# Patient Record
Sex: Female | Born: 1975
Health system: Southern US, Community
[De-identification: ages and names within clinical notes are randomized; demographics above are authoritative.]

## PROBLEM LIST (undated history)

## (undated) DIAGNOSIS — Z87898 Personal history of other specified conditions: Secondary | ICD-10-CM

## (undated) DIAGNOSIS — I10 Essential (primary) hypertension: Secondary | ICD-10-CM

## (undated) DIAGNOSIS — F411 Generalized anxiety disorder: Secondary | ICD-10-CM

## (undated) HISTORY — DX: Generalized anxiety disorder: F41.1

## (undated) HISTORY — DX: Personal history of other specified conditions: Z87.898

## (undated) HISTORY — PX: TUBAL LIGATION: SHX77

## (undated) HISTORY — PX: COLPOSCOPY: SHX161

## (undated) HISTORY — DX: Essential (primary) hypertension: I10

---

## 1997-03-02 ENCOUNTER — Observation Stay (HOSPITAL_COMMUNITY): Admission: AD | Admit: 1997-03-02 | Discharge: 1997-03-03 | Payer: Self-pay | Admitting: Obstetrics & Gynecology

## 1997-03-21 ENCOUNTER — Inpatient Hospital Stay (HOSPITAL_COMMUNITY): Admission: AD | Admit: 1997-03-21 | Discharge: 1997-03-21 | Payer: Self-pay | Admitting: Obstetrics

## 1997-04-09 ENCOUNTER — Encounter: Admission: RE | Admit: 1997-04-09 | Discharge: 1997-04-09 | Payer: Self-pay | Admitting: Family Medicine

## 1997-04-16 ENCOUNTER — Encounter: Admission: RE | Admit: 1997-04-16 | Discharge: 1997-04-16 | Payer: Self-pay | Admitting: Family Medicine

## 1997-04-17 ENCOUNTER — Observation Stay (HOSPITAL_COMMUNITY): Admission: AD | Admit: 1997-04-17 | Discharge: 1997-04-18 | Payer: Self-pay | Admitting: *Deleted

## 1997-04-17 ENCOUNTER — Encounter: Admission: RE | Admit: 1997-04-17 | Discharge: 1997-04-17 | Payer: Self-pay | Admitting: Family Medicine

## 1997-04-20 ENCOUNTER — Encounter: Admission: RE | Admit: 1997-04-20 | Discharge: 1997-04-20 | Payer: Self-pay | Admitting: Family Medicine

## 1997-04-21 ENCOUNTER — Inpatient Hospital Stay (HOSPITAL_COMMUNITY): Admission: AD | Admit: 1997-04-21 | Discharge: 1997-04-23 | Payer: Self-pay

## 1997-05-02 ENCOUNTER — Encounter: Admission: RE | Admit: 1997-05-02 | Discharge: 1997-05-02 | Payer: Self-pay | Admitting: Family Medicine

## 1997-05-30 ENCOUNTER — Encounter: Admission: RE | Admit: 1997-05-30 | Discharge: 1997-05-30 | Payer: Self-pay | Admitting: Family Medicine

## 1997-06-08 ENCOUNTER — Encounter: Admission: RE | Admit: 1997-06-08 | Discharge: 1997-06-08 | Payer: Self-pay | Admitting: Family Medicine

## 1997-06-18 ENCOUNTER — Encounter: Admission: RE | Admit: 1997-06-18 | Discharge: 1997-06-18 | Payer: Self-pay | Admitting: Family Medicine

## 1997-07-25 ENCOUNTER — Encounter: Admission: RE | Admit: 1997-07-25 | Discharge: 1997-07-25 | Payer: Self-pay | Admitting: Family Medicine

## 1997-08-01 ENCOUNTER — Encounter: Admission: RE | Admit: 1997-08-01 | Discharge: 1997-08-01 | Payer: Self-pay | Admitting: Family Medicine

## 1997-08-15 ENCOUNTER — Encounter: Admission: RE | Admit: 1997-08-15 | Discharge: 1997-08-15 | Payer: Self-pay | Admitting: Family Medicine

## 1997-09-18 ENCOUNTER — Encounter: Admission: RE | Admit: 1997-09-18 | Discharge: 1997-09-18 | Payer: Self-pay | Admitting: Family Medicine

## 1997-10-17 ENCOUNTER — Encounter: Admission: RE | Admit: 1997-10-17 | Discharge: 1997-10-17 | Payer: Self-pay | Admitting: Family Medicine

## 1997-10-19 ENCOUNTER — Encounter: Admission: RE | Admit: 1997-10-19 | Discharge: 1997-10-19 | Payer: Self-pay | Admitting: Family Medicine

## 1997-10-23 ENCOUNTER — Encounter: Admission: RE | Admit: 1997-10-23 | Discharge: 1997-10-23 | Payer: Self-pay | Admitting: Sports Medicine

## 1997-10-26 ENCOUNTER — Encounter: Admission: RE | Admit: 1997-10-26 | Discharge: 1997-10-26 | Payer: Self-pay | Admitting: Family Medicine

## 1997-10-31 ENCOUNTER — Encounter: Admission: RE | Admit: 1997-10-31 | Discharge: 1997-10-31 | Payer: Self-pay | Admitting: Family Medicine

## 1997-11-16 ENCOUNTER — Encounter: Admission: RE | Admit: 1997-11-16 | Discharge: 1997-11-16 | Payer: Self-pay | Admitting: Family Medicine

## 1997-12-24 ENCOUNTER — Encounter: Admission: RE | Admit: 1997-12-24 | Discharge: 1997-12-24 | Payer: Self-pay | Admitting: Sports Medicine

## 1998-01-21 ENCOUNTER — Encounter: Admission: RE | Admit: 1998-01-21 | Discharge: 1998-01-21 | Payer: Self-pay | Admitting: Family Medicine

## 1998-01-23 ENCOUNTER — Encounter: Admission: RE | Admit: 1998-01-23 | Discharge: 1998-01-23 | Payer: Self-pay | Admitting: Family Medicine

## 1998-03-04 ENCOUNTER — Encounter: Admission: RE | Admit: 1998-03-04 | Discharge: 1998-03-04 | Payer: Self-pay | Admitting: Family Medicine

## 1998-04-10 ENCOUNTER — Encounter: Admission: RE | Admit: 1998-04-10 | Discharge: 1998-04-10 | Payer: Self-pay | Admitting: Family Medicine

## 1998-04-18 ENCOUNTER — Encounter: Admission: RE | Admit: 1998-04-18 | Discharge: 1998-04-18 | Payer: Self-pay | Admitting: Family Medicine

## 1998-05-06 ENCOUNTER — Encounter: Admission: RE | Admit: 1998-05-06 | Discharge: 1998-05-06 | Payer: Self-pay | Admitting: Family Medicine

## 1998-05-07 ENCOUNTER — Encounter: Admission: RE | Admit: 1998-05-07 | Discharge: 1998-05-07 | Payer: Self-pay | Admitting: Sports Medicine

## 1998-05-14 ENCOUNTER — Encounter: Admission: RE | Admit: 1998-05-14 | Discharge: 1998-05-14 | Payer: Self-pay | Admitting: Family Medicine

## 1998-05-28 ENCOUNTER — Encounter: Admission: RE | Admit: 1998-05-28 | Discharge: 1998-05-28 | Payer: Self-pay | Admitting: Sports Medicine

## 1998-06-04 ENCOUNTER — Encounter: Admission: RE | Admit: 1998-06-04 | Discharge: 1998-06-04 | Payer: Self-pay | Admitting: Family Medicine

## 1998-07-03 ENCOUNTER — Encounter: Admission: RE | Admit: 1998-07-03 | Discharge: 1998-07-03 | Payer: Self-pay | Admitting: Sports Medicine

## 1998-07-29 ENCOUNTER — Encounter: Admission: RE | Admit: 1998-07-29 | Discharge: 1998-07-29 | Payer: Self-pay | Admitting: Family Medicine

## 1998-07-30 ENCOUNTER — Encounter: Admission: RE | Admit: 1998-07-30 | Discharge: 1998-07-30 | Payer: Self-pay | Admitting: Family Medicine

## 1998-08-12 ENCOUNTER — Encounter: Admission: RE | Admit: 1998-08-12 | Discharge: 1998-08-12 | Payer: Self-pay | Admitting: Family Medicine

## 1998-08-14 ENCOUNTER — Encounter: Admission: RE | Admit: 1998-08-14 | Discharge: 1998-08-14 | Payer: Self-pay | Admitting: Family Medicine

## 1998-08-22 ENCOUNTER — Encounter: Admission: RE | Admit: 1998-08-22 | Discharge: 1998-08-22 | Payer: Self-pay | Admitting: Family Medicine

## 1998-09-01 ENCOUNTER — Encounter: Payer: Self-pay | Admitting: Emergency Medicine

## 1998-09-01 ENCOUNTER — Emergency Department (HOSPITAL_COMMUNITY): Admission: EM | Admit: 1998-09-01 | Discharge: 1998-09-01 | Payer: Self-pay | Admitting: Emergency Medicine

## 1998-09-11 ENCOUNTER — Encounter: Admission: RE | Admit: 1998-09-11 | Discharge: 1998-09-11 | Payer: Self-pay | Admitting: Family Medicine

## 1998-09-24 ENCOUNTER — Encounter: Admission: RE | Admit: 1998-09-24 | Discharge: 1998-09-24 | Payer: Self-pay | Admitting: Sports Medicine

## 1998-10-15 ENCOUNTER — Encounter: Admission: RE | Admit: 1998-10-15 | Discharge: 1998-10-15 | Payer: Self-pay | Admitting: Family Medicine

## 1998-10-18 ENCOUNTER — Encounter: Admission: RE | Admit: 1998-10-18 | Discharge: 1998-10-18 | Payer: Self-pay | Admitting: Family Medicine

## 1998-11-15 ENCOUNTER — Encounter: Admission: RE | Admit: 1998-11-15 | Discharge: 1998-11-15 | Payer: Self-pay | Admitting: Family Medicine

## 1998-12-05 ENCOUNTER — Encounter: Admission: RE | Admit: 1998-12-05 | Discharge: 1998-12-05 | Payer: Self-pay | Admitting: Family Medicine

## 1999-01-02 ENCOUNTER — Encounter: Admission: RE | Admit: 1999-01-02 | Discharge: 1999-01-02 | Payer: Self-pay | Admitting: Family Medicine

## 1999-01-23 ENCOUNTER — Encounter: Admission: RE | Admit: 1999-01-23 | Discharge: 1999-01-23 | Payer: Self-pay | Admitting: Family Medicine

## 1999-02-05 ENCOUNTER — Encounter: Admission: RE | Admit: 1999-02-05 | Discharge: 1999-02-05 | Payer: Self-pay | Admitting: Family Medicine

## 1999-03-09 ENCOUNTER — Inpatient Hospital Stay (HOSPITAL_COMMUNITY): Admission: AD | Admit: 1999-03-09 | Discharge: 1999-03-09 | Payer: Self-pay | Admitting: Obstetrics

## 1999-04-04 ENCOUNTER — Inpatient Hospital Stay (HOSPITAL_COMMUNITY): Admission: EM | Admit: 1999-04-04 | Discharge: 1999-04-04 | Payer: Self-pay | Admitting: Obstetrics

## 1999-07-03 ENCOUNTER — Inpatient Hospital Stay (HOSPITAL_COMMUNITY): Admission: EM | Admit: 1999-07-03 | Discharge: 1999-07-03 | Payer: Self-pay | Admitting: Family Medicine

## 2000-03-31 ENCOUNTER — Other Ambulatory Visit: Admission: RE | Admit: 2000-03-31 | Discharge: 2000-03-31 | Payer: Self-pay | Admitting: Family Medicine

## 2001-04-16 ENCOUNTER — Inpatient Hospital Stay (HOSPITAL_COMMUNITY): Admission: AD | Admit: 2001-04-16 | Discharge: 2001-04-16 | Payer: Self-pay | Admitting: Obstetrics and Gynecology

## 2002-05-20 ENCOUNTER — Inpatient Hospital Stay (HOSPITAL_COMMUNITY): Admission: AD | Admit: 2002-05-20 | Discharge: 2002-05-20 | Payer: Self-pay | Admitting: Obstetrics and Gynecology

## 2002-07-14 ENCOUNTER — Inpatient Hospital Stay (HOSPITAL_COMMUNITY): Admission: AD | Admit: 2002-07-14 | Discharge: 2002-07-14 | Payer: Self-pay | Admitting: Family Medicine

## 2002-07-14 ENCOUNTER — Encounter: Payer: Self-pay | Admitting: Family Medicine

## 2002-09-20 ENCOUNTER — Encounter: Payer: Self-pay | Admitting: Emergency Medicine

## 2002-09-20 ENCOUNTER — Emergency Department (HOSPITAL_COMMUNITY): Admission: EM | Admit: 2002-09-20 | Discharge: 2002-09-20 | Payer: Self-pay | Admitting: Emergency Medicine

## 2002-10-09 ENCOUNTER — Ambulatory Visit (HOSPITAL_COMMUNITY): Admission: RE | Admit: 2002-10-09 | Discharge: 2002-10-09 | Payer: Self-pay | Admitting: Obstetrics and Gynecology

## 2002-11-05 ENCOUNTER — Emergency Department (HOSPITAL_COMMUNITY): Admission: AD | Admit: 2002-11-05 | Discharge: 2002-11-05 | Payer: Self-pay | Admitting: Emergency Medicine

## 2002-12-11 ENCOUNTER — Ambulatory Visit (HOSPITAL_COMMUNITY): Admission: RE | Admit: 2002-12-11 | Discharge: 2002-12-11 | Payer: Self-pay | Admitting: *Deleted

## 2002-12-16 ENCOUNTER — Inpatient Hospital Stay (HOSPITAL_COMMUNITY): Admission: AD | Admit: 2002-12-16 | Discharge: 2002-12-16 | Payer: Self-pay | Admitting: *Deleted

## 2002-12-30 ENCOUNTER — Inpatient Hospital Stay (HOSPITAL_COMMUNITY): Admission: AD | Admit: 2002-12-30 | Discharge: 2003-01-03 | Payer: Self-pay | Admitting: Obstetrics and Gynecology

## 2003-01-17 ENCOUNTER — Encounter: Admission: RE | Admit: 2003-01-17 | Discharge: 2003-01-17 | Payer: Self-pay | Admitting: *Deleted

## 2003-02-01 ENCOUNTER — Encounter: Admission: RE | Admit: 2003-02-01 | Discharge: 2003-02-01 | Payer: Self-pay | Admitting: *Deleted

## 2003-02-02 ENCOUNTER — Ambulatory Visit (HOSPITAL_COMMUNITY): Admission: RE | Admit: 2003-02-02 | Discharge: 2003-02-02 | Payer: Self-pay | Admitting: *Deleted

## 2003-02-15 ENCOUNTER — Encounter: Admission: RE | Admit: 2003-02-15 | Discharge: 2003-02-15 | Payer: Self-pay | Admitting: *Deleted

## 2003-02-15 ENCOUNTER — Inpatient Hospital Stay (HOSPITAL_COMMUNITY): Admission: AD | Admit: 2003-02-15 | Discharge: 2003-02-15 | Payer: Self-pay | Admitting: Obstetrics & Gynecology

## 2003-02-17 ENCOUNTER — Observation Stay (HOSPITAL_COMMUNITY): Admission: AD | Admit: 2003-02-17 | Discharge: 2003-02-18 | Payer: Self-pay | Admitting: *Deleted

## 2003-02-22 ENCOUNTER — Encounter: Admission: RE | Admit: 2003-02-22 | Discharge: 2003-02-22 | Payer: Self-pay | Admitting: Family Medicine

## 2003-02-24 ENCOUNTER — Observation Stay (HOSPITAL_COMMUNITY): Admission: AD | Admit: 2003-02-24 | Discharge: 2003-02-25 | Payer: Self-pay | Admitting: Obstetrics & Gynecology

## 2003-02-26 ENCOUNTER — Inpatient Hospital Stay (HOSPITAL_COMMUNITY): Admission: AD | Admit: 2003-02-26 | Discharge: 2003-02-26 | Payer: Self-pay | Admitting: Obstetrics & Gynecology

## 2003-02-26 ENCOUNTER — Encounter: Admission: RE | Admit: 2003-02-26 | Discharge: 2003-02-26 | Payer: Self-pay | Admitting: *Deleted

## 2003-03-01 ENCOUNTER — Encounter: Admission: RE | Admit: 2003-03-01 | Discharge: 2003-03-01 | Payer: Self-pay | Admitting: *Deleted

## 2003-03-02 ENCOUNTER — Inpatient Hospital Stay (HOSPITAL_COMMUNITY): Admission: AD | Admit: 2003-03-02 | Discharge: 2003-03-05 | Payer: Self-pay | Admitting: *Deleted

## 2003-03-04 ENCOUNTER — Encounter (INDEPENDENT_AMBULATORY_CARE_PROVIDER_SITE_OTHER): Payer: Self-pay | Admitting: Specialist

## 2003-03-10 ENCOUNTER — Emergency Department (HOSPITAL_COMMUNITY): Admission: EM | Admit: 2003-03-10 | Discharge: 2003-03-11 | Payer: Self-pay | Admitting: Emergency Medicine

## 2003-04-06 ENCOUNTER — Encounter (INDEPENDENT_AMBULATORY_CARE_PROVIDER_SITE_OTHER): Payer: Self-pay | Admitting: *Deleted

## 2003-04-06 LAB — CONVERTED CEMR LAB

## 2003-06-11 ENCOUNTER — Encounter: Admission: RE | Admit: 2003-06-11 | Discharge: 2003-06-11 | Payer: Self-pay | Admitting: Family Medicine

## 2003-06-23 ENCOUNTER — Emergency Department (HOSPITAL_COMMUNITY): Admission: EM | Admit: 2003-06-23 | Discharge: 2003-06-23 | Payer: Self-pay | Admitting: Family Medicine

## 2003-09-20 ENCOUNTER — Ambulatory Visit: Payer: Self-pay | Admitting: Family Medicine

## 2003-10-11 ENCOUNTER — Ambulatory Visit: Payer: Self-pay | Admitting: Family Medicine

## 2003-10-17 ENCOUNTER — Ambulatory Visit: Payer: Self-pay | Admitting: Family Medicine

## 2003-12-23 ENCOUNTER — Emergency Department (HOSPITAL_COMMUNITY): Admission: EM | Admit: 2003-12-23 | Discharge: 2003-12-23 | Payer: Self-pay | Admitting: Family Medicine

## 2004-04-10 ENCOUNTER — Ambulatory Visit: Payer: Self-pay | Admitting: Sports Medicine

## 2004-05-16 ENCOUNTER — Ambulatory Visit: Payer: Self-pay | Admitting: Family Medicine

## 2004-05-21 ENCOUNTER — Ambulatory Visit: Payer: Self-pay | Admitting: Family Medicine

## 2004-05-21 ENCOUNTER — Encounter: Admission: RE | Admit: 2004-05-21 | Discharge: 2004-05-21 | Payer: Self-pay | Admitting: Sports Medicine

## 2004-08-07 ENCOUNTER — Ambulatory Visit: Payer: Self-pay | Admitting: Family Medicine

## 2004-09-16 ENCOUNTER — Encounter: Admission: RE | Admit: 2004-09-16 | Discharge: 2004-09-16 | Payer: Self-pay | Admitting: Occupational Medicine

## 2004-10-15 ENCOUNTER — Ambulatory Visit: Payer: Self-pay | Admitting: Family Medicine

## 2005-01-12 ENCOUNTER — Ambulatory Visit: Payer: Self-pay | Admitting: Sports Medicine

## 2005-01-16 ENCOUNTER — Encounter: Admission: RE | Admit: 2005-01-16 | Discharge: 2005-01-16 | Payer: Self-pay | Admitting: Sports Medicine

## 2005-04-08 ENCOUNTER — Ambulatory Visit: Payer: Self-pay | Admitting: Family Medicine

## 2005-04-15 ENCOUNTER — Ambulatory Visit: Payer: Self-pay | Admitting: Family Medicine

## 2005-05-18 ENCOUNTER — Ambulatory Visit: Payer: Self-pay | Admitting: Sports Medicine

## 2005-06-23 ENCOUNTER — Ambulatory Visit: Payer: Self-pay | Admitting: Sports Medicine

## 2005-09-22 ENCOUNTER — Ambulatory Visit: Payer: Self-pay | Admitting: Sports Medicine

## 2005-10-05 ENCOUNTER — Ambulatory Visit: Payer: Self-pay | Admitting: Family Medicine

## 2006-01-08 ENCOUNTER — Ambulatory Visit: Payer: Self-pay | Admitting: Family Medicine

## 2006-01-20 ENCOUNTER — Ambulatory Visit: Payer: Self-pay | Admitting: Sports Medicine

## 2006-03-04 DIAGNOSIS — Z87891 Personal history of nicotine dependence: Secondary | ICD-10-CM | POA: Insufficient documentation

## 2006-03-04 DIAGNOSIS — D649 Anemia, unspecified: Secondary | ICD-10-CM | POA: Insufficient documentation

## 2006-03-04 DIAGNOSIS — J309 Allergic rhinitis, unspecified: Secondary | ICD-10-CM | POA: Insufficient documentation

## 2006-03-04 DIAGNOSIS — E669 Obesity, unspecified: Secondary | ICD-10-CM | POA: Insufficient documentation

## 2006-03-04 DIAGNOSIS — I10 Essential (primary) hypertension: Secondary | ICD-10-CM | POA: Insufficient documentation

## 2006-03-05 ENCOUNTER — Encounter (INDEPENDENT_AMBULATORY_CARE_PROVIDER_SITE_OTHER): Payer: Self-pay | Admitting: *Deleted

## 2006-04-19 ENCOUNTER — Telehealth: Payer: Self-pay | Admitting: *Deleted

## 2006-04-30 ENCOUNTER — Ambulatory Visit: Payer: Self-pay | Admitting: Family Medicine

## 2006-04-30 ENCOUNTER — Telehealth (INDEPENDENT_AMBULATORY_CARE_PROVIDER_SITE_OTHER): Payer: Self-pay | Admitting: *Deleted

## 2006-04-30 DIAGNOSIS — L732 Hidradenitis suppurativa: Secondary | ICD-10-CM | POA: Insufficient documentation

## 2006-04-30 LAB — CONVERTED CEMR LAB

## 2006-08-16 ENCOUNTER — Encounter: Payer: Self-pay | Admitting: Family Medicine

## 2006-08-16 ENCOUNTER — Ambulatory Visit: Payer: Self-pay | Admitting: Family Medicine

## 2006-08-16 ENCOUNTER — Telehealth: Payer: Self-pay | Admitting: *Deleted

## 2006-08-16 DIAGNOSIS — A6000 Herpesviral infection of urogenital system, unspecified: Secondary | ICD-10-CM | POA: Insufficient documentation

## 2006-08-16 LAB — CONVERTED CEMR LAB
KOH Prep: NEGATIVE
Whiff Test: NEGATIVE

## 2006-08-19 LAB — CONVERTED CEMR LAB: GC Probe Amp, Genital: NEGATIVE

## 2006-08-22 ENCOUNTER — Telehealth: Payer: Self-pay | Admitting: Family Medicine

## 2006-08-22 ENCOUNTER — Emergency Department (HOSPITAL_COMMUNITY): Admission: EM | Admit: 2006-08-22 | Discharge: 2006-08-22 | Payer: Self-pay | Admitting: Emergency Medicine

## 2006-08-24 ENCOUNTER — Ambulatory Visit: Payer: Self-pay | Admitting: Family Medicine

## 2006-08-24 LAB — CONVERTED CEMR LAB: Whiff Test: NEGATIVE

## 2006-09-28 ENCOUNTER — Telehealth: Payer: Self-pay | Admitting: Family Medicine

## 2006-10-14 ENCOUNTER — Ambulatory Visit: Payer: Self-pay | Admitting: Family Medicine

## 2006-11-22 ENCOUNTER — Ambulatory Visit: Payer: Self-pay | Admitting: Family Medicine

## 2007-02-16 ENCOUNTER — Ambulatory Visit: Payer: Self-pay | Admitting: Family Medicine

## 2007-02-16 ENCOUNTER — Encounter (INDEPENDENT_AMBULATORY_CARE_PROVIDER_SITE_OTHER): Payer: Self-pay | Admitting: Family Medicine

## 2007-02-16 ENCOUNTER — Telehealth: Payer: Self-pay | Admitting: *Deleted

## 2007-02-16 LAB — CONVERTED CEMR LAB: Heterophile Ab Screen: NEGATIVE

## 2007-02-17 LAB — CONVERTED CEMR LAB
Basophils Absolute: 0 10*3/uL (ref 0.0–0.1)
Calcium: 9.5 mg/dL (ref 8.4–10.5)
Eosinophils Absolute: 0.2 10*3/uL (ref 0.0–0.7)
Eosinophils Relative: 5 % (ref 0–5)
HCT: 37.6 % (ref 36.0–46.0)
MCV: 94 fL (ref 78.0–100.0)
Neutrophils Relative %: 53 % (ref 43–77)
Platelets: 261 10*3/uL (ref 150–400)
Potassium: 3.6 meq/L (ref 3.5–5.3)
RDW: 13.3 % (ref 11.5–15.5)
Sodium: 140 meq/L (ref 135–145)
TSH: 0.514 microintl units/mL (ref 0.350–5.50)

## 2007-03-23 ENCOUNTER — Telehealth: Payer: Self-pay | Admitting: Family Medicine

## 2007-05-02 ENCOUNTER — Ambulatory Visit: Payer: Self-pay | Admitting: Family Medicine

## 2007-05-02 DIAGNOSIS — F411 Generalized anxiety disorder: Secondary | ICD-10-CM | POA: Insufficient documentation

## 2007-05-23 ENCOUNTER — Encounter: Payer: Self-pay | Admitting: Family Medicine

## 2007-05-23 ENCOUNTER — Telehealth: Payer: Self-pay | Admitting: *Deleted

## 2007-05-23 ENCOUNTER — Ambulatory Visit: Payer: Self-pay | Admitting: Family Medicine

## 2007-05-23 LAB — CONVERTED CEMR LAB
Bilirubin Urine: NEGATIVE
Glucose, Urine, Semiquant: NEGATIVE
Ketones, urine, test strip: NEGATIVE
pH: 8

## 2007-05-24 LAB — CONVERTED CEMR LAB
Chlamydia, DNA Probe: NEGATIVE
GC Probe Amp, Genital: NEGATIVE

## 2007-05-27 ENCOUNTER — Telehealth: Payer: Self-pay | Admitting: Family Medicine

## 2007-05-27 ENCOUNTER — Ambulatory Visit (HOSPITAL_COMMUNITY): Admission: RE | Admit: 2007-05-27 | Discharge: 2007-05-27 | Payer: Self-pay | Admitting: Family Medicine

## 2007-06-06 ENCOUNTER — Telehealth: Payer: Self-pay | Admitting: Family Medicine

## 2007-06-21 ENCOUNTER — Ambulatory Visit: Payer: Self-pay | Admitting: Family Medicine

## 2007-07-11 ENCOUNTER — Ambulatory Visit: Payer: Self-pay | Admitting: Family Medicine

## 2007-07-11 LAB — CONVERTED CEMR LAB
Blood in Urine, dipstick: NEGATIVE
Nitrite: NEGATIVE
Specific Gravity, Urine: 1.02
WBC Urine, dipstick: NEGATIVE

## 2007-09-28 ENCOUNTER — Ambulatory Visit: Payer: Self-pay | Admitting: Family Medicine

## 2007-12-08 ENCOUNTER — Telehealth: Payer: Self-pay | Admitting: *Deleted

## 2007-12-09 ENCOUNTER — Encounter (INDEPENDENT_AMBULATORY_CARE_PROVIDER_SITE_OTHER): Payer: Self-pay | Admitting: Family Medicine

## 2007-12-09 ENCOUNTER — Ambulatory Visit: Payer: Self-pay | Admitting: Family Medicine

## 2007-12-09 LAB — CONVERTED CEMR LAB: Chlamydia, DNA Probe: NEGATIVE

## 2007-12-13 ENCOUNTER — Encounter (INDEPENDENT_AMBULATORY_CARE_PROVIDER_SITE_OTHER): Payer: Self-pay | Admitting: Family Medicine

## 2007-12-20 ENCOUNTER — Ambulatory Visit: Payer: Self-pay | Admitting: Family Medicine

## 2007-12-20 ENCOUNTER — Encounter (INDEPENDENT_AMBULATORY_CARE_PROVIDER_SITE_OTHER): Payer: Self-pay | Admitting: Family Medicine

## 2007-12-20 LAB — CONVERTED CEMR LAB
AST: 16 units/L (ref 0–37)
Albumin: 4.2 g/dL (ref 3.5–5.2)
BUN: 11 mg/dL (ref 6–23)
CO2: 20 meq/L (ref 19–32)
Calcium: 8.8 mg/dL (ref 8.4–10.5)
Chloride: 105 meq/L (ref 96–112)
HDL: 49 mg/dL (ref >39)
Potassium: 4.2 meq/L (ref 3.5–5.3)

## 2007-12-21 ENCOUNTER — Encounter (INDEPENDENT_AMBULATORY_CARE_PROVIDER_SITE_OTHER): Payer: Self-pay | Admitting: Family Medicine

## 2008-01-16 ENCOUNTER — Ambulatory Visit: Payer: Self-pay | Admitting: Family Medicine

## 2008-01-19 ENCOUNTER — Telehealth: Payer: Self-pay | Admitting: *Deleted

## 2008-01-19 ENCOUNTER — Encounter (INDEPENDENT_AMBULATORY_CARE_PROVIDER_SITE_OTHER): Payer: Self-pay | Admitting: Family Medicine

## 2008-01-19 ENCOUNTER — Ambulatory Visit: Payer: Self-pay | Admitting: Family Medicine

## 2008-01-31 ENCOUNTER — Encounter (INDEPENDENT_AMBULATORY_CARE_PROVIDER_SITE_OTHER): Payer: Self-pay | Admitting: Family Medicine

## 2008-02-21 ENCOUNTER — Ambulatory Visit: Payer: Self-pay | Admitting: Family Medicine

## 2008-02-21 DIAGNOSIS — B351 Tinea unguium: Secondary | ICD-10-CM | POA: Insufficient documentation

## 2008-02-29 ENCOUNTER — Telehealth (INDEPENDENT_AMBULATORY_CARE_PROVIDER_SITE_OTHER): Payer: Self-pay | Admitting: Family Medicine

## 2008-03-06 ENCOUNTER — Telehealth: Payer: Self-pay | Admitting: *Deleted

## 2008-03-26 ENCOUNTER — Ambulatory Visit: Payer: Self-pay | Admitting: Family Medicine

## 2008-03-26 LAB — CONVERTED CEMR LAB: Rapid Strep: NEGATIVE

## 2008-05-15 ENCOUNTER — Telehealth (INDEPENDENT_AMBULATORY_CARE_PROVIDER_SITE_OTHER): Payer: Self-pay | Admitting: Family Medicine

## 2008-05-15 ENCOUNTER — Ambulatory Visit: Payer: Self-pay | Admitting: Family Medicine

## 2008-05-15 ENCOUNTER — Encounter (INDEPENDENT_AMBULATORY_CARE_PROVIDER_SITE_OTHER): Payer: Self-pay | Admitting: Family Medicine

## 2008-05-15 LAB — CONVERTED CEMR LAB
Glucose, Urine, Semiquant: NEGATIVE
Ketones, urine, test strip: NEGATIVE
Specific Gravity, Urine: 1.02
pH: 8

## 2008-06-14 ENCOUNTER — Encounter (INDEPENDENT_AMBULATORY_CARE_PROVIDER_SITE_OTHER): Payer: Self-pay | Admitting: Family Medicine

## 2008-09-25 ENCOUNTER — Encounter: Payer: Self-pay | Admitting: Family Medicine

## 2008-09-25 ENCOUNTER — Telehealth (INDEPENDENT_AMBULATORY_CARE_PROVIDER_SITE_OTHER): Payer: Self-pay | Admitting: *Deleted

## 2008-09-25 ENCOUNTER — Ambulatory Visit: Payer: Self-pay | Admitting: Family Medicine

## 2008-09-25 DIAGNOSIS — N83209 Unspecified ovarian cyst, unspecified side: Secondary | ICD-10-CM | POA: Insufficient documentation

## 2008-09-25 DIAGNOSIS — R809 Proteinuria, unspecified: Secondary | ICD-10-CM | POA: Insufficient documentation

## 2008-09-25 DIAGNOSIS — R1031 Right lower quadrant pain: Secondary | ICD-10-CM | POA: Insufficient documentation

## 2008-09-25 LAB — CONVERTED CEMR LAB
Beta hcg, urine, semiquantitative: NEGATIVE
Bilirubin Urine: NEGATIVE
Blood in Urine, dipstick: NEGATIVE
Chlamydia, DNA Probe: NEGATIVE
GC Probe Amp, Genital: NEGATIVE
Ketones, urine, test strip: NEGATIVE
Urobilinogen, UA: 0.2
Whiff Test: NEGATIVE

## 2008-10-01 ENCOUNTER — Ambulatory Visit (HOSPITAL_COMMUNITY): Admission: RE | Admit: 2008-10-01 | Discharge: 2008-10-01 | Payer: Self-pay | Admitting: Family Medicine

## 2008-10-09 ENCOUNTER — Telehealth: Payer: Self-pay | Admitting: Family Medicine

## 2008-10-11 ENCOUNTER — Ambulatory Visit: Payer: Self-pay | Admitting: Family Medicine

## 2008-10-15 ENCOUNTER — Telehealth: Payer: Self-pay | Admitting: *Deleted

## 2008-10-16 ENCOUNTER — Ambulatory Visit: Payer: Self-pay | Admitting: Family Medicine

## 2008-10-18 ENCOUNTER — Ambulatory Visit: Payer: Self-pay | Admitting: Family Medicine

## 2009-01-25 ENCOUNTER — Ambulatory Visit: Payer: Self-pay | Admitting: Family Medicine

## 2009-01-25 ENCOUNTER — Encounter: Payer: Self-pay | Admitting: Family Medicine

## 2009-01-25 LAB — CONVERTED CEMR LAB: GC Probe Amp, Genital: NEGATIVE

## 2009-01-28 ENCOUNTER — Encounter: Payer: Self-pay | Admitting: Family Medicine

## 2009-02-07 ENCOUNTER — Telehealth: Payer: Self-pay | Admitting: Family Medicine

## 2009-03-27 ENCOUNTER — Encounter: Payer: Self-pay | Admitting: Family Medicine

## 2009-03-27 ENCOUNTER — Ambulatory Visit: Payer: Self-pay | Admitting: Family Medicine

## 2009-03-27 LAB — CONVERTED CEMR LAB
GC Probe Amp, Genital: NEGATIVE
Rapid Strep: NEGATIVE

## 2009-04-15 ENCOUNTER — Emergency Department (HOSPITAL_COMMUNITY): Admission: EM | Admit: 2009-04-15 | Discharge: 2009-04-15 | Payer: Self-pay | Admitting: Family Medicine

## 2009-05-05 ENCOUNTER — Emergency Department (HOSPITAL_COMMUNITY): Admission: EM | Admit: 2009-05-05 | Discharge: 2009-05-05 | Payer: Self-pay | Admitting: Emergency Medicine

## 2009-07-02 ENCOUNTER — Ambulatory Visit: Payer: Self-pay | Admitting: Family Medicine

## 2009-07-02 ENCOUNTER — Encounter (INDEPENDENT_AMBULATORY_CARE_PROVIDER_SITE_OTHER): Payer: Self-pay | Admitting: Family Medicine

## 2009-07-02 LAB — CONVERTED CEMR LAB
ALT: 18 units/L (ref 0–35)
AST: 19 units/L (ref 0–37)
Chloride: 104 meq/L (ref 96–112)
Creatinine, Ser: 0.74 mg/dL (ref 0.40–1.20)
HCT: 35.4 % — ABNORMAL LOW (ref 36.0–46.0)
Platelets: 264 10*3/uL (ref 150–400)
RDW: 13.6 % (ref 11.5–15.5)
Total Bilirubin: 0.3 mg/dL (ref 0.3–1.2)

## 2009-07-16 ENCOUNTER — Encounter: Payer: Self-pay | Admitting: Sports Medicine

## 2009-07-16 ENCOUNTER — Ambulatory Visit: Payer: Self-pay | Admitting: Family Medicine

## 2009-07-16 LAB — CONVERTED CEMR LAB
Bilirubin Urine: NEGATIVE
Glucose, Urine, Semiquant: NEGATIVE
Ketones, urine, test strip: NEGATIVE
Nitrite: NEGATIVE
Protein, U semiquant: 30
Specific Gravity, Urine: >=1.03
Urobilinogen, UA: 0.2
pH: 6

## 2009-07-18 ENCOUNTER — Telehealth: Payer: Self-pay | Admitting: *Deleted

## 2009-07-25 ENCOUNTER — Ambulatory Visit: Payer: Self-pay | Admitting: Family Medicine

## 2009-07-25 LAB — CONVERTED CEMR LAB
Bilirubin Urine: NEGATIVE
Glucose, Urine, Semiquant: NEGATIVE
Ketones, urine, test strip: NEGATIVE
Nitrite: NEGATIVE
Protein, U semiquant: 30
Specific Gravity, Urine: 1.025
Urobilinogen, UA: 1
WBC Urine, dipstick: NEGATIVE
Whiff Test: NEGATIVE
pH: 6

## 2009-07-29 ENCOUNTER — Ambulatory Visit (HOSPITAL_COMMUNITY): Admission: RE | Admit: 2009-07-29 | Discharge: 2009-07-29 | Payer: Self-pay | Admitting: Sports Medicine

## 2009-08-05 ENCOUNTER — Telehealth: Payer: Self-pay | Admitting: Family Medicine

## 2009-08-08 ENCOUNTER — Ambulatory Visit: Payer: Self-pay | Admitting: Family Medicine

## 2009-08-08 DIAGNOSIS — R42 Dizziness and giddiness: Secondary | ICD-10-CM | POA: Insufficient documentation

## 2009-08-08 LAB — CONVERTED CEMR LAB
BUN: 11 mg/dL (ref 6–23)
Bilirubin Urine: NEGATIVE
Calcium: 9.5 mg/dL (ref 8.4–10.5)
Creatinine, Ser: 0.87 mg/dL (ref 0.40–1.20)
Hemoglobin: 11.6 g/dL — ABNORMAL LOW (ref 12.0–15.0)
MCHC: 34 g/dL (ref 30.0–36.0)
MCV: 93.9 fL (ref 78.0–100.0)
Potassium: 3.5 meq/L (ref 3.5–5.3)
Protein, U semiquant: 30
RDW: 13.6 % (ref 11.5–15.5)
TSH: 0.607 microintl units/mL (ref 0.350–4.500)
Urobilinogen, UA: 1
pH: 5.5

## 2009-10-31 ENCOUNTER — Emergency Department (HOSPITAL_COMMUNITY): Admission: EM | Admit: 2009-10-31 | Discharge: 2009-10-31 | Payer: Self-pay | Admitting: Family Medicine

## 2009-10-31 ENCOUNTER — Ambulatory Visit: Payer: Self-pay | Admitting: Family Medicine

## 2009-11-20 ENCOUNTER — Emergency Department (HOSPITAL_COMMUNITY)
Admission: EM | Admit: 2009-11-20 | Discharge: 2009-11-21 | Payer: Self-pay | Source: Home / Self Care | Admitting: Emergency Medicine

## 2009-11-20 ENCOUNTER — Emergency Department (HOSPITAL_COMMUNITY)
Admission: EM | Admit: 2009-11-20 | Discharge: 2009-11-20 | Disposition: A | Discharge status: Other Acute Inpatient Hospital | Payer: Self-pay | Source: Home / Self Care | Admitting: Family Medicine

## 2009-11-22 ENCOUNTER — Ambulatory Visit: Payer: Self-pay | Admitting: Family Medicine

## 2009-11-22 DIAGNOSIS — F3289 Other specified depressive episodes: Secondary | ICD-10-CM | POA: Insufficient documentation

## 2009-11-22 DIAGNOSIS — F329 Major depressive disorder, single episode, unspecified: Secondary | ICD-10-CM | POA: Insufficient documentation

## 2010-02-04 NOTE — Assessment & Plan Note (Signed)
Summary: recheck urine for blood/walden,tcb   Vital Signs:  Patient profile:   35 year old female Height:      68.5 inches Weight:      260 pounds BMI:     39.10 Temp:     99.0 degrees F oral Pulse rate:   94 / minute BP sitting:   131 / 83  (left arm) Cuff size:   large  Vitals Entered By: Tessie Fass CMA (August 08, 2009 8:37 AM) CC: recheck for hematuria Is Patient Diabetic? No Pain Assessment Patient in pain? no        Primary Care Provider:  Renold Don MD  CC:  recheck for hematuria.  History of Present Illness: Pt reports she is walking 30 minutes a day and has lost 6 lbs.  Was walking yesterday and felt faint.  Also if she stands quickly after bending, then she feels dizzy.  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   She is trying to lose weight.  Drinking more water.  Less soda and sugary foods.  Less sodium. Walking. Has regular menses.  No heavy bleeding.  s/p BTL. Pt reports she never finished the Keflex, but she is doing fine without urinary complaints.  No dysruia, no gross hematuria.    Habits & Providers  Alcohol-Tobacco-Diet     Tobacco Status: quit > 6 months     Tobacco Counseling: to remain off tobacco products     Year Quit: 2010  Current Medications (verified): 1)  Valtrex 500 Mg Tabs (Valacyclovir Hcl) .Marland Kitchen.. 1 Tablet By Mouth Daily For Suppression and 1 Tablet By Mouth Two Times A Day X 3 Days For Outbreaks 2)  Lisinopril-Hydrochlorothiazide 20-25 Mg Tabs (Lisinopril-Hydrochlorothiazide) .... One Tab By Mouth Daily 3)  Fluconazole 150 Mg Tabs (Fluconazole) .... One Tab By Mouth Q3d X 2, Then By Mouth Q Weekly X 6 Months.  Allergies (verified): No Known Drug Allergies  Social History: Smoking Status:  quit > 6 months  Review of Systems       see HPI  Physical Exam  General:  Obese,in no acute distress; alert,appropriate and cooperative throughout examination Head:  Normocephalic and atraumatic without  obvious abnormalities. No apparent alopecia or balding. Eyes:  Pale conjunctiva Lungs:  Normal respiratory effort, chest expands symmetrically. Lungs are clear to auscultation, no crackles or wheezes. Heart:  Normal rate and regular rhythm. S1 and S2 normal without gallop, murmur, click, rub or other extra sounds. Neurologic:  CN II-XII intact except VF not assessed.  Strength 5/5, DTRs 2+ throughout without clonus.  Nl finger-nose, RAM, nl heel/toe/tandem gait.  Neg Romberg, no pronator drift.     Impression & Recommendations:  Problem # 1:  ORTHOSTATIC DIZZINESS (ICD-780.4) Check labs today. May be simply due to dietary changes and the heat.  If labs normal, then will reassure. The following medications were removed from the medication list:    Claritin 10 Mg Tabs (Loratadine) ..... Once daily as needed allergy  Orders: Basic Met-[facility] [phone]) CBC-[facility] (46962) TSH-[facility] [phone]) [facility]- Est Level  3 (01027)  Problem # 2:  MICROSCOPIC HEMATURIA (ICD-599.72)  Resolved.   The following medications were removed from the medication list:    Keflex 500 Mg Caps (Cephalexin) ..... One tab po two times a day x 7d    Metronidazole 500 Mg Tabs (Metronidazole) ..... One tab by mouth two times a day x 7d  Orders: Urinalysis-[facility] (00000) [facility]- Est Level  3 (25366)  Complete Medication List: 1)  Valtrex 500 Mg Tabs (Valacyclovir hcl) .Marland Kitchen.. 1 tablet by mouth daily for suppression and 1 tablet by mouth two times a day x 3 days for outbreaks 2)  Lisinopril-hydrochlorothiazide 20-25 Mg Tabs (Lisinopril-hydrochlorothiazide) .... One tab by mouth daily 3)  Fluconazole 150 Mg Tabs (Fluconazole) .... One tab by mouth q3d x 2, then by mouth q weekly x 6 months.  Other Orders: U Preg-[facility] ([address])  Patient Instructions: 1)  Congratulations on the weight loss! 2)  Your urine looks fine.  I think the blood was probably just from the urine infection, and it is gone now. 3)  We will check some blood  work for the dizziness.  Let us know if it gets worse.  In the meantime, be careful when you stand up and be sure there is something you can hold on to.  Laboratory Results   Urine Tests  Date/Time Received: August 08, 2009 8:42 AM  Date/Time Reported: August 08, 2009 9:12 AM   Routine Urinalysis   Color: dark yellow Appearance: Clear Glucose: negative   (Normal Range: Negative) Bilirubin: small;  reflex ictotest = negative   (Normal Range: Negative) Ketone: trace (5)   (Normal Range: Negative) Spec. Gravity: >=1.030   (Normal Range: 1.003-1.035) Blood: negative   (Normal Range: Negative) pH: 5.5   (Normal Range: 5.0-8.0) Protein: 30   (Normal Range: Negative) Urobilinogen: 1.0   (Normal Range: 0-1) Nitrite: negative   (Normal Range: Negative) Leukocyte Esterace: negative   (Normal Range: Negative)  Urine Microscopic WBC/HPF: 0-3 RBC/HPF: rare Bacteria/HPF: 2+ Mucous/HPF: 2+ Epithelial/HPF: 5-10 Other: 2+ amorphous    Urine HCG: negative Comments: ...............test performed by......Marland KitchenBonnie A. Swazil[place], MLS (ASCP)cm

## 2010-02-04 NOTE — Progress Notes (Signed)
Summary: meds  Phone Note Call from Patient Call back at Home Phone [phone]   Caller: Patient Summary of Call: has a yeast inf from antibiotic and needs some diflucan called in to [facility] [address] has tried OTC and that makes it worse Initial call taken by: De Nurse,  July 18, 2009 4:15 PM  Follow-up for Phone Call        to md who rx the med Follow-up by: Golden Circle RN,  July 18, 2009 4:25 PM  Additional Follow-up for Phone Call Additional follow up Details #1::        Will call in diflucan 150 by mouth x1, if no better she needs to be seen to confirm dx. Additional Follow-up by: Rodney Langton MD,  July 18, 2009 5:38 PM    Additional Follow-up for Phone Call Additional follow up Details #2::    pt informed Follow-up by: Golden Circle RN,  July 19, 2009 8:38 AM  New/Updated Medications: FLUCONAZOLE 150 MG TABS (FLUCONAZOLE) One tab by mouth x1 Prescriptions: FLUCONAZOLE 150 MG TABS (FLUCONAZOLE) One tab by mouth x1  #1 x 0   Entered and Authorized by:   Rodney Langton MD   Signed by:   Rodney Langton MD on 07/18/2009   Method used:   Electronically to        [facility] [phone]* (retail)       [address]       [place], [place]  [address]       Ph: [phone]       Fax: [phone]   RxID:   [id number]

## 2010-02-04 NOTE — Assessment & Plan Note (Signed)
Summary: ingrown toe & UTI?,df   Vital Signs:  Patient profile:   35 year old female Height:      68.5 inches Weight:      248 pounds BMI:     37.29 Temp:     98.2 degrees F oral Pulse rate:   86 / minute BP sitting:   124 / 79  (left arm) Cuff size:   large  Vitals Entered By: Tessie Fass, CMA CC: ingrown toenail, std check Is Patient Diabetic? No Pain Assessment Patient in pain? yes     Location: toe Intensity: 3   Primary Care Provider:  Renold Don MD  CC:  ingrown toenail and std check.  History of Present Illness: Kelsey Zavala comes in today for ingrown toenail and STD check. 1) Toenail - Had ingrown nail on left foot once and was taken off at [facility].  Lateral edge of great toenail on right foot bothering her.  No redness or discharge but nail difficult to trim and very tender.  2) STD check - white, malodorous vaginal discharge for 3 days.  Same partner for 10 years, has unprotected sex. NO abdominal pain.  No fevers.   Habits & Providers  Alcohol-Tobacco-Diet     Tobacco Status: never  Allergies: No Known Drug Allergies  Social History: Smoking Status:  never  Physical Exam  General:  overweight female, NAD. vitals reviewed.  Abdomen:  soft, nontender Genitalia:  normal introitus, no external lesions, mucosa pink and moist, no vaginal or cervical lesions, no vaginal atrophy, and no friaility or hemorrhage.  Moderate amount of white discharge in vaginal vault.  Extremities:  Rigth great toe nail with lateral edge ingrown.  No erythema, swelling, fluctuance, or discharge but is tender to palpation.     Impression & Recommendations:  Problem # 1:  INGROWN TOENAIL (ICD-703.0) Assessment New  Not infected.  Unable to take off today.  Precepted with Dr. Jennette Zavala.  Patient can only come on Fridays.  Schedule checked and Dr. Leveda Zavala recommended.  Patient will schedule appt with Dr. Leveda Zavala for next Friday.  IN meantime, will apply antibiotic ointment and  wrap with gauze while wearing shoes and socks to prevent infection, keep nail soft.  Leave open to air when at home in evening.    Orders: [facility]- Est  Level 4 ([address]696)  Problem # 2:  BACTERIAL VAGINITIS (ICD-616.10) Assessment: New  Wet prep shows BV.  Flagyl for 7 days.  GC/Chlamydia pending.  Her updated medication list for this problem includes:    Metronidazole 500 Mg Tabs (Metronidazole) .Marland Kitchen... 1 tab by mouth two times a day for 7 days  Orders: [facility]- Est  Level 4 (29528)  Complete Medication List: 1)  Hydrochlorothiazide 25 Mg Tabs (Hydrochlorothiazide) .... Take one tablet daily for blood pressure 2)  Claritin 10 Mg Tabs (Loratadine) .... Once daily as needed allergy 3)  Norvasc 5 Mg Tabs (Amlodipine besylate) .Marland Kitchen.. 1 tablet by mouth daily 4)  Valtrex 500 Mg Tabs (Valacyclovir hcl) .Marland Kitchen.. 1 tablet by mouth daily for suppression and 1 tablet by mouth two times a day x 3 days for outbreaks 5)  Fluticasone Propionate 50 Mcg/act Susp (Fluticasone propionate) .... 2 sprays each nostril daily - disp 1 device 6)  Metronidazole 500 Mg Tabs (Metronidazole) .Marland Kitchen.. 1 tab by mouth two times a day for 7 days  Other Orders: GC/Chlamydia-[facility] (87591/87491) HIV-[facility] ([id number]) [facility]PR[facility] [phone]) [facility]  Patient Instructions: 1)  To have your toenail removed, make an  appointment with Dr. Leveda Zavala next Friday afternoon. 2)  Keep the ointment on your foot when wearing shoes and keep it wrapped.  WHen at home, leave exposed to the air.  3)  We will notify you of your STD test results when we get them next Monday or Tuesday.  Prescriptions: METRONIDAZOLE 500 MG TABS (METRONIDAZOLE) 1 tab by mouth two times a day for 7 days  #14 x 0   Entered and Authorized by:   Kelsey Garland  MD   Signed by:   Kelsey Garland  MD on 01/25/2009   Method used:   Print then Give to Patient   RxID:   [id number]   Laboratory Results  Date/Time Received: January 25, 2009 4:31 PM  Date/Time Reported:  January 25, 2009 4:35 PM   [employer] Source: VAGINAL WBC/hpf: 1-5 Bacteria/hpf: 3+  Cocci Clue cells/hpf: many  Positive whiff Yeast/hpf: none Trichomonas/hpf: none Comments: ...........test performed by...........Marland KitchenTerese Door, CMA

## 2010-02-04 NOTE — Letter (Signed)
Summary: Handout Printed  Printed Handout:  - Hematuria (Blood in the Urine), Adult 

## 2010-02-04 NOTE — Letter (Signed)
Summary: Generic Letter  [facility]  [address]   [place], [place] [address]   Phone: [phone]  Fax: [phone]    01/28/2009  Kelsey Zavala [address] [place], [place]  [address]  Dear Ms. Hagy,  I am happy to inform you that your STD tests were all negative.  You do not have gonorrhea, chlamydia, HIV, or syphillis.  If you have any questions, please call our office.          Sincerely,   Ardeen Garland  MD  Appended Document: Generic Letter mailed.

## 2010-02-04 NOTE — Progress Notes (Signed)
Summary: Rx Req  Phone Note Refill Request Call back at Home Phone [phone] Message from:  Patient  Refills Requested: Medication #1:  HYDROCHLOROTHIAZIDE 25 MG  TABS Take one tablet daily for blood pressure  Medication #2:  NORVASC 5 MG TABS 1 tablet by mouth daily  Medication #3:  VALTREX 500 MG TABS 1 tablet by mouth daily for suppression and 1 tablet by mouth two times a day x 3 days for outbreaks PT USES [facility].  Initial call taken by: Clydell Hakim,  February 07, 2009 3:05 PM  Follow-up for Phone Call        will forward to MD. Follow-up by: Theresia Lo RN,  February 07, 2009 4:42 PM  Additional Follow-up for Phone Call Additional follow up Details #1::        Will refill 1 month's worth, but need to see her before next prescription given Additional Follow-up by: Renold Don MD,  February 08, 2009 1:41 PM    Prescriptions: VALTREX 500 MG TABS (VALACYCLOVIR HCL) 1 tablet by mouth daily for suppression and 1 tablet by mouth two times a day x 3 days for outbreaks  #60 x 0   Entered and Authorized by:   Renold Don MD   Signed by:   Renold Don MD on 02/08/2009   Method used:   Print then Give to Patient   RxID:   [id number] HYDROCHLOROTHIAZIDE 25 MG  TABS (HYDROCHLOROTHIAZIDE) Take one tablet daily for blood pressure  #90 x 0   Entered and Authorized by:   Renold Don MD   Signed by:   Renold Don MD on 02/08/2009   Method used:   Print then Give to Patient   RxID:   [id number] NORVASC 5 MG TABS (AMLODIPINE BESYLATE) 1 tablet by mouth daily  #90 x 0   Entered and Authorized by:   Renold Don MD   Signed by:   Renold Don MD on 02/08/2009   Method used:   Print then Give to Patient   RxID:   [id number]   Appended Document: Rx Req HCTZ and Norvasc called to [facility]. they do not have valtrex. patient notified. she does not want it called anywhere else now. she will come by to pick up RX printed and signed by Dr. Gwendolyn Grant.

## 2010-02-04 NOTE — Assessment & Plan Note (Signed)
Summary: depression/walden/bmc   Vital Signs:  Patient profile:   35 year old female Weight:      269.1 pounds Temp:     98.7 degrees F oral Pulse rate:   98 / minute Pulse rhythm:   regular BP sitting:   155 / 78  (left arm) Cuff size:   large  Vitals Entered By: Loralee Pacas CMA (November 22, 2009 9:52 AM)  Serial Vital Signs/Assessments:  Time      Position  BP       Pulse  Resp  Temp     By                     135/78                         Doralee Albino MD  CC: depression   Primary Care Provider:  Renold Don MD  CC:  depression.  History of Present Illness: Very difficult life.  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 No time or money for counciling. Has taken welbutrin in the past with good response and no side effects.  Current Medications (verified): 1)  Valtrex 500 Mg Tabs (Valacyclovir Hcl) .Marland Kitchen.. 1 Tablet By Mouth Daily For Suppression and 1 Tablet By Mouth Two Times A Day X 3 Days For Outbreaks 2)  Lisinopril-Hydrochlorothiazide 20-25 Mg Tabs (Lisinopril-Hydrochlorothiazide) .... One Tab By Mouth Daily 3)  Fluconazole 150 Mg Tabs (Fluconazole) .... One Tab By Mouth Q3d X 2, Then By Mouth Q Weekly X 6 Months. 4)  Bupropion Hcl 75 Mg Tabs (Bupropion Hcl) .... One By Mouth Daily X 1 Week Then One By Mouth Two Times A Day X 1 Week Then One By Mouth Three Times A Day Thereafter  Allergies (verified): No Known Drug Allergies  Physical Exam  General:  Well-developed,well-nourished,in no acute distress; alert,appropriate and cooperative throughout examination Psych:  Flat affect, tearful.  No delusions or hallucinations.  Wants help   Impression & Recommendations:  Problem # 1:  DEPRESSIVE DISORDER NOT ELSEWHERE CLASSIFIED (ICD-311)  Start bupropion.  FU two weeks with primary MD. Her updated medication list for this problem includes:    Bupropion Hcl 75 Mg Tabs (Bupropion hcl) ..... One by mouth  daily x 1 week then one by mouth two times a day x 1 week then one by mouth three times a day thereafter  Orders: [facility]- Est Level  3 ([address])  Complete Medication List: 1)  Valtrex 500 Mg Tabs (Valacyclovir hcl) .Marland Kitchen.. 1 tablet by mouth daily for suppression and 1 tablet by mouth two times a day x 3 days for outbreaks 2)  Lisinopril-hydrochlorothiazide 20-25 Mg Tabs (Lisinopril-hydrochlorothiazide) .... One tab by mouth daily 3)  Fluconazole 150 Mg Tabs (Fluconazole) .... One tab by mouth q3d x 2, then by mouth q weekly x 6 months. 4)  Bupropion Hcl 75 Mg Tabs (Bupropion hcl) .... One by mouth daily x 1 week then one by mouth two times a day x 1 week then one by mouth three times a day thereafter Prescriptions: BUPROPION HCL 75 MG TABS (BUPROPION HCL) one by mouth daily x 1 week then one by mouth two times a day x 1 week then one by mouth three times a day thereafter  #90 x 3   Entered and Authorized by:   Doralee Albino MD   Signed by:   Doralee Albino  MD on 11/22/2009   Method used:   Electronically to        [facility] [address] (retail)       [address]       [place], [place]  [address]       Ph: [phone]       Fax: [phone]   RxID:   [id number] LISINOPRIL-HYDROCHLOROTHIAZIDE 20-25 MG TABS (LISINOPRIL-HYDROCHLOROTHIAZIDE) one tab by mouth daily  #90 x 3   Entered and Authorized by:   Doralee Albino MD   Signed by:   Doralee Albino MD on 11/22/2009   Method used:   Electronically to        [facility] (retail)       [address]       [place], [place]  [address]       Ph: [phone]       Fax: [phone]   RxID:   [id number] VALTREX 500 MG TABS (VALACYCLOVIR HCL) 1 tablet by mouth daily for suppression and 1 tablet by mouth two times a day x 3 days for outbreaks  #90 x 3   Entered and Authorized by:   Doralee Albino MD   Signed by:   Doralee Albino MD on 11/22/2009   Method used:   Print then  Give to Patient   RxID:   [id number]    Orders Added: 1)  [facility]- Est Level  3 [[address]]

## 2010-02-04 NOTE — Assessment & Plan Note (Signed)
Summary: F/U UTI/[facility]   Vital Signs:  Patient profile:   35 year old female Weight:      263.4 pounds Temp:     98.4 degrees F oral Pulse rate:   73 / minute Pulse rhythm:   regular BP sitting:   127 / 82  (left arm) Cuff size:   large  Vitals Entered By: Loralee Pacas CMA (July 25, 2009 9:03 AM) CC: uti, yeast infection Comments pt stated that whenever she takes abx that she gets a severe yeast infection. when she saw Dr. Deirdre Peer before she would give her a longer course of diflucan so that she would not have to continually come here just to be checked for yeast infection esp when she has recurrent UTI's. She is very dissatified with the way that she is being treated by the physcians and staff here.   Primary Care Provider:  Renold Don MD  CC:  uti and yeast infection.  History of Present Illness: VAGINAL DISCHARGE Onset:  Description: whitish, thick  Symptoms Odor: no Itching: yes Vaginal burning: yes Dysuria: no Bleeding: no Pelvic pain: no Back pain: no Fever: no Genital sores:no  Rash: no Dyspareunia:no  GI Symptoms: no  Red Flags: no Missed period: no Recent antibiotics:yes  Possible STD exposure: no IUD: no Diabetes:no   Pt here with recent UTI with some blood on UA, tx w/ keflex but got vag discharge and stopped abx.  here for fu of microscopic hematuria and assessment of vag DC.  Got diflucan x1 but didn't help.  Gets lots of yeast infections.   Current Medications (verified): 1)  Hydrochlorothiazide 25 Mg  Tabs (Hydrochlorothiazide) .... Take One Tablet Daily For Blood Pressure 2)  Claritin 10 Mg Tabs (Loratadine) .... Once Daily As Needed Allergy 3)  Norvasc 5 Mg Tabs (Amlodipine Besylate) .Marland Kitchen.. 1 Tablet By Mouth Daily 4)  Valtrex 500 Mg Tabs (Valacyclovir Hcl) .Marland Kitchen.. 1 Tablet By Mouth Daily For Suppression and 1 Tablet By Mouth Two Times A Day X 3 Days For Outbreaks 5)  Fluticasone Propionate 50 Mcg/act Susp (Fluticasone Propionate) .... 2 Sprays Each  Nostril Daily - Disp 1 Device 6)  Lisinopril-Hydrochlorothiazide 20-25 Mg Tabs (Lisinopril-Hydrochlorothiazide) .... One Tab By Mouth Daily 7)  Keflex 500 Mg Caps (Cephalexin) .... One Tab Po Two Times A Day X 7d 8)  Phenazopyridine Hcl 200 Mg Tabs (Phenazopyridine Hcl) .... One Tab By Mouth Three Times A Day X 2 Days 9)  Fluconazole 150 Mg Tabs (Fluconazole) .... One Tab By Mouth X1  Allergies (verified): No Known Drug Allergies  Review of Systems       See HPI  Physical Exam  General:  Well-developed,well-nourished,in no acute distress; alert,appropriate and cooperative throughout examination Abdomen:  Bowel sounds positive,abdomen soft and non-tender without masses, organomegaly or hernias noted. Genitalia:  Normal introitus for age, no external lesions, minimal whitish vaginal discharge, mucosa pink and moist, no vaginal or cervical lesions, no vaginal atrophy, no friaility or hemorrhage, normal uterus size and position, no adnexal masses or tenderness.   Impression & Recommendations:  Problem # 1:  DYSURIA (ICD-788.1) Assessment Improved Didn't finish course abx, will have her finish.  Sx improved.  Her updated medication list for this problem includes:    Keflex 500 Mg Caps (Cephalexin) ..... One tab po two times a day x 7d    Phenazopyridine Hcl 200 Mg Tabs (Phenazopyridine hcl) ..... One tab by mouth three times a day x 2 days    Metronidazole 500 Mg Tabs (  Metronidazole) ..... One tab by mouth two times a day x 7d  Orders: Urinalysis-[facility] (00000) [facility]- Est  Level 4 (37902)  Problem # 2:  VAGINAL DISCHARGE (ICD-623.5) Assessment: New BV on WP, yeast clinically.  Recent abx and hx recurrent yeast infections, will go ahead and tx w/ chronic suppressive diflucan and flagyl for BV.  Handouts given, pt educated.  Orders: Wet Prep- [facility] (87210) [facility]- Est  Level 4 (40973)  Problem # 3:  MICROSCOPIC HEMATURIA (ICD-599.72) Assessment: New Persistent microscop hematuria.  Will  go ahead and US renal to assess for structural/neoplastic process.  Her updated medication list for this problem includes:    Keflex 500 Mg Caps (Cephalexin) ..... One tab po two times a day x 7d    Metronidazole 500 Mg Tabs (Metronidazole) ..... One tab by mouth two times a day x 7d  Orders: [facility]- Est  Level 4 (53299) Ultrasound (Ultrasound)  Complete Medication List: 1)  Hydrochlorothiazide 25 Mg Tabs (Hydrochlorothiazide) .... Take one tablet daily for blood pressure 2)  Claritin 10 Mg Tabs (Loratadine) .... Once daily as needed allergy 3)  Norvasc 5 Mg Tabs (Amlodipine besylate) .Marland Kitchen.. 1 tablet by mouth daily 4)  Valtrex 500 Mg Tabs (Valacyclovir hcl) .Marland Kitchen.. 1 tablet by mouth daily for suppression and 1 tablet by mouth two times a day x 3 days for outbreaks 5)  Fluticasone Propionate 50 Mcg/act Susp (Fluticasone propionate) .... 2 sprays each nostril daily - disp 1 device 6)  Lisinopril-hydrochlorothiazide 20-25 Mg Tabs (Lisinopril-hydrochlorothiazide) .... One tab by mouth daily 7)  Keflex 500 Mg Caps (Cephalexin) .... One tab po two times a day x 7d 8)  Phenazopyridine Hcl 200 Mg Tabs (Phenazopyridine hcl) .... One tab by mouth three times a day x 2 days 9)  Fluconazole 150 Mg Tabs (Fluconazole) .... One tab by mouth q3d x 2, then by mouth q weekly x 6 months. 10)  Metronidazole 500 Mg Tabs (Metronidazole) .... One tab by mouth two times a day x 7d  Patient Instructions: 1)  Treatment for recurrent yeast. 2)  Treatment for BV. 3)  Come back to see me if not better after your treatments.  4)  -Dr. Karie Schwalbe. Prescriptions: METRONIDAZOLE 500 MG TABS (METRONIDAZOLE) One tab by mouth two times a day x 7d  #14 x 0   Entered and Authorized by:   Rodney Langton MD   Signed by:   Rodney Langton MD on 07/25/2009   Method used:   Electronically to        [facility] [phone]* (retail)       [address]       [place], [place]  [address]       Ph: [phone]       Fax:  [phone]   RxID:   [id number] FLUCONAZOLE 150 MG TABS (FLUCONAZOLE) One tab by mouth q3d x 2, then by mouth q weekly x 6 months.  #26 x 0   Entered and Authorized by:   Rodney Langton MD   Signed by:   Rodney Langton MD on 07/25/2009   Method used:   Electronically to        [facility] : [facility] [phone]* (retail)       [address]       [place], [place]  [address]       Ph: [phone]       Fax: [phone]   RxID:   [id number]   Laboratory Results   Urine Tests  Date/Time Received:  July 25, 2009 9:02 AM  Date/Time Reported: July 25, 2009 10:11 AM   Routine Urinalysis   Color: yellow Appearance: Clear Glucose: negative   (Normal Range: Negative) Bilirubin: small;  reflex ictotest = negative   (Normal Range: Negative) Ketone: negative   (Normal Range: Negative) Spec. Gravity: 1.025   (Normal Range: 1.003-1.035) Blood: moderate   (Normal Range: Negative) pH: 6.0   (Normal Range: 5.0-8.0) Protein: 30   (Normal Range: Negative) Urobilinogen: 1.0   (Normal Range: 0-1) Nitrite: negative   (Normal Range: Negative) Leukocyte Esterace: negative   (Normal Range: Negative)  Urine Microscopic WBC/HPF: 0-3 RBC/HPF: 5-10 Bacteria/HPF: 1+ Mucous/HPF: 2+ Epithelial/HPF: 5-10 Other: few sperm    Comments: ...............test performed by......Marland KitchenBonnie A. [place], MLS (ASCP)cm  Date/Time Received: July 25, 2009 9:34 AM  Date/Time Reported: July 25, 2009 10:13 AM   Wet [place] Source: vag WBC/hpf: >20 Bacteria/hpf: 2+ rods and cocci Clue cells/hpf: few  Negative whiff Yeast/hpf: none Trichomonas/hpf: none Comments: several sperm ...............test performed by......Marland KitchenBonnie A. [place], MLS (ASCP)cm

## 2010-02-04 NOTE — Progress Notes (Signed)
Summary: Test Res  Phone Note Call from Patient Call back at Home Phone [phone]   Caller: Patient Summary of Call: Checking on results of ultrasound she had about a week ago. Initial call taken by: Clydell Hakim,  August 05, 2009 3:40 PM  Follow-up for Phone Call        told her the radiology report showed no problems. she wants to know what next. sh is concerned & wants to know why she has blood in her urine. told her I will send to Dr. Karie Schwalbe & will call her with repsonse Follow-up by: Golden Circle RN,  August 05, 2009 4:03 PM  Additional Follow-up for Phone Call Additional follow up Details #1::        Was negative, she can come back for a repeat UA to ensure hematuria cleared.  Should be an office visit with me so that we can do urology referral if needed (if UA still with blood) Additional Follow-up by: Rodney Langton MD,  August 05, 2009 4:15 PM    Additional Follow-up for Phone Call Additional follow up Details #2::    LM for her to call me back Follow-up by: Golden Circle RN,  August 05, 2009 4:46 PM  Additional Follow-up for Phone Call Additional follow up Details #3:: Details for Additional Follow-up Action Taken: she will see Dr. Claudius Sis tomorrow at 3. encouraged her to drink plenty of water during the hot weather Additional Follow-up by: Golden Circle RN,  August 06, 2009 8:54 AM

## 2010-02-04 NOTE — Letter (Signed)
Summary: NECK PAIN/KH  Patient left before being seen. She stated that no one told her that she would have to wait to be seen. I explained to her that we would be happy to see her if she is willing to wait longer, but that we have to see our scheduled patients first. We would try to work her in if there was a no show, but i couldn't guarantee what time it would be. She said she would just go to [facility] instead of having to wait. She said they would be able to see her right away.Jimmy Footman, CMA  October 31, 2009 3:58 PM     Vital Signs:  Patient profile:   35 year old female Height:      68.5 inches Weight:      270 pounds BMI:     40.60 Temp:     98.9 degrees F Pulse rate:   82 / minute BP sitting:   131 / 73  (left arm) Cuff size:   large  Vitals Entered By: Jimmy Footman, CMA (October 31, 2009 3:16 PM) CC: neck pain started this afternoon Is Patient Diabetic? No Pain Assessment Patient in pain? yes     Location: neck Intensity: 10 Type: sharp   CC:  neck pain started this afternoon.  Habits & Providers  Alcohol-Tobacco-Diet     Tobacco Status: quit > 6 months     Tobacco Counseling: to remain off tobacco products     Cigarette Packs/Day: social smoker     Year Started: PK/WK     Year Quit: 2010     Passive Smoke Exposure: no  Exercise-Depression-Behavior     Have you felt down or hopeless? no     Have you felt little pleasure in things? no     Depression Counseling: not indicated; screening negative for depression  Allergies: No Known Drug Allergies   Complete Medication List: 1)  Valtrex 500 Mg Tabs (Valacyclovir hcl) .Marland Kitchen.. 1 tablet by mouth daily for suppression and 1 tablet by mouth two times a day x 3 days for outbreaks 2)  Lisinopril-hydrochlorothiazide 20-25 Mg Tabs (Lisinopril-hydrochlorothiazide) .... One tab by mouth daily 3)  Fluconazole 150 Mg Tabs (Fluconazole) .... One tab by mouth q3d x 2, then by mouth q weekly x 6 months.  Other Orders: No Charge  Patient Arrived (NCPA0) (NCPA0)   Orders Added: 1)  No Charge Patient Arrived (NCPA0) [NCPA0]

## 2010-02-04 NOTE — Assessment & Plan Note (Signed)
Summary: yeast inf?,df   Vital Signs:  Patient profile:   35 year old female Height:      68.5 inches Weight:      259 pounds BMI:     38.95 BSA:     2.30 Temp:     98.7 degrees F Pulse rate:   92 / minute BP sitting:   121 / 79  Vitals Entered By: Jone Baseman CMA (March 27, 2009 8:50 AM) CC: Yeast infection/ strep throat?, Vaginal discharge, URI symptoms Is Patient Diabetic? No Pain Assessment Patient in pain? no        Primary Care Provider:  Renold Don MD  CC:  Yeast infection/ strep throat?, Vaginal discharge, and URI symptoms.  History of Present Illness:  Vaginal discharge      This is a 35 year old woman who presents with Vaginal discharge.  The patient complains of itching and vaginal burning, but denies fever and pelvic pain.  The discharge is described as white.  Risk factors for vaginal discharge include recent douching.  The patient denies the following symptoms: genital sores, rash, and headache.  Prior treatment has included OTC antifungals.         The patient also presents with URI symptoms.  The patient complains of sore throat and sick contacts, but denies nasal congestion, dry cough, and earache.  The patient denies fever.  The patient also reports muscle aches.  The patient denies itchy watery eyes, itchy throat, sneezing, seasonal symptoms, and headache.    Habits & Providers  Alcohol-Tobacco-Diet     Tobacco Status: never  Current Problems (verified): 1)  Vaginal Discharge  (ICD-623.5) 2)  Sore Throat  (ICD-462) 3)  Screening Examination For Pulmonary Tuberculosis  (ICD-V74.1) 4)  Need Prophylactic Vaccination&inoculation Flu  (ICD-V04.81) 5)  Proteinuria, Mild  (ICD-791.0) 6)  Hx of Ovarian Cyst, Right  (ICD-620.2) 7)  Hx of Ovarian Cyst, Left  (ICD-620.2) 8)  Abdominal Pain Right Lower Quadrant  (ICD-789.03) 9)  Hypertension, Benign Systemic  (ICD-401.1) 10)  Obesity, Nos  (ICD-278.00) 11)  Anemia, Other, Unspecified  (ICD-285.9) 12)   Anxiety State, Unspecified  (ICD-300.00) 13)  Pyogenic Granuloma, Oral - S/p Bx  (ICD-686.1) 14)  Onychomycosis, Toenails  (ICD-110.1) 15)  Genital Herpes  (ICD-054.10) 16)  Rhinitis, Allergic  (ICD-477.9) 17)  Heel Spur  (ICD-726.73) 18)  Hidradenitis Suppurativa  (ICD-705.83) 19)  Tobacco Use, Quit  (ICD-V15.82)  Current Medications (verified): 1)  Hydrochlorothiazide 25 Mg  Tabs (Hydrochlorothiazide) .... Take One Tablet Daily For Blood Pressure 2)  Claritin 10 Mg Tabs (Loratadine) .... Once Daily As Needed Allergy 3)  Norvasc 5 Mg Tabs (Amlodipine Besylate) .Marland Kitchen.. 1 Tablet By Mouth Daily 4)  Valtrex 500 Mg Tabs (Valacyclovir Hcl) .Marland Kitchen.. 1 Tablet By Mouth Daily For Suppression and 1 Tablet By Mouth Two Times A Day X 3 Days For Outbreaks 5)  Fluticasone Propionate 50 Mcg/act Susp (Fluticasone Propionate) .... 2 Sprays Each Nostril Daily - Disp 1 Device 6)  Metronidazole 500 Mg Tabs (Metronidazole) .Marland Kitchen.. 1 Tab By Mouth Two Times A Day For 7 Days  Allergies (verified): No Known Drug Allergies  Past History:  Past Medical History: Last updated: 01/16/2008 h/o abnl pap h/o genital herpes - on suppression for recurrent outbreaks Frequent, bad candidal vaginitis - multiday Rx with Diflucan Hypertension Previously prescribed Fentermine by Dr. 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  The patient denies anorexia, fever, chest pain, syncope, dyspnea on exertion, peripheral edema, prolonged cough, headaches, and abdominal pain.    Physical Exam  General:  alert and overweight-appearing.   Head:  normocephalic and atraumatic.   Mouth:  good dentition and pharynx pink and moist.   Neck:  supple.   Lungs:  normal respiratory effort and normal breath sounds.   Heart:  normal rate and regular rhythm.   Abdomen:  soft, non-tender, and normal bowel sounds.   Genitalia:  normal introitus, no external lesions, normal uterus size and position, no adnexal masses or tenderness, labial erythema, and vaginal discharge.     Impression & Recommendations:  Problem # 1:  SORE THROAT (ICD-462)  Her updated medication list for this problem includes:    Metronidazole 500 Mg Tabs (Metronidazole) .Marland Kitchen... 1 tab by mouth two times a day for 7 days  Orders: Rapid Strep-[facility] (82956) [facility]- Est Level  3 (21308)  Problem # 2:  VAGINAL DISCHARGE (ICD-623.5) Will treat presumptively for yeast.  Stop douching. Orders: GC/Chlamydia-[facility] (87591/87491) Wet Prep- [facility] (65784) [facility]- Est Level  3 (69629)  Complete Medication List: 1)  Hydrochlorothiazide 25 Mg Tabs (Hydrochlorothiazide) .... Take one tablet daily for blood pressure 2)  Claritin 10 Mg Tabs (Loratadine) .... Once daily as needed allergy 3)  Norvasc 5 Mg Tabs (Amlodipine besylate) .Marland Kitchen.. 1 tablet by mouth daily 4)  Valtrex 500 Mg Tabs (Valacyclovir hcl) .Marland Kitchen.. 1 tablet by mouth daily for suppression and 1 tablet by mouth two times a day x 3 days for outbreaks 5)  Fluticasone Propionate 50 Mcg/act Susp (Fluticasone propionate) .... 2 sprays each nostril daily - disp 1 device 6)  Metronidazole 500 Mg Tabs (Metronidazole) .Marland Kitchen.. 1 tab by mouth two times a day for 7 days 7)  Diflucan 150 Mg Tabs (Fluconazole) .Marland Kitchen.. 1 by mouth  x 1, repeat in 24 hours  Patient Instructions: 1)  Please schedule a follow-up appointment as needed .  Prescriptions: DIFLUCAN 150 MG TABS (FLUCONAZOLE) 1 by mouth x 1, repeat in 24 hours  #2 x 2   Entered and Authorized by:   Tinnie Gens MD   Signed by:   Tinnie Gens MD on 03/27/2009   Method used:   Faxed to ...       [facility] (retail)       [address]       [place], [place]  [address]       Ph: [phone]       Fax: [phone]   RxID:   [id number]   Laboratory Results  Date/Time Received: March 27, 2009 8:55 AM  Date/Time Reported: March 27, 2009 9:10 AM   [employer] Source: VAGINAL WBC/hpf: 0-3 Bacteria/hpf: 3+  Rods Clue cells/hpf: none  Negative whiff Yeast/hpf: few Trichomonas/hpf: none  Other Tests  Rapid Strep: negative  Comments: ........Marland Kitchenwet prep performed by...........Marland KitchenTerese Door, CMA  ...............test performed by......Marland KitchenBonnie A. [place], MLS (ASCP)cm

## 2010-02-04 NOTE — Assessment & Plan Note (Signed)
Summary: sweeling in feet,df   Vital Signs:  Patient profile:   35 year old female Height:      68.5 inches Weight:      268 pounds BMI:     40.30 BSA:     2.33 Temp:     98.5 degrees F Pulse rate:   78 / minute BP sitting:   132 / 84  Vitals Entered By: Jone Baseman CMA (July 02, 2009 10:35 AM) CC: feet swelling x 4 days Is Patient Diabetic? No Pain Assessment Patient in pain? no        Primary Care Provider:  Renold Don MD  CC:  feet swelling x 4 days.  History of Present Illness: Patient here for c/o BLE edema x 4 days, which resolved yesterday.  Patient states she treated the edema by increasing her HCTZ herself.  She has been on Norvasc and HCTZ for HTN x 6 years.  She denies any increase in salt intake prior to increased edema, and does not remember doing anything differently such as prolonged standing/walking.  She denies cough or SOB.  Habits & Providers  Alcohol-Tobacco-Diet     Tobacco Status: never  Allergies: No Known Drug Allergies  Physical Exam  General:  Well-developed,well-nourished,in no acute distress; alert,appropriate and cooperative throughout examination, obese Neck:  No deformities, masses, or tenderness noted. Lungs:  Normal respiratory effort, chest expands symmetrically. Lungs are clear to auscultation, no crackles or wheezes. Heart:  Normal rate and regular rhythm. S1 and S2 normal without gallop, murmur, click, rub or other extra sounds. Extremities:  No clubbing, cyanosis, edema, or deformity noted with normal full range of motion of all joints.     Impression & Recommendations:  Problem # 1:  EDEMA (ICD-782.3) Unclear etiology.  Will check CMP, CBC. Will d/c Norvasc as this is a potential side effect. Will switch to Lisinopril.  Will have her follow up in 2 weeks with PCP to discuss efficacy of med and any side effects.  Her updated medication list for this problem includes:    Lisinopril-hydrochlorothiazide 20-25 Mg Tabs  (Lisinopril-hydrochlorothiazide) ..... One tab by mouth daily  Orders: Comp Met-[facility] ([id number]) CBC-[facility] (57846) [facility]- Est Level  3 (96295)  Problem # 2:  HYPERTENSION, BENIGN SYSTEMIC (ICD-401.1)  D/C Norvasc. Start Lisinopril/HCTZ 20/25 combo.  Her updated medication list for this problem includes:    Lisinopril-hydrochlorothiazide 20-25 Mg Tabs (Lisinopril-hydrochlorothiazide) ..... One tab by mouth daily  Orders: [facility]- Est Level  3 (28413)  Complete Medication List: 1)  Hydrochlorothiazide 25 Mg Tabs (Hydrochlorothiazide) .... Take one tablet daily for blood pressure 2)  Claritin 10 Mg Tabs (Loratadine) .... Once daily as needed allergy 3)  Norvasc 5 Mg Tabs (Amlodipine besylate) .Marland Kitchen.. 1 tablet by mouth daily 4)  Valtrex 500 Mg Tabs (Valacyclovir hcl) .Marland Kitchen.. 1 tablet by mouth daily for suppression and 1 tablet by mouth two times a day x 3 days for outbreaks 5)  Fluticasone Propionate 50 Mcg/act Susp (Fluticasone propionate) .... 2 sprays each nostril daily - disp 1 device 6)  Metronidazole 500 Mg Tabs (Metronidazole) .Marland Kitchen.. 1 tab by mouth two times a day for 7 days 7)  Diflucan 150 Mg Tabs (Fluconazole) .Marland Kitchen.. 1 by mouth x 1, repeat in 24 hours 8)  Lisinopril-hydrochlorothiazide 20-25 Mg Tabs (Lisinopril-hydrochlorothiazide) .... One tab by mouth daily  Patient Instructions: 1)  Stop taking Norvasc 2)  Make a follow up appointment with Dr. Gwendolyn Grant in 2 weeks for a blood pressure check and to make sure you  aren't having any side effects from the new medication.   Prescriptions: LISINOPRIL-HYDROCHLOROTHIAZIDE 20-25 MG TABS (LISINOPRIL-HYDROCHLOROTHIAZIDE) one tab by mouth daily  #90 x 1   Entered and Authorized by:   Jettie Pagan MD   Signed by:   Jettie Pagan MD on 07/02/2009   Method used:   Electronically to        [facility] [phone]* (retail)       [address]       [place], [place]  [address]       Ph: [phone]       Fax: [phone]   RxID:    [id number]

## 2010-02-04 NOTE — Assessment & Plan Note (Signed)
Summary: ?UTI [facility]   Vital Signs:  Patient profile:   35 year old female Weight:      265.6 pounds Temp:     98 degrees F oral Pulse rate:   83 / minute Pulse rhythm:   regular BP sitting:   128 / 81  (left arm) Cuff size:   large  Vitals Entered By: Loralee Pacas CMA (July 16, 2009 9:50 AM) CC: uti   Primary Care Provider:  Renold Don MD  CC:  uti.  History of Present Illness: DYSURIA Onset:  5d Description: painful urination, frequency.  Symptoms Urgency:  YES Frequency:  YES Hesitancy:  no Hematuria:  no Flank Pain:  no Fever: no Nausea/Vomiting:  no Missed LMP: no STD exposure: no Discharge: no Irritants: no Rash: no  Red Flags   More than 3 UTI's last 12 months:  no PMH of  Diabetes or Immunosuppression:  no Renal Disease/Calculi: no Urinary Tract Abnormality: no  Instrumentation or Trauma: no    Current Medications (verified): 1)  Hydrochlorothiazide 25 Mg  Tabs (Hydrochlorothiazide) .... Take One Tablet Daily For Blood Pressure 2)  Claritin 10 Mg Tabs (Loratadine) .... Once Daily As Needed Allergy 3)  Norvasc 5 Mg Tabs (Amlodipine Besylate) .Marland Kitchen.. 1 Tablet By Mouth Daily 4)  Valtrex 500 Mg Tabs (Valacyclovir Hcl) .Marland Kitchen.. 1 Tablet By Mouth Daily For Suppression and 1 Tablet By Mouth Two Times A Day X 3 Days For Outbreaks 5)  Fluticasone Propionate 50 Mcg/act Susp (Fluticasone Propionate) .... 2 Sprays Each Nostril Daily - Disp 1 Device 6)  Lisinopril-Hydrochlorothiazide 20-25 Mg Tabs (Lisinopril-Hydrochlorothiazide) .... One Tab By Mouth Daily 7)  Keflex 500 Mg Caps (Cephalexin) .... One Tab Po Two Times A Day X 7d 8)  Phenazopyridine Hcl 200 Mg Tabs (Phenazopyridine Hcl) .... One Tab By Mouth Three Times A Day X 2 Days  Allergies (verified): No Known Drug Allergies  Review of Systems       See HPI  Physical Exam  General:  Well-developed,well-nourished,in no acute distress; alert,appropriate and cooperative throughout examination Abdomen:  Bowel  sounds positive,abdomen soft and without masses, organomegaly or hernias noted.  moderate TTP suprapubic.  No CVAT.     Impression & Recommendations:  Problem # 1:  DYSURIA (ICD-788.1) Assessment New Likely Cystitis with leuk esterase.  Will Cx urine and tx with keflex.  Azo standard for painful symptoms. Pt did have some blood so want to see her again in a week to ensure resolution of hematuria after UTI treated.  The following medications were removed from the medication list:    Metronidazole 500 Mg Tabs (Metronidazole) .Marland Kitchen... 1 tab by mouth two times a day for 7 days Her updated medication list for this problem includes:    Keflex 500 Mg Caps (Cephalexin) ..... One tab po two times a day x 7d    Phenazopyridine Hcl 200 Mg Tabs (Phenazopyridine hcl) ..... One tab by mouth three times a day x 2 days  Orders: Urinalysis-[facility] (00000) Urine Culture-[facility] ([address]) [facility]- Est Level  3 (09811)  Complete Medication List: 1)  Hydrochlorothiazide 25 Mg Tabs (Hydrochlorothiazide) .... Take one tablet daily for blood pressure 2)  Claritin 10 Mg Tabs (Loratadine) .... Once daily as needed allergy 3)  Norvasc 5 Mg Tabs (Amlodipine besylate) .Marland Kitchen.. 1 tablet by mouth daily 4)  Valtrex 500 Mg Tabs (Valacyclovir hcl) .Marland Kitchen.. 1 tablet by mouth daily for suppression and 1 tablet by mouth two times a day x 3 days for outbreaks 5)  Fluticasone  Propionate 50 Mcg/act Susp (Fluticasone propionate) .... 2 sprays each nostril daily - disp 1 device 6)  Lisinopril-hydrochlorothiazide 20-25 Mg Tabs (Lisinopril-hydrochlorothiazide) .... One tab by mouth daily 7)  Keflex 500 Mg Caps (Cephalexin) .... One tab po two times a day x 7d 8)  Phenazopyridine Hcl 200 Mg Tabs (Phenazopyridine hcl) .... One tab by mouth three times a day x 2 days  Patient Instructions: 1)  Great to meet you today, 2)  It looks like you do have cystitis. 3)  There was some blood in your urine so I want you to come back to see me in a week after  we treat your UTI to recheck to make sure the blood is gone. 4)  See below for antibiotics and urinary analgesic. 5)  -Dr. Karie Schwalbe. Prescriptions: PHENAZOPYRIDINE HCL 200 MG TABS (PHENAZOPYRIDINE HCL) One tab by mouth three times a day x 2 days  #6 x 0   Entered and Authorized by:   Rodney Langton MD   Signed by:   Rodney Langton MD on 07/16/2009   Method used:   Print then Give to Patient   RxID:   [id number] KEFLEX 500 MG CAPS (CEPHALEXIN) One tab Po two times a day x 7d  #14 x 0   Entered and Authorized by:   Rodney Langton MD   Signed by:   Rodney Langton MD on 07/16/2009   Method used:   Print then Give to Patient   RxID:   [id number]   Laboratory Results   Urine Tests  Date/Time Received: July 16, 2009 9:41 AM  Date/Time Reported: July 16, 2009 10:36 AM   Routine Urinalysis   Color: yellow Appearance: Clear Glucose: negative   (Normal Range: Negative) Bilirubin: negative   (Normal Range: Negative) Ketone: negative   (Normal Range: Negative) Spec. Gravity: >=1.030   (Normal Range: 1.003-1.035) Blood: moderate   (Normal Range: Negative) pH: 6.0   (Normal Range: 5.0-8.0) Protein: 30   (Normal Range: Negative) Urobilinogen: 0.2   (Normal Range: 0-1) Nitrite: negative   (Normal Range: Negative) Leukocyte Esterace: trace   (Normal Range: Negative)  Urine Microscopic WBC/HPF: 10-15 RBC/HPF: 1-5 Bacteria/HPF: 2+ Epithelial/HPF: 5-10 Yeast/HPF: few Other: 5-10 macrophages    Comments: urine sent for culture ...........test performed by...........Marland KitchenTerese Door, CMA

## 2010-02-14 ENCOUNTER — Encounter: Payer: Self-pay | Admitting: *Deleted

## 2010-03-18 LAB — RAPID STREP SCREEN (MED CTR MEBANE ONLY): Streptococcus, Group A Screen (Direct): NEGATIVE

## 2010-04-16 ENCOUNTER — Ambulatory Visit (INDEPENDENT_AMBULATORY_CARE_PROVIDER_SITE_OTHER): Payer: 59 | Admitting: Family Medicine

## 2010-04-16 ENCOUNTER — Ambulatory Visit
Admission: RE | Admit: 2010-04-16 | Discharge: 2010-04-16 | Disposition: A | Discharge status: Home / Self Care | Payer: 59 | Source: Ambulatory Visit | Attending: Family Medicine | Admitting: Family Medicine

## 2010-04-16 ENCOUNTER — Other Ambulatory Visit (HOSPITAL_COMMUNITY)
Admission: RE | Admit: 2010-04-16 | Discharge: 2010-04-16 | Disposition: A | Discharge status: Home / Self Care | Payer: 59 | Source: Ambulatory Visit | Attending: Family Medicine | Admitting: Family Medicine

## 2010-04-16 DIAGNOSIS — B373 Candidiasis of vulva and vagina: Secondary | ICD-10-CM

## 2010-04-16 DIAGNOSIS — R739 Hyperglycemia, unspecified: Secondary | ICD-10-CM

## 2010-04-16 DIAGNOSIS — F3289 Other specified depressive episodes: Secondary | ICD-10-CM

## 2010-04-16 DIAGNOSIS — Z01419 Encounter for gynecological examination (general) (routine) without abnormal findings: Secondary | ICD-10-CM | POA: Insufficient documentation

## 2010-04-16 DIAGNOSIS — R7309 Other abnormal glucose: Secondary | ICD-10-CM

## 2010-04-16 DIAGNOSIS — Z124 Encounter for screening for malignant neoplasm of cervix: Secondary | ICD-10-CM

## 2010-04-16 DIAGNOSIS — N760 Acute vaginitis: Secondary | ICD-10-CM

## 2010-04-16 DIAGNOSIS — M79609 Pain in unspecified limb: Secondary | ICD-10-CM

## 2010-04-16 DIAGNOSIS — M79646 Pain in unspecified finger(s): Secondary | ICD-10-CM

## 2010-04-16 DIAGNOSIS — F329 Major depressive disorder, single episode, unspecified: Secondary | ICD-10-CM

## 2010-04-16 DIAGNOSIS — I10 Essential (primary) hypertension: Secondary | ICD-10-CM

## 2010-04-16 DIAGNOSIS — B3731 Acute candidiasis of vulva and vagina: Secondary | ICD-10-CM

## 2010-04-16 DIAGNOSIS — A6000 Herpesviral infection of urogenital system, unspecified: Secondary | ICD-10-CM

## 2010-04-16 LAB — POCT GLYCOSYLATED HEMOGLOBIN (HGB A1C): Hemoglobin A1C: 5.4

## 2010-04-16 LAB — POCT WET PREP (WET MOUNT): Trichomonas Wet Prep HPF POC: NEGATIVE

## 2010-04-16 NOTE — Patient Instructions (Signed)
We are going to send you for x-rays.  They will let me know the results.  If there is a broken bone in the joint, you may need to see a Hand Specialist. I'll call you with the results of the wet prep.   We will also check you for diabetes today. It was good to see you!

## 2010-04-16 NOTE — Progress Notes (Signed)
  Subjective:    Patient ID: Kelsey Zavala, female    DOB: 1975/04/30, 35 y.o.   MRN: [id number]  HPI 1.  Vaginal discharge:  History of genital herpes, recurrent yeast infections.  Has been on prophylactic Diflucan in past.  As soon as she stops medication yeast returns.  Currently describes itching, burning, curd-like discharge.  No dysuria, frequency, hesitancy.    2.  Finger pain/dislocation:  Fell while walking up steps carrying groceries 3 weeks ago.  Landed on hand, thinks she dislocated finger.  "Finger pointing left like an L" - sounds like it was dislocated.  Father pulled back into place.  Still with swelling, bruising, pain since then.  Buddy taped fingers together, tried OTC splint for several days but unable to wear protective gloves while at work and therefore removed splint.  No protection currently.  Some OTC meds with relief of pain.    3.  CPE:  See below for review of symptoms.  No complaints except as above.   Review of Systems The patient denies fever, unusual weight change, decreased hearing, chest pain, palpitations, pre-syncopal or syncopal episodes, dyspnea on exertion, prolonged cough, hemoptysis, change in bowel habits, melena, hematochezia, severe indigestion/heartburn, nausea/vomiting/abdominal pain, muscle weakness, difficulty walking, abnormal bleeding, or enlarged lymph nodes.      Objective:   Physical Exam Gen:  Alert, cooperative patient who appears stated age in no acute distress.  Vital signs reviewed. HEENT:  [place]/AT.  EOMI, PERRL.  MMM, tonsils non-erythematous, non-edematous.  External ears WNL, Bilateral TM's normal without retraction, redness or bulging.  Cardiac:  Regular rate and rhythm without murmur auscultated.  Good S1/S2. Pulm:  Clear to auscultation bilaterally with good air movement.  No wheezes or rales noted.   Abd:  Soft/nondistended/nontender.  Good bowel sounds throughout all four quadrants.  No masses noted.  GYN:  External genitalia within  normal limits, no herpetic lesions noted.  Vaginal mucosa pink, moist, normal rugae.  Nonfriable cervix without lesions, no r bleeding noted on speculum exam.  Thick white discharge present in vaginal vault, removed with wet prep cotton swabs.  Bimanual exam revealed normal, nongravid uterus.  No cervical motion tenderness. No adnexal masses bilaterally.  Pap obtained.   Ext:  No clubbing/cyanosis/erythema.  No edema noted bilateral lower extremities.  Middle finger of Left hand -- bruising and swelling of PIP joint.  Tender to palpation along joint, not above or below joint.  Cannot straighten finger totally, cannot bend past ~30 degrees.  No open wound/laceration, no red or hot. Neuro: Alert and oriented to person, place, and date.  CN II-XII intact.  No focal deficits noted.   Skin - no sores or suspicious lesions or rashes or color changes         Assessment & Plan:

## 2010-04-17 ENCOUNTER — Telehealth: Payer: Self-pay | Admitting: Family Medicine

## 2010-04-17 MED ORDER — FLUCONAZOLE 150 MG PO TABS: 150.0000 mg | ORAL_TABLET | Freq: Every day | ORAL | Status: DC

## 2010-04-17 NOTE — Telephone Encounter (Signed)
Pt is asking for results of test from yesterday

## 2010-04-17 NOTE — Telephone Encounter (Signed)
Called and discussed results with patient. Will continue immobilization for finger, RICE treatment as xray negative. Diflucan for yeast.   Will discuss with Dr. Jennette Kettle treatment for recurrent yeast.

## 2010-04-20 ENCOUNTER — Encounter: Payer: Self-pay | Admitting: Family Medicine

## 2010-04-20 DIAGNOSIS — B3731 Acute candidiasis of vulva and vagina: Secondary | ICD-10-CM | POA: Insufficient documentation

## 2010-04-20 DIAGNOSIS — B373 Candidiasis of vulva and vagina: Secondary | ICD-10-CM | POA: Insufficient documentation

## 2010-04-20 DIAGNOSIS — M79646 Pain in unspecified finger(s): Secondary | ICD-10-CM | POA: Insufficient documentation

## 2010-04-20 NOTE — Assessment & Plan Note (Signed)
Did not discuss.   I did not see this in problem list or I would have brought it up. Unsure if taking medications, I have not refilled Buproprion so doubtful. She did not mention this.  Will d/w patient at next visit.

## 2010-04-20 NOTE — Assessment & Plan Note (Signed)
BP Readings from Last 3 Encounters:  04/16/10 122/81  11/22/09 155/78  10/31/09 131/73    BP's mostly WNL, at goal below 140/90.  Will remove from problem list

## 2010-04-20 NOTE — Assessment & Plan Note (Signed)
Xray to assess for fracture as still swollen, discolored, painful.  To continue splinting or at least buddy taping fingers.  Does not want to splint b/c then cannot use gloves at work.   FU in 1 month to assess for improvement.  RICE therapy, OTC pain meds. Not septic. Precepted with Sports Med Fellow who agreed with plan.

## 2010-04-20 NOTE — Assessment & Plan Note (Signed)
Currently without lesions or symptoms.   Will follow.

## 2010-05-23 NOTE — Discharge Summary (Signed)
NAMELEIANI, ENRIGHT                            ACCOUNT NO.:  [id number]   MEDICAL RECORD NO.:  [id number]                   PATIENT TYPE:  INP   LOCATION:  9154                                 FACILITY:  [facility]   PHYSICIAN:  Phil D. Okey Dupre, M.D.                  DATE OF BIRTH:  1975-01-25   DATE OF ADMISSION:  12/30/2002  DATE OF DISCHARGE:  01/03/2003                                 DISCHARGE SUMMARY   HISTORY OF PRESENT ILLNESS:  The patient is a 35 year old, gravida 4, para 2-  0-1-2, at 30 weeks and 5 days gestation, who was admitted with suprapubic  pain, and a urine that showed a few white cells and Trichomonas.  The  patient had been treated and had come in with a complaint of not having  eaten in several days, and had nausea and vomiting.  She had had several  bouts of Trichomonas, and was placed on metronidazole, of which she took one  dose, and began having a lot of vomiting, which we thought was due to the  medication.   The resident seeing the patient had the impression that the patient had  pyelonephritis, and we admitted the patient for treatment.  Her suprapubic  symptoms went away within the day after admission, after she was treated  with cefotetan, and started the day before discharge on IV Flagyl.  The  urine culture came back showing no significant growth.  All other lab tests  were normal.   DISCHARGE MEDICATIONS:  The patient was discharged on oral metronidazole 250  t.i.d. x7 days, to be followed in the high risk OB clinic in two weeks.   DISCHARGE DIAGNOSES:  Viral syndrome, nausea, and vomiting.                                               Phil D. Okey Dupre, M.D.    PDR/MEDQ  D:  01/03/2003  T:  01/03/2003  Job:  [id number]

## 2010-05-23 NOTE — Op Note (Signed)
NAMEMARICE, GUIDONE                            ACCOUNT NO.:  [id number]   MEDICAL RECORD NO.:  [id number]                   PATIENT TYPE:  INP   LOCATION:  9123                                 FACILITY:  [facility]   PHYSICIAN:  Phil D. Okey Dupre, M.D.                  DATE OF BIRTH:  [birth year]   DATE OF PROCEDURE:  03/02/2003  DATE OF DISCHARGE:                                 OPERATIVE REPORT   PROCEDURE:  Bilateral tubal ligation, partial salpingectomy, modified  [place].   PREOPERATIVE DIAGNOSIS:  Voluntary sterilization.   POSTOPERATIVE DIAGNOSIS:  Voluntary sterilization.   ANESTHESIA:  Epidural.   SURGEON:  Phil D. Rose, M.D.   ESTIMATED BLOOD LOSS:  Less than 5 cc.   POSTOPERATIVE CONDITION:  Satisfactory.   PATHOLOGY:  Specimens sent:  Portions of salpingectomy.   DESCRIPTION OF PROCEDURE:  Under satisfactory epidural anesthesia, with the  patient in the dorsal supine position, the abdomen was prepped and draped in  the usual sterile manner, entered through a transverse subumbilical incision  situated 1.5 cm below the umbilical and extending for a total length of 5  cm.  The abdomen was entered by layers.  On entering the peritoneal cavity,  each fallopian tube was identified. followed out to the fimbriated end, and  grasped at the mid-point with a Babcock clamp.  An opening was made in an  avascular portion of the area beneath the tube and with a hemostat through  that with a #1 plain suture drawn and tied around the distal and proximal  ends of the tube to form a loop of approximately 2 cm in length above the  tie.  A second tie was placed just below the aforementioned tie, and the  section of the tube above the ties excised, and the exposed ends of the tube  coagulated with the hot cautery.  The areas were observed for bleeding.  None was noted.  The peritoneum and fascia were then closed with a  continuous running 0 Vicryl suture on an atraumatic needle which was  continued up to a subcutaneous subcuticular closure.  Dry sterile dressing  was applied.  Tape, instrument, sponge, and needle counts were reported  correct at the end of the procedure.  The patient was transferred to the  recovery room in satisfactory condition.                                               Phil D. Okey Dupre, M.D.    PDR/MEDQ  D:  03/04/2003  T:  03/04/2003  Job:  [id number]

## 2010-06-03 ENCOUNTER — Encounter: Payer: Self-pay | Admitting: Family Medicine

## 2010-06-03 NOTE — Telephone Encounter (Signed)
This encounter was created in error - please disregard.

## 2010-11-05 ENCOUNTER — Observation Stay (HOSPITAL_COMMUNITY)
Admission: EM | Admit: 2010-11-05 | Discharge: 2010-11-06 | Disposition: A | Discharge status: Home / Self Care | Payer: 59 | Attending: Family Medicine | Admitting: Family Medicine

## 2010-11-05 ENCOUNTER — Emergency Department (HOSPITAL_COMMUNITY): Payer: 59

## 2010-11-05 ENCOUNTER — Encounter: Payer: Self-pay | Admitting: Family Medicine

## 2010-11-05 DIAGNOSIS — I10 Essential (primary) hypertension: Secondary | ICD-10-CM | POA: Insufficient documentation

## 2010-11-05 DIAGNOSIS — N179 Acute kidney failure, unspecified: Secondary | ICD-10-CM

## 2010-11-05 DIAGNOSIS — R0789 Other chest pain: Secondary | ICD-10-CM

## 2010-11-05 LAB — POCT I-STAT, CHEM 8
BUN: 20 mg/dL (ref 6–23)
Calcium, Ion: 1.18 mmol/L (ref 1.12–1.32)
Chloride: 102 mEq/L (ref 96–112)
Creatinine, Ser: 1.2 mg/dL — ABNORMAL HIGH (ref 0.50–1.10)
Glucose, Bld: 84 mg/dL (ref 70–99)
HCT: 34 % — ABNORMAL LOW (ref 36.0–46.0)
Hemoglobin: 11.6 g/dL — ABNORMAL LOW (ref 12.0–15.0)
Potassium: 3.6 mEq/L (ref 3.5–5.1)
Sodium: 137 mEq/L (ref 135–145)
TCO2: 24 mmol/L (ref 0–100)

## 2010-11-05 LAB — D-DIMER, QUANTITATIVE: D-Dimer, Quant: <0.22 ug/mL-FEU (ref 0.00–0.48)

## 2010-11-05 LAB — CBC
HCT: 32.5 % — ABNORMAL LOW (ref 36.0–46.0)
Platelets: 269 10*3/uL (ref 150–400)
RBC: 3.46 MIL/uL — ABNORMAL LOW (ref 3.87–5.11)
RDW: 13.6 % (ref 11.5–15.5)
WBC: 5.7 10*3/uL (ref 4.0–10.5)

## 2010-11-05 LAB — DIFFERENTIAL
Basophils Absolute: 0 10*3/uL (ref 0.0–0.1)
Eosinophils Absolute: 0.2 10*3/uL (ref 0.0–0.7)
Eosinophils Relative: 4 % (ref 0–5)
Lymphocytes Relative: 35 % (ref 12–46)
Neutrophils Relative %: 55 % (ref 43–77)

## 2010-11-05 LAB — HEMOGLOBIN A1C
Hgb A1c MFr Bld: 5.6 % (ref <5.7)
Mean Plasma Glucose: 114 mg/dL (ref <117)

## 2010-11-05 LAB — CARDIAC PANEL(CRET KIN+CKTOT+MB+TROPI)
CK, MB: 3 ng/mL (ref 0.3–4.0)
Total CK: 131 U/L (ref 7–177)
Troponin I: <0.3 ng/mL (ref <0.30)

## 2010-11-05 LAB — POCT I-STAT TROPONIN I: Troponin i, poc: 0 ng/mL (ref 0.00–0.08)

## 2010-11-05 LAB — CK TOTAL AND CKMB (NOT AT ARMC)
CK, MB: 3.4 ng/mL (ref 0.3–4.0)
Relative Index: 2.3 (ref 0.0–2.5)

## 2010-11-05 LAB — LIPID PANEL
LDL Cholesterol: 94 mg/dL (ref 0–99)
VLDL: 11 mg/dL (ref 0–40)

## 2010-11-05 LAB — PROTIME-INR: INR: 0.94 (ref 0.00–1.49)

## 2010-11-05 NOTE — H&P (Signed)
I saw Ms Kelsey Zavala.  I discussed with Dr Ashley Royalty.  I agree with his findings and plans as documented in his not of admission.

## 2010-11-05 NOTE — H&P (Signed)
Kelsey Zavala is an 35 y.o. female.   Chief Complaint: Chest and R arm pain HPI: Kelsey Zavala is a 35 yo female with past history of HTN who presents to [place] emergency department with complaint of chest pain. Chest pain started this morning about 2-3 hours ago while she was at work. The pain is located in the middle of her chest and described as achiness. The pain does radiate down her right arm. She did have some numbness and tingling in her right arm prior to onset of chest pain this morning. She has had some associated shortness of breath finds that it is painful to take a deep breath. She has tried ibuprofen as well as Maalox and this was not helpful. She did receive aspirin and GI cocktail in ED which only helped a little. Family history significant for a cousin with an MI at age 39 as well as strong family history of coronary artery disease on her father's side. She denies any recent illness, trauma, nausea, cough, headache, palpitations, edema, PND, orthopnea.  Past Medical History  Diagnosis Date  . HTN (hypertension)   . GAD (generalized anxiety disorder)   . Genital herpes   . Depression   . History of abnormal Pap smear     Past Surgical History  Procedure Date  . Colposcopy     Family History  Problem Relation Age of Onset  . Heart attack Father   . Heart attack Cousin 39  . Diabetes Paternal Aunt   . Hypertension Mother   . Hypertension Father    Social History: Lives with three children and husband.  No tobacco or EtOH use.  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  Allergies: No Known Allergies  No current facility-administered medications on file as of 11/05/2010.   Medications Prior to Admission  Medication Sig Dispense Refill  . buPROPion (WELLBUTRIN) 75 MG tablet Take 75 mg by mouth daily. X 1 week then one by mouth two times a day x 1 week then one by mouth three times a day thereafter        Lisinopril/HCTZ 20/25mg tablet One tab PO daily    . valACYclovir (VALTREX)  500 MG tablet Take 500 mg by mouth daily. For suppression and 1 tablet by mouth two times a day x 3 days for outbreaks         Results for orders placed during the hospital encounter of 11/05/10 (from the past 48 hour(s))  DIFFERENTIAL     Status: Normal   Collection Time   11/05/10  7:20 AM      Component Value Range Comment   Neutrophils Relative 55  43 - 77 (%)    Neutro Abs 3.1  1.7 - 7.7 (K/uL)    Lymphocytes Relative 35  12 - 46 (%)    Lymphs Abs 2.0  0.7 - 4.0 (K/uL)    Monocytes Relative 6  3 - 12 (%)    Monocytes Absolute 0.3  0.1 - 1.0 (K/uL)    Eosinophils Relative 4  0 - 5 (%)    Eosinophils Absolute 0.2  0.0 - 0.7 (K/uL)    Basophils Relative 1  0 - 1 (%)    Basophils Absolute 0.0  0.0 - 0.1 (K/uL)   CBC     Status: Abnormal   Collection Time   11/05/10  7:20 AM      Component Value Range Comment   WBC 5.7  4.0 - 10.5 (K/uL)    RBC   3.46 (*) 3.87 - 5.11 (MIL/uL)    Hemoglobin 10.3 (*) 12.0 - 15.0 (g/dL)    HCT 32.5 (*) 36.0 - 46.0 (%)    MCV 93.9  78.0 - 100.0 (fL)    MCH 29.8  26.0 - 34.0 (pg)    MCHC 31.7  30.0 - 36.0 (g/dL)    RDW 13.6  11.5 - 15.5 (%)    Platelets 269  150 - 400 (K/uL)   PROTIME-INR     Status: Normal   Collection Time   11/05/10  7:20 AM      Component Value Range Comment   Prothrombin Time 12.8  11.6 - 15.2 (seconds)    INR 0.94  0.00 - 1.49    D-DIMER, QUANTITATIVE     Status: Normal   Collection Time   11/05/10  7:20 AM      Component Value Range Comment   D-Dimer, Quant <0.22  0.00 - 0.48 (ug/mL-FEU)   POCT I-STAT, CHEM 8     Status: Abnormal   Collection Time   11/05/10  7:41 AM      Component Value Range Comment   Sodium 137  135 - 145 (mEq/L)    Potassium 3.6  3.5 - 5.1 (mEq/L)    Chloride 102  96 - 112 (mEq/L)    BUN 20  6 - 23 (mg/dL)    Creatinine, Ser 1.20 (*) 0.50 - 1.10 (mg/dL)    Glucose, Bld 84  70 - 99 (mg/dL)    Calcium, Ion 1.18  1.12 - 1.32 (mmol/L)    TCO2 24  0 - 100 (mmol/L)    Hemoglobin 11.6 (*) 12.0 -  15.0 (g/dL)    HCT 34.0 (*) 36.0 - 46.0 (%)   POCT I-STAT TROPONIN I     Status: Normal   Collection Time   11/05/10  7:44 AM      Component Value Range Comment   Troponin i, poc 0.00  0.00 - 0.08 (ng/mL)    Comment 3             Dg Chest Portable 1 View  11/05/2010  *RADIOLOGY REPORT*  Clinical Data: Right chest and arm pain  PORTABLE CHEST - 1 VIEW  Comparison: Chest x-ray of 11/20/2009  Findings: The lungs are clear.  Mediastinal contours appear normal. The heart is within upper limits of normal.  No bony abnormality is seen.  IMPRESSION: Stable chest x-ray.  No active lung disease.  Original Report Authenticated By: PAUL D. BARRY, M.D.    ROS Per history of present illness Last menstrual period 10/26/2010.  Vitals:  BP 96/58 HR:85  RR: 11  Temp: 98.7 Sa02%: 100% on RA Physical Exam  Constitutional: She is oriented to person, place, and time. She appears well-developed.       Obese, NAD   HENT:  Head: Normocephalic and atraumatic.  Mouth/Throat: Oropharynx is clear and moist. No oropharyngeal exudate.  Eyes: Conjunctivae and EOM are normal. Pupils are equal, round, and reactive to light. No scleral icterus.  Neck: Normal range of motion. Neck supple. No thyromegaly present.  Cardiovascular: Normal rate, regular rhythm and normal heart sounds.  Exam reveals no gallop and no friction rub.   No murmur heard. Respiratory: Effort normal and breath sounds normal. No respiratory distress. She has no wheezes. She has no rales. She exhibits no tenderness.  GI: Soft. Bowel sounds are normal. She exhibits no distension. There is no tenderness.  Musculoskeletal: She exhibits no edema.         Lower legs equal and without calf tenderness  Neurological: She is alert and oriented to person, place, and time.  Skin: Skin is warm and dry. No rash noted.  Psychiatric: She has a normal mood and affect. Her behavior is normal. Judgment and thought content normal.     Assessment/Plan 1. Chest  pain: Chest pain is atypical in nature. Patient does have risk factor of hypertension as well as strong family history of coronary artery disease. Will admit and cycle cardiac enzymes. We will risk stratify with hemoglobin A1c, fasting lipid panel, TSH. I suspect that her chest pain is more pleuritic versus musculoskeletal, will use Tylenol as well as morphine for pain control. Repeat EKG tomorrow a.m. 2. Hypertension: Blood pressure is very well controlled on the to the point of hypotension. Currently on lisinopril/HCTZ 20/25 mg. We'll reduce this to lisinopril 10 and HCTZ 12.5 mg and monitor her blood pressure during hospitalization.   3. Renal insufficiency: Patient creatinine 1.2 today. Her baseline creatinine looks to be around 0.6-0.8. This is approximately a 38% increase in her baseline creatinine. She is currently on an ACE inhibitor and was taking ibuprofen for her current pain. Been on ACE inhibitor for one year. Given that her blood pressures are well controlled we will back down on her ACE inhibitor and gently hydrate with normal saline at 100 cc per hour for now. We'll recheck a basic metabolic panel tomorrow to see if her creatinine has improved. 4. Anemia: She does have a history of anemia with baseline Hgb around 10-11.  Hgb today looks to be at around baseline.  Does not look to be Fe deficient given normal RDW and MCV.  Will defer further workup to PCP at discharge.  5. FEN/GI:  Low Na Heart healthy, NS @100ml/hr 6. PPX: Heparin 5000 units SQ TID 7. Dispo: Pending r/o MI  Tillman Kazmierski 11/05/2010, 10:00 AM    

## 2010-11-05 NOTE — H&P (Addendum)
Kelsey Zavala is an 35 y.o. female.   Chief Complaint: Chest and R arm pain HPI: Kelsey Zavala is a 35 yo female with past history of HTN who presents to [facility] emergency department with complaint of chest pain. Chest pain started this morning about 2-3 hours ago while she was at work. The pain is located in the middle of her chest and described as achiness. The pain does radiate down her right arm. She did have some numbness and tingling in her right arm prior to onset of chest pain this morning. She has had some associated shortness of breath finds that it is painful to take a deep breath. She has tried ibuprofen as well as Maalox and this was not helpful. She did receive aspirin and GI cocktail in ED which only helped a little. Family history significant for a cousin with an MI at age 71 as well as strong family history of coronary artery disease on her father's side. She denies any recent illness, trauma, nausea, cough, headache, palpitations, edema, PND, orthopnea.  Past Medical History  Diagnosis Date  . HTN (hypertension)   . GAD (generalized anxiety disorder)   . Genital herpes   . Depression   . History of abnormal Pap smear     Past Surgical History  Procedure Date  . Colposcopy     Family History  Problem Relation Age of Onset  . Heart attack Father   . Heart attack Cousin 39  . Diabetes Paternal Aunt   . Hypertension Mother   . Hypertension Father    Social History: Lives with three children and husband.  No tobacco or EtOH use.  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  Allergies: No Known Allergies  No current facility-administered medications on file as of 11/05/2010.   Medications Prior to Admission  Medication Sig Dispense Refill  . buPROPion (WELLBUTRIN) 75 MG tablet Take 75 mg by mouth daily. X 1 week then one by mouth two times a day x 1 week then one by mouth three times a day thereafter        Lisinopril/HCTZ 20/25mg  tablet One tab PO daily    . valACYclovir (VALTREX)  500 MG tablet Take 500 mg by mouth daily. For suppression and 1 tablet by mouth two times a day x 3 days for outbreaks         Results for orders placed during the hospital encounter of 11/05/10 (from the past 48 hour(s))  DIFFERENTIAL     Status: Normal   Collection Time   11/05/10  7:20 AM      Component Value Range Comment   Neutrophils Relative 55  43 - 77 (%)    Neutro Abs 3.1  1.7 - 7.7 (K/uL)    Lymphocytes Relative 35  12 - 46 (%)    Lymphs Abs 2.0  0.7 - 4.0 (K/uL)    Monocytes Relative 6  3 - 12 (%)    Monocytes Absolute 0.3  0.1 - 1.0 (K/uL)    Eosinophils Relative 4  0 - 5 (%)    Eosinophils Absolute 0.2  0.0 - 0.7 (K/uL)    Basophils Relative 1  0 - 1 (%)    Basophils Absolute 0.0  0.0 - 0.1 (K/uL)   CBC     Status: Abnormal   Collection Time   11/05/10  7:20 AM      Component Value Range Comment   WBC 5.7  4.0 - 10.5 (K/uL)    RBC  3.46 (*) 3.87 - 5.11 (MIL/uL)    Hemoglobin 10.3 (*) 12.0 - 15.0 (g/dL)    HCT 16.1 (*) 09.6 - 46.0 (%)    MCV 93.9  78.0 - 100.0 (fL)    MCH 29.8  26.0 - 34.0 (pg)    MCHC 31.7  30.0 - 36.0 (g/dL)    RDW 04.5  40.9 - 81.1 (%)    Platelets 269  150 - 400 (K/uL)   PROTIME-INR     Status: Normal   Collection Time   11/05/10  7:20 AM      Component Value Range Comment   Prothrombin Time 12.8  11.6 - 15.2 (seconds)    INR 0.94  0.00 - 1.49    D-DIMER, QUANTITATIVE     Status: Normal   Collection Time   11/05/10  7:20 AM      Component Value Range Comment   D-Dimer, Quant <0.22  0.00 - 0.48 (ug/mL-FEU)   POCT I-STAT, CHEM 8     Status: Abnormal   Collection Time   11/05/10  7:41 AM      Component Value Range Comment   Sodium 137  135 - 145 (mEq/L)    Potassium 3.6  3.5 - 5.1 (mEq/L)    Chloride 102  96 - 112 (mEq/L)    BUN 20  6 - 23 (mg/dL)    Creatinine, Ser 9.14 (*) 0.50 - 1.10 (mg/dL)    Glucose, Bld 84  70 - 99 (mg/dL)    Calcium, Ion 7.82  1.12 - 1.32 (mmol/L)    TCO2 24  0 - 100 (mmol/L)    Hemoglobin 11.6 (*) 12.0 -  15.0 (g/dL)    HCT 95.6 (*) 21.3 - 46.0 (%)   POCT I-STAT TROPONIN I     Status: Normal   Collection Time   11/05/10  7:44 AM      Component Value Range Comment   Troponin i, poc 0.00  0.00 - 0.08 (ng/mL)    Comment 3             Dg Chest Portable 1 View  11/05/2010  *RADIOLOGY REPORT*  Clinical Data: Right chest and arm pain  PORTABLE CHEST - 1 VIEW  Comparison: Chest x-ray of 11/20/2009  Findings: The lungs are clear.  Mediastinal contours appear normal. The heart is within upper limits of normal.  No bony abnormality is seen.  IMPRESSION: Stable chest x-ray.  No active lung disease.  Original Report Authenticated By: Juline Patch, M.D.    ROS Per history of present illness Last menstrual period 10/26/2010.  Vitals:  BP 96/58 HR:85  RR: 11  Temp: 98.7 Sa02%: 100% on RA Physical Exam  Constitutional: She is oriented to person, place, and time. She appears well-developed.       Obese, NAD   HENT:  Head: Normocephalic and atraumatic.  Mouth/Throat: Oropharynx is clear and moist. No oropharyngeal exudate.  Eyes: Conjunctivae and EOM are normal. Pupils are equal, round, and reactive to light. No scleral icterus.  Neck: Normal range of motion. Neck supple. No thyromegaly present.  Cardiovascular: Normal rate, regular rhythm and normal heart sounds.  Exam reveals no gallop and no friction rub.   No murmur heard. Respiratory: Effort normal and breath sounds normal. No respiratory distress. She has no wheezes. She has no rales. She exhibits no tenderness.  GI: Soft. Bowel sounds are normal. She exhibits no distension. There is no tenderness.  Musculoskeletal: She exhibits no edema.  Lower legs equal and without calf tenderness  Neurological: She is alert and oriented to person, place, and time.  Skin: Skin is warm and dry. No rash noted.  Psychiatric: She has a normal mood and affect. Her behavior is normal. Judgment and thought content normal.     Assessment/Plan 1. Chest  pain: Chest pain is atypical in nature. Patient does have risk factor of hypertension as well as strong family history of coronary artery disease. Will admit and cycle cardiac enzymes. We will risk stratify with hemoglobin A1c, fasting lipid panel, TSH. I suspect that her chest pain is more pleuritic versus musculoskeletal, will use Tylenol as well as morphine for pain control. Repeat EKG tomorrow a.m. 2. Hypertension: Blood pressure is very well controlled on the to the point of hypotension. Currently on lisinopril/HCTZ 20/25 mg. We'll reduce this to lisinopril 10 and HCTZ 12.5 mg and monitor her blood pressure during hospitalization.   3. Renal insufficiency: Patient creatinine 1.2 today. Her baseline creatinine looks to be around 0.6-0.8. This is approximately a 38% increase in her baseline creatinine. She is currently on an ACE inhibitor and was taking ibuprofen for her current pain. Been on ACE inhibitor for one year. Given that her blood pressures are well controlled we will back down on her ACE inhibitor and gently hydrate with normal saline at 100 cc per hour for now. We'll recheck a basic metabolic panel tomorrow to see if her creatinine has improved. 4. Anemia: She does have a history of anemia with baseline Hgb around 10-11.  Hgb today looks to be at around baseline.  Does not look to be Fe deficient given normal RDW and MCV.  Will defer further workup to PCP at discharge.  5. FEN/GI:  Low Na Heart healthy, NS @100ml /hr 6. PPX: Heparin 5000 units SQ TID 7. Dispo: Pending r/o MI  Irania Durell 11/05/2010, 10:00 AM

## 2010-11-06 LAB — BASIC METABOLIC PANEL
BUN: 11 mg/dL (ref 6–23)
Calcium: 8.7 mg/dL (ref 8.4–10.5)
Creatinine, Ser: 0.7 mg/dL (ref 0.50–1.10)
GFR calc Af Amer: >90 mL/min (ref >90)
GFR calc non Af Amer: >90 mL/min (ref >90)

## 2010-11-06 NOTE — Discharge Summary (Signed)
  Discharge Summary 11/06/2010 2:07 PM  Kelsey Zavala 09/25/1975 [id number]  Date of Admission: 11/05/2010 Date of Discharge: 11/06/2010  PCP: Renold Don, MD Consultants: none  Reason for Admission: Rule out chest pain  Discharge Diagnosis Primary 1. Atypical Chest Pain Secondary 1. Hypertension 2. Renal insufficiency, resolved  Hospital Course: 1. Atypical chest pain: Ms. 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  The pain was worse with deep inspiration, however does have several risk factors for cardiac events including obesity, hypertension and family history or early CAD/MI.  An EKG, CXR, and D-dimer in the ED were normal and cardiac enzymes were negative.  She was admitted for a chest pain rule out.  Cardiac enzymes remained negative x3, and the pain localized more to the right shoulder at time of discharge.  Pain is likely secondary to MSK vs pleuritis.  Risk stratification labs were obtained and a Framingham risk score was <1%.  2. Renal Insufficiency: On admission her creatinine was noted to be elevated to 1.20 which is above her baseline of 0.7-0.8.  She was started on IVF fluids and repeat labs showed a resolution with a creatinine of 0.7.  3. Hypertension: On admission, she reported on episode of dizziness with standing, and her blood pressure was low at 96/58.  Her home medication of lisinopril/HCTZ 20/25 was decreased to 10/12.5 and her blood pressure was well controlled on the lower dose.  Procedures: none  Discharge Medications New 1. Vicodin 5/500mg , 1 tab qHS prn for pain, #10 2. Lisinopril/HCTZ 10/12.5mg  daily Continued 1. Motrin 200mg , 2-4 tabs q8hr prn 2. MVI 3. Valtrex 500mg , 1 tab BID as needed Discontinued 1. Lisinopril/HCTZ 20/25mg   Pertinent Hospital Labs Lipid Panel     Component Value Date/Time   CHOL 166 11/05/2010 0936   TRIG 53 11/05/2010 0936   HDL 61  11/05/2010 0936   CHOLHDL 2.7 11/05/2010 0936   VLDL 11 11/05/2010 0936   LDLCALC 94 11/05/2010 0936   HbA1c: 5.6  TSH: 0.579  Discharge instructions: Patient instructed to take motrin 600mg  with meals for the next 3-5 days.  Vicodin was prescribed to be used at bedtime if needed for pain.  She was instructed to call PCP or return to ED for additional episodes of chest pain.  Condition at discharge: stable  Disposition: home  Pending Tests: none  Follow up:  Dr. Gwendolyn Grant and [facility] on Wednesday 11/7 at 3:30pm  Follow up Issues: Please evaluate for resolution of right shoulder pain.  She requested a nutrition consult while in the hospital, I have sent a message via EPIC to Dr. Gerilyn Pilgrim.  BOOTH, ERIN 11/06/2010, 2:08 PM

## 2010-11-10 ENCOUNTER — Telehealth: Payer: Self-pay | Admitting: Family Medicine

## 2010-11-10 NOTE — Telephone Encounter (Signed)
Patient admitted for chest pain.  Was ruled as musculoskeletal.  Patient received heparin while there.  She started her cycle yesterday and was not due to start until 11/13.  Was wondering if the heparin brought it on early.  She is flowing heavy.  Consulted with Dr. Jennette Kettle who said it was not due to heparin, was probably due to stress.  Told patient that if she was still flowing this Friday to call me back.

## 2010-11-10 NOTE — Telephone Encounter (Signed)
Ms. Zwilling was DC'd from the hospital a few days ago and has questions about her Dx and would like some direction.

## 2010-11-12 ENCOUNTER — Ambulatory Visit (INDEPENDENT_AMBULATORY_CARE_PROVIDER_SITE_OTHER): Payer: 59 | Admitting: Family Medicine

## 2010-11-12 ENCOUNTER — Encounter: Payer: Self-pay | Admitting: Family Medicine

## 2010-11-12 DIAGNOSIS — E669 Obesity, unspecified: Secondary | ICD-10-CM

## 2010-11-12 DIAGNOSIS — N898 Other specified noninflammatory disorders of vagina: Secondary | ICD-10-CM

## 2010-11-12 DIAGNOSIS — F411 Generalized anxiety disorder: Secondary | ICD-10-CM

## 2010-11-12 DIAGNOSIS — N939 Abnormal uterine and vaginal bleeding, unspecified: Secondary | ICD-10-CM

## 2010-11-12 MED ORDER — ADAPALENE-BENZOYL PEROXIDE 0.1-2.5 % EX GEL: 1.0000 | Freq: Every day | CUTANEOUS | Status: DC

## 2010-11-12 MED ORDER — LISINOPRIL-HYDROCHLOROTHIAZIDE 10-12.5 MG PO TABS: 1.0000 | ORAL_TABLET | Freq: Every day | ORAL | Status: DC

## 2010-11-12 NOTE — Patient Instructions (Signed)
Come back and see me in 2 weeks. Make an appt to see Dr. Gerilyn Pilgrim whenever available. I'll send in the blood pressure medicine right now.

## 2010-11-12 NOTE — Progress Notes (Signed)
  Subjective:    Patient ID: Kelsey Zavala, female    DOB: August 14, 1975, 35 y.o.   MRN: [id number]  HPI  1. CP hospital FU:  Recently admitted for what sounds like panic attack based on symptoms.  No cardiac/lung process found in hospital.  Still has mildly persistent pain that has improved with daily 200 - 400 mg Ibuprofen.  Anxiety has increased due to stress levels at work and at home.  No further attacks since leaving hospital.  No SI/HI  2.  Vaginal bleeding:  Started with heavy vaginal bleeding last Friday. Soaks through multiple pads and tampons until Monday when it stopped. Bleeding was in today a much lighter. She's been able to get by only using one to 2 pads today. No pain whatsoever no abdominal cramping. No chest pain, palpitations.  3.  Acne:  Would like to try Epiduo.  Has tried other OTC products without relief.  Has been seen by Dr. Terri Piedra in past for derm concerns.   4.  Weight loss:  Very motivated.  Wants to meet Dr. Gerilyn Pilgrim and discuss weight loss with her.  Frustrated that she has gained 40 lbs over past 2 years (reviewed weight with her from records).  States she was happy in hospital with portion control, was interested to learn exactly what 4 oz of meat really means. Has tried multiple workout videos and diets, but feels she really needs some direction.  States she's too out of shape to start with some of the more intense workout videos, doesn't have any guidance as far as easier workouts.  Mostly just walking for exercise now.     Review of Systems See HPI above for review of systems.       Objective:   Physical Exam Gen:  Alert, cooperative patient who appears stated age in no acute distress.  Vital signs reviewed. Cardiac:  Regular rate and rhythm without murmur auscultated.  Good S1/S2. Pulm:  Clear to auscultation bilaterally with good air movement.  No wheezes or rales noted.   Abd:  Obese.  Nontender Ext:  No clubbing/cyanosis/erythema.  No edema noted bilateral lower  extremities.   Psych:  Pleasant, conversant, not anxious or depressed appearing.        Assessment & Plan:

## 2010-11-13 ENCOUNTER — Encounter: Payer: Self-pay | Admitting: Family Medicine

## 2010-11-13 DIAGNOSIS — N939 Abnormal uterine and vaginal bleeding, unspecified: Secondary | ICD-10-CM | POA: Insufficient documentation

## 2010-11-13 NOTE — Assessment & Plan Note (Signed)
Believe this was triggering effect for recent hospitalization.   Discussed SSRI/benzo treatement for anxiety. Would like to attempt non-pharmacologic attempts first. She is following up with me in 2 weeks, gave strict warnings and invitation to return back immediately before then if she expereinces increasing stress or similar symptoms of panic attack.   Will discuss again SSRI, even for only 9-12 months, at next visit.

## 2010-11-13 NOTE — Assessment & Plan Note (Signed)
Lightening currently. No red flags, non-symptomatic Possibly due to recent stress Recommended 800 mg of Ibuprofen to help with bleeding. To return if any further symptoms or bleeding recurs. May need 5-10 day trial of progesterone if bleeding continues.

## 2010-11-13 NOTE — Assessment & Plan Note (Signed)
Long discussion with patient about setting reasonable goals rather than focusing on trying to lose 40 lbs in next week or month. Discussed healthy food options, increased walking distances. Patient very motivated. WIll refer to Dr. Gerilyn Pilgrim.

## 2011-02-06 ENCOUNTER — Encounter: Payer: Self-pay | Admitting: Family Medicine

## 2011-02-06 ENCOUNTER — Ambulatory Visit (INDEPENDENT_AMBULATORY_CARE_PROVIDER_SITE_OTHER): Payer: 59 | Admitting: Family Medicine

## 2011-02-06 DIAGNOSIS — M545 Low back pain, unspecified: Secondary | ICD-10-CM

## 2011-02-06 DIAGNOSIS — F3289 Other specified depressive episodes: Secondary | ICD-10-CM

## 2011-02-06 DIAGNOSIS — F329 Major depressive disorder, single episode, unspecified: Secondary | ICD-10-CM

## 2011-02-06 MED ORDER — CYCLOBENZAPRINE HCL 10 MG PO TABS: 10.0000 mg | ORAL_TABLET | Freq: Three times a day (TID) | ORAL | Status: AC | PRN

## 2011-02-06 MED ORDER — VENLAFAXINE HCL 37.5 MG PO TABS: 37.5000 mg | ORAL_TABLET | Freq: Two times a day (BID) | ORAL | Status: DC

## 2011-02-06 NOTE — Patient Instructions (Signed)
Take the muscle relaxer at night first because it might make you a little sleepy. You can take this up to 3 times a day but I would recommend not driving with it. Massage, heat, having someone rub on the area will also help. Chiropractic can also help relieve your back.  Take the Effexor one pill in the morning and one pill in the evening.  Take it a little earlier in the evening so you're not having trouble sleeping. You may feel a little anxiety when you first start taking this medicine. Call the [facility] at [phone] and ask about counseling and therapy.  Come back and see me in 2 weeks

## 2011-02-09 DIAGNOSIS — M545 Low back pain, unspecified: Secondary | ICD-10-CM | POA: Insufficient documentation

## 2011-02-09 NOTE — Progress Notes (Signed)
  Subjective:    Patient ID: Kelsey Zavala, female    DOB: 1975-06-30, 36 y.o.   MRN: [id number]  HPI 1.  Depression:  Patient has been experiencing increasing stressors at home and work.  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  Children struggling behaviorally and at school.  Experiencing increasing depressive symptoms.  She is concerned about medication and would like therapist to speak with.  No SI/HI.  No manic symptoms.  2. Back pain:  Worse BL lumbar region.  Lifting heavy patients at work.  No acute trauma or strain, just awoke with more pain in back several days ago.  Tylenol OTC with limited relief.  No urinary/bowel incontinence  The following portions of the patient's history were reviewed and updated as appropriate: allergies, current medications, past medical history, family and social history, and problem list, including prior office visit notes and discussion on depression 25 minutes, >35 minutes time on patient's case. Review of Systems See HPI above for review of systems.       Objective:   Physical Exam Gen:  Alert, cooperative patient who appears stated age in no acute distress.  Vital signs reviewed. Neck:  No thyromegaly Cardiac:  Regular rate and rhythm without murmur auscultated.  Good S1/S2. Pulm:  Clear to auscultation bilaterally with good air movement.  No wheezes or rales noted.   MSK:  TTP BL lumbar region.  SLR positive for back pain.  Palpable cord noted RIght side paraspinal muscles. Psych:  Depressed appearing.  Tearful       Assessment & Plan:

## 2011-02-09 NOTE — Assessment & Plan Note (Signed)
MSK in nature. No red flags. Treat with combination of muscle relaxer and heat/massage. FU in 2 weeks for improvement.  Depression likely worsening this

## 2011-02-09 NOTE — Assessment & Plan Note (Signed)
Has taken Welbutrin in past but did not feel like this helped.   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 After 25 minute discussion about treatment, patient amenable to starting medication. FU in 2 weeks.  Provided [place] [facility] referral info

## 2011-02-13 ENCOUNTER — Telehealth: Payer: Self-pay | Admitting: Family Medicine

## 2011-02-13 NOTE — Telephone Encounter (Signed)
FMLA forms placed in Dr. Tyson Alias box for completion.  Kelsey Zavala

## 2011-02-13 NOTE — Telephone Encounter (Signed)
Kelsey Zavala brings in [employer] paperwork to be completed by Gwendolyn Grant. Pt would like to pick paperwork up personally.

## 2011-02-19 ENCOUNTER — Ambulatory Visit (INDEPENDENT_AMBULATORY_CARE_PROVIDER_SITE_OTHER): Payer: 59 | Admitting: Family Medicine

## 2011-02-19 ENCOUNTER — Encounter: Payer: Self-pay | Admitting: Family Medicine

## 2011-02-19 DIAGNOSIS — M545 Low back pain, unspecified: Secondary | ICD-10-CM

## 2011-02-19 DIAGNOSIS — F329 Major depressive disorder, single episode, unspecified: Secondary | ICD-10-CM

## 2011-02-19 DIAGNOSIS — L709 Acne, unspecified: Secondary | ICD-10-CM

## 2011-02-19 DIAGNOSIS — A6000 Herpesviral infection of urogenital system, unspecified: Secondary | ICD-10-CM

## 2011-02-19 DIAGNOSIS — F3289 Other specified depressive episodes: Secondary | ICD-10-CM

## 2011-02-19 DIAGNOSIS — L708 Other acne: Secondary | ICD-10-CM

## 2011-02-19 MED ORDER — BENZOYL PEROXIDE 4 % EX GEL: Freq: Every day | CUTANEOUS | Status: AC

## 2011-02-19 MED ORDER — ADAPALENE 0.1 % EX CREA: TOPICAL_CREAM | Freq: Every day | CUTANEOUS | Status: AC

## 2011-02-19 MED ORDER — LORAZEPAM 1 MG PO TABS: 1.0000 mg | ORAL_TABLET | Freq: Every evening | ORAL | Status: AC | PRN

## 2011-02-19 MED ORDER — VALACYCLOVIR HCL 500 MG PO TABS: 500.0000 mg | ORAL_TABLET | Freq: Every day | ORAL | Status: DC

## 2011-02-19 MED ORDER — CITALOPRAM HYDROBROMIDE 20 MG PO TABS: 20.0000 mg | ORAL_TABLET | Freq: Every day | ORAL | Status: DC

## 2011-02-19 NOTE — Patient Instructions (Signed)
Take the Ativan one pill at nighttime to help with anxiety and to also help you sleep. The other medicine that we will be starting today his Celexa. Take this one pill in the morning. This will be the one that will take about 10 days to really start to kick in. Talk to the front desk on the way out and asked to make an appointment with Dr. Pascal Lux. I will also send her your information to see if we can get you in somewhere sooner.

## 2011-02-19 NOTE — Progress Notes (Signed)
  Subjective:    Patient ID: Kelsey Zavala, female    DOB: 1975-04-29, 36 y.o.   MRN: [id number]  HPI #1. Depression/anxiety: Patient unable to afford Effexor. Has discussed with her job time off of work and they are supportive.  Brings FMLA paperwork to be filled.  Still with stressors at home and work.  Also with anxiety when she is not doing anything or trying to fall asleep at night, describes racing thoughts.  No SI/HI.  Feels also that therapy/counseling would be most beneficial for her.  #2. Herpes flare:  Describes vaginal tingling and burning typical of when she has herpes recurrence.  No visible lesions yet.  No unprotected sexual intercourse or new sexual partners.  No vaginal itching or burning.  NO discharge.  #3 back pain: Much improved. She states she is still taking Flexeril that this is helping her with her pain. She is also alternating heat and massage. She has also been set up with a chiropractor her first appointment will be next Thursday. She is excited about this. The fevers or chills.  4.  Acne:  Epiduo too expensive.  Requesting something different.  Washing face daily with soap and water.     The following portions of the patient's history were reviewed and updated as appropriate: allergies, current medications, past medical history, family and social history, and problem list, including prior OV notes.    Review of Systems See HPI above for review of systems.       Objective:   Physical Exam Gen:  Alert, cooperative patient who appears stated age in no acute distress.  Vital signs reviewed. Cardiac:  Regular rate and rhythm without murmur auscultated.  Good S1/S2. Pulm:  Clear to auscultation bilaterally with good air movement.  No wheezes or rales noted.   Abd:  Soft/nondistended/nontender.  Good bowel sounds throughout all four quadrants.  No masses noted.  Back - Normal skin, Spine with normal alignment and no deformity.  No tenderness to vertebral process  palpation.  Paraspinous muscles are not tender and without spasm.   Range of motion is full at neck and lumbar sacral regions Psych:  Somewhat depressed appearing.         Assessment & Plan:

## 2011-02-20 NOTE — Telephone Encounter (Signed)
Completed and given to patient in person at appt yesterday.

## 2011-02-23 ENCOUNTER — Telehealth: Payer: Self-pay | Admitting: Psychology

## 2011-02-23 DIAGNOSIS — L709 Acne, unspecified: Secondary | ICD-10-CM | POA: Insufficient documentation

## 2011-02-23 NOTE — Assessment & Plan Note (Signed)
Now with some component of anxiety. Likely present before as well, but focus last visit was on depression. Plan 2 week benzo bridge to Celexa as she could not afford Effexor and Bupropion has not helped in past. She is on waiting list for [facility] but would like to get in somewhere else sooner.   Will forward to Dr. Pascal Lux and see if she has any other ideas for someone with insurance.

## 2011-02-23 NOTE — Telephone Encounter (Signed)
Patient left VM requesting appt.  Reviewed record.  My first opening is several weeks out.  With her insurance, she could see Letha Cape [phone]) a person I refer to regularly.  Provided her that contact information and asked that she get back in touch with me if she encountered any difficulty.  Will let PCP know.

## 2011-02-23 NOTE — Assessment & Plan Note (Signed)
Prescribed Adapalene and Benzoyl peroxide as two separate treatments rather than combined so she can afford this

## 2011-02-23 NOTE — Assessment & Plan Note (Signed)
Much improved. Continue Baclofen and PT for relief. FU with chiropractor

## 2011-02-23 NOTE — Assessment & Plan Note (Signed)
Refilled valtrex  

## 2011-03-02 ENCOUNTER — Telehealth: Payer: Self-pay | Admitting: Clinical

## 2011-03-02 NOTE — Telephone Encounter (Signed)
Clinical Child psychotherapist (CSW) received referral to assist with providing/referring pt to an outpatient counseling agency. CSW contacted pt who informed CSW that [facility] [place] and was on her way to the agency to see if they had appointments available. CSW explored whether CSW could assist with counseling however CSW informed that it will need to be by an agency for insurance purposes. CSW referred pt to [facility] of the [place] [phone] who maybe able to get pt in for an appointment as early as the next few days. Pt was very appreciative and stated she would contact agency today. CSW encouraged pt to call CSW if she had challenges in scheduling an appointment.   Theresia Bough, MSW, Theresia Majors [phone]

## 2011-03-03 NOTE — Telephone Encounter (Signed)
error 

## 2011-03-26 ENCOUNTER — Telehealth: Payer: Self-pay | Admitting: Family Medicine

## 2011-03-26 MED ORDER — SERTRALINE HCL 50 MG PO TABS: 50.0000 mg | ORAL_TABLET | Freq: Every day | ORAL | Status: DC

## 2011-03-26 NOTE — Telephone Encounter (Signed)
Kelsey Zavala need the forms faxed from the [facility] company completed and faxed back to them so that she can receive her short term disability payments.  This is the second request sent to you to respond.  They have not received anything and she need this done asap.  If there is any reason for the delay, please contact Kelsey Zavala to explain.

## 2011-03-26 NOTE — Telephone Encounter (Signed)
She also requested switch to Zoloft.  Will start this today.

## 2011-03-27 NOTE — Telephone Encounter (Signed)
Paperwork was signed and placed in to be called box yesterday.  Also told Kelsey Zavala.

## 2011-04-14 ENCOUNTER — Ambulatory Visit (INDEPENDENT_AMBULATORY_CARE_PROVIDER_SITE_OTHER): Payer: 59 | Admitting: Family Medicine

## 2011-04-14 ENCOUNTER — Encounter: Payer: Self-pay | Admitting: Family Medicine

## 2011-04-14 VITALS — BP 134/86 | HR 66 | Temp 98.3°F | Ht 68.5 in | Wt 284.9 lb

## 2011-04-14 DIAGNOSIS — F329 Major depressive disorder, single episode, unspecified: Secondary | ICD-10-CM

## 2011-04-14 DIAGNOSIS — F3289 Other specified depressive episodes: Secondary | ICD-10-CM

## 2011-04-14 MED ORDER — SERTRALINE HCL 50 MG PO TABS: 50.0000 mg | ORAL_TABLET | Freq: Every day | ORAL | Status: DC

## 2011-04-14 NOTE — Progress Notes (Signed)
  Subjective:    Patient ID: Kelsey Zavala, female    DOB: Feb 11, 1975, 36 y.o.   MRN: [id number]  HPI  1.  Depression:  Being seen at [facility].  Hasn't been seen there since end of February, no availabilities in March.  Next appt is April 16, then April 24.  No openings until July.  Started on Zoloft 50 mg, feels this has helped.  Is worried about going up on medication.  Feels that the breathing exercises provided by therapist at [facility] has also greatly helped.  Is doing music therapy, exercising more and feels this has also greatly helped.  Is thinking about going back to work around May 6.  Would like to gain a few more coping skills from therapist at [facility].  Again feels this has greatly helped her.  Also provided with resources to help with family members at home.   Feels that stress triggered by work and school.  Expresses easy irritability.    Does not some leg swelling since she started Zoloft.  Actively decreased salt since starting Zoloft.    Review of Systems No suicidal or homicidal ideations.  No fevers or chills. No heat/cold intolerance.      Objective:   Physical Exam Gen:  Alert, cooperative patient who appears stated age in no acute distress.  Vital signs reviewed. HEENT:  [place]/AT.  MMM Cardiac:  Regular rate and rhythm without murmur auscultated.  Good S1/S2. Pulm:  Clear to auscultation bilaterally with good air movement.  No wheezes or rales noted.   Abdomen:  S/ND/NT Ext:  No evident edema today Psych: Much more interactive and engaged.  Conversant, linear thought process.  Not anxious or depressed appearing today        Assessment & Plan:

## 2011-04-14 NOTE — Patient Instructions (Signed)
Make an appt to see me the last week of April so we can see how you're doing. I'm glad things are going so well so far.  We'll keep the Zoloft the same.

## 2011-04-16 ENCOUNTER — Ambulatory Visit: Payer: 59 | Admitting: Family Medicine

## 2011-04-16 NOTE — Assessment & Plan Note (Signed)
I feel she is greatly improving.  Zoloft has also seemed to help.  Plan to continue this for next 3 months at least.  Triggers appear to be both work and family stress related.   She is doing well with coping mechanisms provided by therapist.   Real test will be once she restarts working.  She has 2 more follow-ups with therapist.  At that point she will probably be ready to ease back into work.   FU in 2 weeks

## 2011-04-17 ENCOUNTER — Telehealth: Payer: Self-pay | Admitting: Family Medicine

## 2011-04-17 NOTE — Telephone Encounter (Signed)
LVM for patient to call back. Paperwork was faxed out on 03/26/2011, most office visit note was then requested. This has been faxed out today.

## 2011-04-17 NOTE — Telephone Encounter (Signed)
Patient is calling to find out what the status of the paperwork for the [facility].

## 2011-04-17 NOTE — Telephone Encounter (Signed)
Spoke with patient and informed her of below 

## 2011-04-30 ENCOUNTER — Encounter: Payer: Self-pay | Admitting: Family Medicine

## 2011-04-30 ENCOUNTER — Telehealth: Payer: Self-pay | Admitting: *Deleted

## 2011-04-30 ENCOUNTER — Ambulatory Visit (INDEPENDENT_AMBULATORY_CARE_PROVIDER_SITE_OTHER): Payer: 59 | Admitting: Family Medicine

## 2011-04-30 VITALS — BP 114/76 | HR 72 | Temp 98.8°F | Ht 68.5 in | Wt 282.5 lb

## 2011-04-30 DIAGNOSIS — F329 Major depressive disorder, single episode, unspecified: Secondary | ICD-10-CM

## 2011-04-30 DIAGNOSIS — F3289 Other specified depressive episodes: Secondary | ICD-10-CM

## 2011-04-30 NOTE — Telephone Encounter (Signed)
Spoke with patient and informed her that I have set paperwork up front to be picked up

## 2011-04-30 NOTE — Assessment & Plan Note (Signed)
Much improved. She plans to go back to work May 8. I think she has responded appropriately to her major depressive episode and I am pleased by the initiative she has taken in order to help herself, such as by decreasing the number of hours worked, changing her shift, going part-time for school. She's had a lot of stressors on her including family and work life and she is reacting appropriately. 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 She has taken it upon herself to seek counseling for both her and her son to improve their relationship. Since she is doing so well on the medication no changes in medications today and we will continue her on the Zoloft at her current dosage. She also needs documentation for both work and school and I will provide that.   Plan for follow-up with me in 1 month after she has returned to work.  At that time, if she is still doing so well we can increase the time between follow-ups.

## 2011-04-30 NOTE — Progress Notes (Signed)
Patient ID: Kelsey Zavala, female   DOB: 06-29-1975, 36 y.o.   MRN: [id number] Kelsey Zavala is a 36 y.o. female who returns to clinic today for followup on her depression.  #1. Depression: Patient is doing much better overall. She has a good action plan in place. She is talked with her work and is switching her shift hours as well as decreasing the amount of hours working. She is also discussed going part-time at school to decrease her work load. Furthermore she is continuing monthly basis based counseling as well as more often using a counseling hotline through her therapy organization. Her main triggers has been dealing with her teenage son. She is also enrolled in frontal/out family counseling and a program called [facility].  She states this is further helped her with her daily stress levels in managing her son.  She states she is also doing well on the sertraline. She is now on 50 mg of this today.  She describes this as a "miracle drug."  She is able to focus more well in class. It also helps decrease her stress levels while at home. She takes this about 2 PM because she was having trouble previously at the end of the day and she took this first thing in the morning. She does not know she is to increase this dose is at optimal dose currently.   The following portions of the patient's history were reviewed and updated as appropriate: allergies, current medications, past medical history, family and social history, and problem list.  Patient is a nonsmoker.   ROS as above otherwise neg. No Chest pain, palpitations, SOB, Fever, Chills, Abd pain, N/V/D.  Medications reviewed. Current Outpatient Prescriptions  Medication Sig Dispense Refill  . adapalene (DIFFERIN) 0.1 % cream Apply topically at bedtime.  45 g  2  . benzoyl peroxide (BREVOXYL) 4 % gel Apply topically at bedtime.  42.5 g  2  . cyclobenzaprine (FLEXERIL) 10 MG tablet       . fluconazole (DIFLUCAN) 150 MG tablet       . ibuprofen  (ADVIL,MOTRIN) 200 MG tablet Take 400-800 mg by mouth every 6 (six) hours as needed. Takes 2 to 4 tablets for pain       . lisinopril-hydrochlorothiazide (PRINZIDE,ZESTORETIC) 10-12.5 MG per tablet Take 1 tablet by mouth daily.  90 tablet  3  . Multiple Vitamins-Minerals (MULTIVITAMINS THER. W/MINERALS) TABS Take 1 tablet by mouth daily.        . sertraline (ZOLOFT) 50 MG tablet Take 1 tablet (50 mg total) by mouth daily.  30 tablet  1  . valACYclovir (VALTREX) 500 MG tablet Take 1 tablet (500 mg total) by mouth daily. For suppression and 1 tablet by mouth two times a day x 3 days for outbreaks  31 tablet  2  . DISCONTD: citalopram (CELEXA) 20 MG tablet Take 1 tablet (20 mg total) by mouth daily.  30 tablet  3    Exam:  BP 114/76  Pulse 72  Temp(Src) 98.8 F (37.1 C) (Oral)  Ht 5' 8.5" (1.74 m)  Wt 282 lb 8 oz (128.141 kg)  BMI 42.33 kg/m2 Gen: Well NAD HEENT: EOMI,  MMM Lungs: CTABL Nl WOB Heart: RRR no MRG Abd: NABS, NT, ND Psych: Pleasant and conversant. Linear thought process. Not anxious appearing  No results found for this or any previous visit (from the past 72 hour(s)).

## 2011-05-05 ENCOUNTER — Encounter: Payer: Self-pay | Admitting: Family Medicine

## 2011-05-05 ENCOUNTER — Telehealth: Payer: Self-pay | Admitting: Family Medicine

## 2011-05-05 NOTE — Telephone Encounter (Signed)
Ms. Dehnert need a note giving date of return to work.  Received letter from primary for dates to be out, but never received one to go back.  She is needing this no later than May 6th to give to her employer.

## 2011-05-05 NOTE — Telephone Encounter (Signed)
Sent letter for pt to return to work 5/8 as per Dr. Tyson Alias last note

## 2011-05-07 ENCOUNTER — Telehealth: Payer: Self-pay | Admitting: Family Medicine

## 2011-05-07 NOTE — Telephone Encounter (Signed)
Patient dropped off papers to be filled out for disability. Please call her when completed.

## 2011-05-08 NOTE — Telephone Encounter (Signed)
[employer] Statement of Disability for Mental Health Claims placed in Dr. Tobias Alexander box, who is covering for Dr Gwendolyn Grant, to see if she can feel out these forms for Kelsey Zavala.  Kelsey Zavala

## 2011-05-10 NOTE — Telephone Encounter (Signed)
Called pt today- filled out form to the best of my ability for Dr. Gwendolyn Grant, I got information from the chart and office notes and I also called pt to answer some of the questions.  Form completed and placed in the "to be called for pickup" pile in the administrative office.

## 2011-05-11 NOTE — Telephone Encounter (Signed)
Tashema notified disability forms can be picked up at front desk.  Ileana Ladd

## 2011-06-11 ENCOUNTER — Telehealth: Payer: Self-pay | Admitting: Family Medicine

## 2011-06-11 NOTE — Telephone Encounter (Signed)
Patient dropped off Long term disability papers to be filled out.  Please fax when completed.

## 2011-06-15 NOTE — Telephone Encounter (Signed)
Completed and placed in "to be called" box.  

## 2011-09-29 ENCOUNTER — Other Ambulatory Visit: Payer: Self-pay | Admitting: Family Medicine

## 2011-10-02 ENCOUNTER — Ambulatory Visit: Payer: 59 | Admitting: Family Medicine

## 2011-10-26 ENCOUNTER — Other Ambulatory Visit: Payer: Self-pay | Admitting: Family Medicine

## 2011-10-30 ENCOUNTER — Ambulatory Visit: Payer: 59

## 2011-11-06 ENCOUNTER — Ambulatory Visit (INDEPENDENT_AMBULATORY_CARE_PROVIDER_SITE_OTHER): Payer: 59

## 2011-11-06 DIAGNOSIS — Z23 Encounter for immunization: Secondary | ICD-10-CM

## 2011-11-27 ENCOUNTER — Other Ambulatory Visit: Payer: Self-pay | Admitting: Family Medicine

## 2011-12-02 ENCOUNTER — Encounter: Payer: Self-pay | Admitting: Family Medicine

## 2011-12-02 ENCOUNTER — Ambulatory Visit (INDEPENDENT_AMBULATORY_CARE_PROVIDER_SITE_OTHER): Payer: 59 | Admitting: Family Medicine

## 2011-12-02 VITALS — BP 132/82 | HR 88 | Temp 98.1°F | Ht 68.5 in | Wt 293.0 lb

## 2011-12-02 DIAGNOSIS — M545 Low back pain, unspecified: Secondary | ICD-10-CM

## 2011-12-02 DIAGNOSIS — R609 Edema, unspecified: Secondary | ICD-10-CM

## 2011-12-02 DIAGNOSIS — D649 Anemia, unspecified: Secondary | ICD-10-CM

## 2011-12-02 LAB — CBC
MCHC: 33.2 g/dL (ref 30.0–36.0)
Platelets: 304 10*3/uL (ref 150–400)
RDW: 13.8 % (ref 11.5–15.5)
WBC: 5.9 10*3/uL (ref 4.0–10.5)

## 2011-12-02 MED ORDER — CYCLOBENZAPRINE HCL 10 MG PO TABS: 10.0000 mg | ORAL_TABLET | Freq: Two times a day (BID) | ORAL | Status: DC | PRN

## 2011-12-02 NOTE — Assessment & Plan Note (Addendum)
Worsening due to exacerbation.  No signs of infection or fracture.  Likely over use.  Seems to be a component of depression/dysthymia - she did not really want to talk about it but did acknowledge stress was an issue.   Recommended she follow up with her pcp to address this.  Did write letter for work

## 2011-12-02 NOTE — Progress Notes (Signed)
  Subjective:    Patient ID: Kelsey Zavala, female    DOB: 02/21/1975, 36 y.o.   MRN: [id number]  HPI  Low back pain Bilateral slight radiation to both hips.  Has had before.  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   No incontinence or weakness or fever.  Is better with ibuprofen 400 mg.   Flexaril helped in past  Feet Swelling For the last few days.  Better in AM worse at end of day.  No shortness of breath or chest pain or orthopnea.  Has history of anemia  Review of Symptoms - see HPI  PMH - Smoking status noted.     Review of Systems     Objective:   Physical Exam  Heart - Regular rate and rhythm.  No murmurs, gallops or rubs.    Lungs:  Normal respiratory effort, chest expands symmetrically. Lungs are clear to auscultation, no crackles or wheezes. Extrem - 1 + edema at ankle is wearing compression stockings Back - decreased range of motion forward and back bend due to pain.  Diffuse paraspinal lumbar area tenderness.  No deformity or focal tenderness No SLR pain.  Able to stand on tip toes and heels       Assessment & Plan:

## 2011-12-02 NOTE — Patient Instructions (Addendum)
Use ibuprofen 3-4 tabs with food every 8 hours  Use the flexaril as needed usually before sleep  Continue the alternating ice and heat  If not better in 3-4 weeks or if you lose bowel or bladder or have weakness in one leg then call us immediately   I will call you if your tests are not good.  Otherwise I will send you a letter.  If you do not hear from me with in 2 weeks please call our office.     If the swelling becomes much worse or if you have shortness of breath come back

## 2011-12-02 NOTE — Assessment & Plan Note (Addendum)
New. Mild and seems to be controlled with compression.  Maybe due to nsaid use and weight.  No signs of chf or dvt.   Given history of anemia will check cbc

## 2011-12-27 ENCOUNTER — Other Ambulatory Visit: Payer: Self-pay | Admitting: Family Medicine

## 2012-01-13 ENCOUNTER — Other Ambulatory Visit: Payer: Self-pay | Admitting: Family Medicine

## 2012-02-11 ENCOUNTER — Ambulatory Visit (INDEPENDENT_AMBULATORY_CARE_PROVIDER_SITE_OTHER): Payer: 59 | Admitting: Family Medicine

## 2012-02-11 ENCOUNTER — Other Ambulatory Visit (HOSPITAL_COMMUNITY)
Admission: RE | Admit: 2012-02-11 | Discharge: 2012-02-11 | Disposition: A | Discharge status: Home / Self Care | Payer: 59 | Source: Ambulatory Visit | Attending: Family Medicine | Admitting: Family Medicine

## 2012-02-11 DIAGNOSIS — N760 Acute vaginitis: Secondary | ICD-10-CM

## 2012-02-11 DIAGNOSIS — A499 Bacterial infection, unspecified: Secondary | ICD-10-CM

## 2012-02-11 DIAGNOSIS — I10 Essential (primary) hypertension: Secondary | ICD-10-CM

## 2012-02-11 DIAGNOSIS — N898 Other specified noninflammatory disorders of vagina: Secondary | ICD-10-CM

## 2012-02-11 DIAGNOSIS — Z113 Encounter for screening for infections with a predominantly sexual mode of transmission: Secondary | ICD-10-CM

## 2012-02-11 DIAGNOSIS — A6000 Herpesviral infection of urogenital system, unspecified: Secondary | ICD-10-CM

## 2012-02-11 DIAGNOSIS — B9689 Other specified bacterial agents as the cause of diseases classified elsewhere: Secondary | ICD-10-CM

## 2012-02-11 DIAGNOSIS — R300 Dysuria: Secondary | ICD-10-CM

## 2012-02-11 LAB — POCT WET PREP (WET MOUNT)

## 2012-02-11 MED ORDER — VALACYCLOVIR HCL 500 MG PO TABS: 500.0000 mg | ORAL_TABLET | Freq: Every day | ORAL | Status: DC

## 2012-02-11 MED ORDER — LISINOPRIL-HYDROCHLOROTHIAZIDE 10-12.5 MG PO TABS: 1.0000 | ORAL_TABLET | Freq: Every day | ORAL | Status: DC

## 2012-02-11 NOTE — Progress Notes (Signed)
  Subjective:    Patient ID: Kelsey Zavala, female    DOB: 1975/09/16, 37 y.o.   MRN: [id number]  Vaginal Discharge The patient's primary symptoms include a genital odor and a vaginal discharge. This is a new problem. The current episode started in the past 7 days. Associated symptoms include abdominal pain. Pertinent negatives include no chills, dysuria or fever. The vaginal discharge was malodorous, yellow and dark. There has been no bleeding. She is sexually active. No, her partner does not have an STD. Her menstrual history has been regular.  Worried her partner has been unfaithful.  Has h/o trich many years ago and is worried it is the same thing.    Review of Systems  Constitutional: Negative for fever and chills.  Gastrointestinal: Positive for abdominal pain.  Genitourinary: Positive for vaginal discharge. Negative for dysuria and menstrual problem.       Objective:   Physical Exam  Vitals reviewed. Constitutional: She appears well-developed and well-nourished.  Genitourinary: Uterus normal. There is no rash or tenderness on the right labia. There is no rash or tenderness on the left labia. Cervix exhibits no motion tenderness. Right adnexum displays no mass and no tenderness. Left adnexum displays no mass and no tenderness. Vaginal discharge (yellow) found.          Assessment & Plan:

## 2012-02-11 NOTE — Patient Instructions (Signed)
Vaginitis  Vaginitis is an infection. It causes soreness, swelling, and redness (inflammation) of the vagina. Many of these infections are sexually transmitted diseases (STDs). Having unprotected sex can cause further problems and complications such as:   Chronic pelvic pain.   Infertility.   Unwanted pregnancy.   Abortion.   Tubal pregnancy.   Infection passed on to the newborn.   Cancer.  CAUSES    Monilia. This is a yeast or fungus infection, not an STD.   Bacterial vaginosis. The normal balance of bacteria in the vagina is disrupted and is replaced by an overgrowth of certain bacteria.   Gonorrhea, chlamydia. These are bacterial infections that are STDs.   Vaginal sponges, diaphragms, and intrauterine devices.   Trichomoniasis. This is a STD infection caused by a parasite.   Viruses like herpes and human papillomavirus. Both are STDs.   Pregnancy.   Immunosuppression. This occurs with certain conditions such as HIV infection or cancer.   Using bubble bath.   Taking certain antibiotic medicines.   Sporadic recurrence can occur if you become sick.   Diabetes.   Steroids.   Allergic reaction. If you have an allergy to:   Douches.   Soaps.   Spermicides.   Condoms.   Scented tampons or vaginal sprays.  SYMPTOMS    Abnormal vaginal discharge.   Itching of the vagina.   Pain in the vagina.   Swelling of the vagina.  In some cases, there are no symptoms.  TREATMENT   Treatment will vary depending on the type of infection.   Bacteria or trichomonas are usually treated with oral antibiotics and sometimes vaginal cream or suppositories.   Monilia vaginitis is usually treated with vaginal creams, suppositories, or oral antifungal pills.   Viral vaginitis has no cure. However, the symptoms of herpes (a viral vaginitis) can be treated to relieve the discomfort. Human papillomavirus has no symptoms. However, there are treatments for the diseases caused by human papillomavirus.   With allergic  vaginitis, you need to stop using the product that is causing the problem. Vaginal creams can be used to treat the symptoms.   When treating an STD, the sex partner should also be treated.  HOME CARE INSTRUCTIONS    Take all the medicines as directed by your caregiver.   Do not use scented tampons, soaps, or vaginal sprays.   Do not douche.   Tell your sex partner if you have a vaginal infection or an STD.   Do not have sexual intercourse until you have treated the vaginitis.   Practice safe sex by using condoms.  SEEK MEDICAL CARE IF:    You have abdominal pain.   Your symptoms get worse during treatment.  Document Released: 10/19/2006 Document Revised: 03/16/2011 Document Reviewed: 06/14/2008  ExitCare Patient Information 2013 [employer].

## 2012-02-12 ENCOUNTER — Telehealth: Payer: Self-pay | Admitting: Family Medicine

## 2012-02-12 ENCOUNTER — Encounter: Payer: Self-pay | Admitting: Family Medicine

## 2012-02-12 DIAGNOSIS — B9689 Other specified bacterial agents as the cause of diseases classified elsewhere: Secondary | ICD-10-CM | POA: Insufficient documentation

## 2012-02-12 DIAGNOSIS — N760 Acute vaginitis: Secondary | ICD-10-CM | POA: Insufficient documentation

## 2012-02-12 LAB — HIV ANTIBODY (ROUTINE TESTING W REFLEX): HIV: NONREACTIVE

## 2012-02-12 LAB — HEPATITIS C ANTIBODY: HCV Ab: NEGATIVE

## 2012-02-12 MED ORDER — METRONIDAZOLE 500 MG PO TABS: 500.0000 mg | ORAL_TABLET | Freq: Two times a day (BID) | ORAL | Status: AC

## 2012-02-12 NOTE — Assessment & Plan Note (Signed)
Will await actual results.  Full STD screening done.

## 2012-02-12 NOTE — Telephone Encounter (Signed)
LVM that wet prep showed BV and blood work was negative.  Flagyl sent to pharmacy.

## 2012-02-26 IMAGING — CR DG FINGER MIDDLE 2+V*L*
3 series · 3 of 3 positions shown · non-contrast
Comparison: None.

CLINICAL DATA: Injury from fall.  Pain in the PIP joint area of the
middle finger of the left hand.

LEFT MIDDLE FINGER 2+V

[x finger pa left]
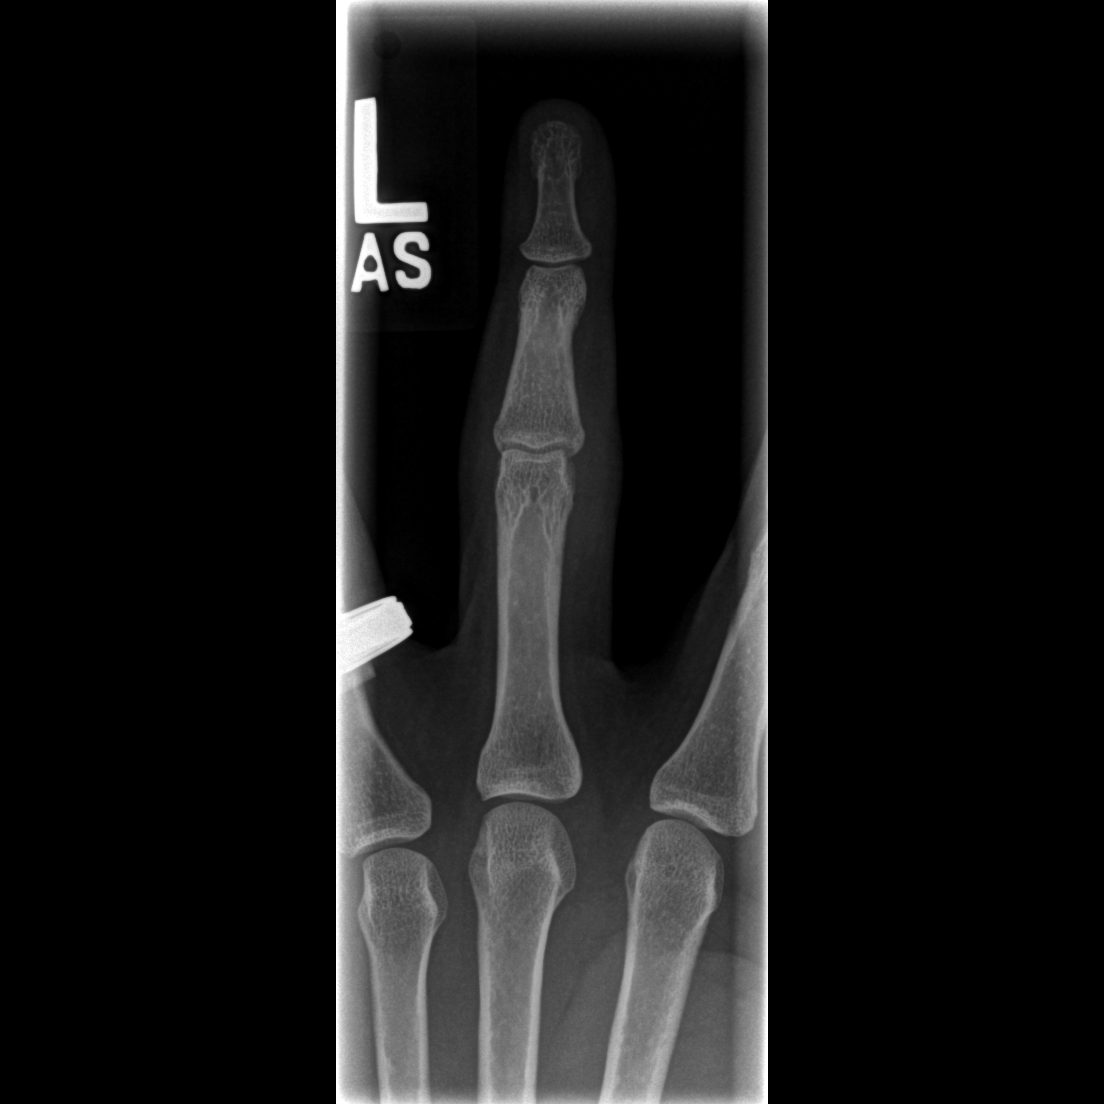

[x finger obl. left]
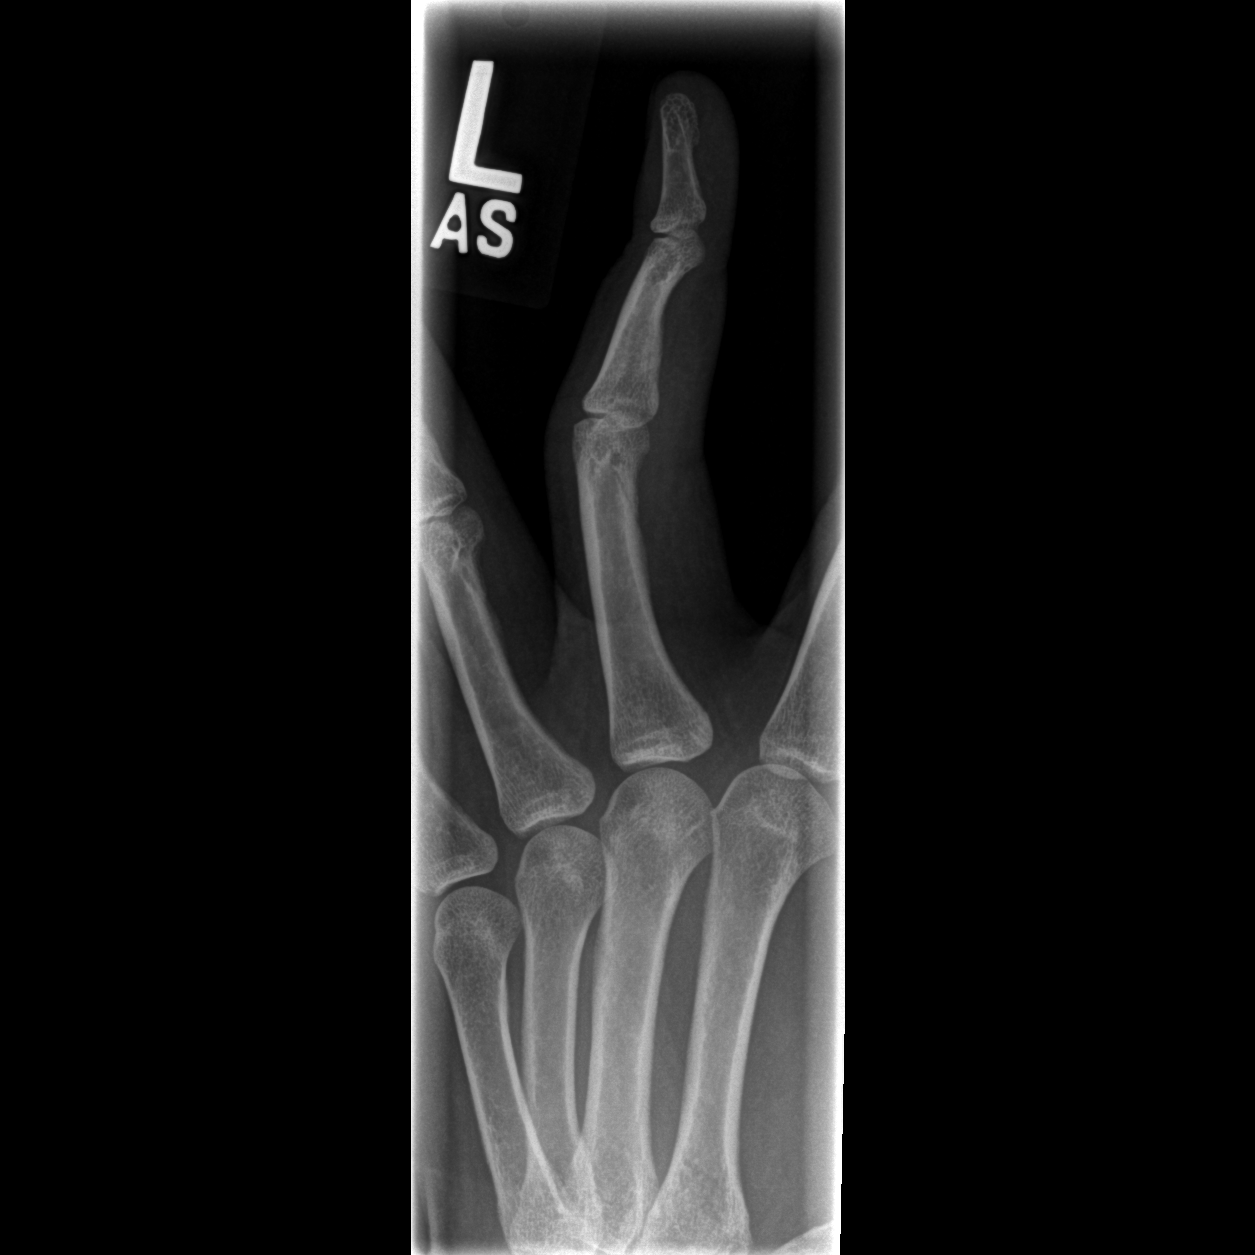

[x finger lateral left]
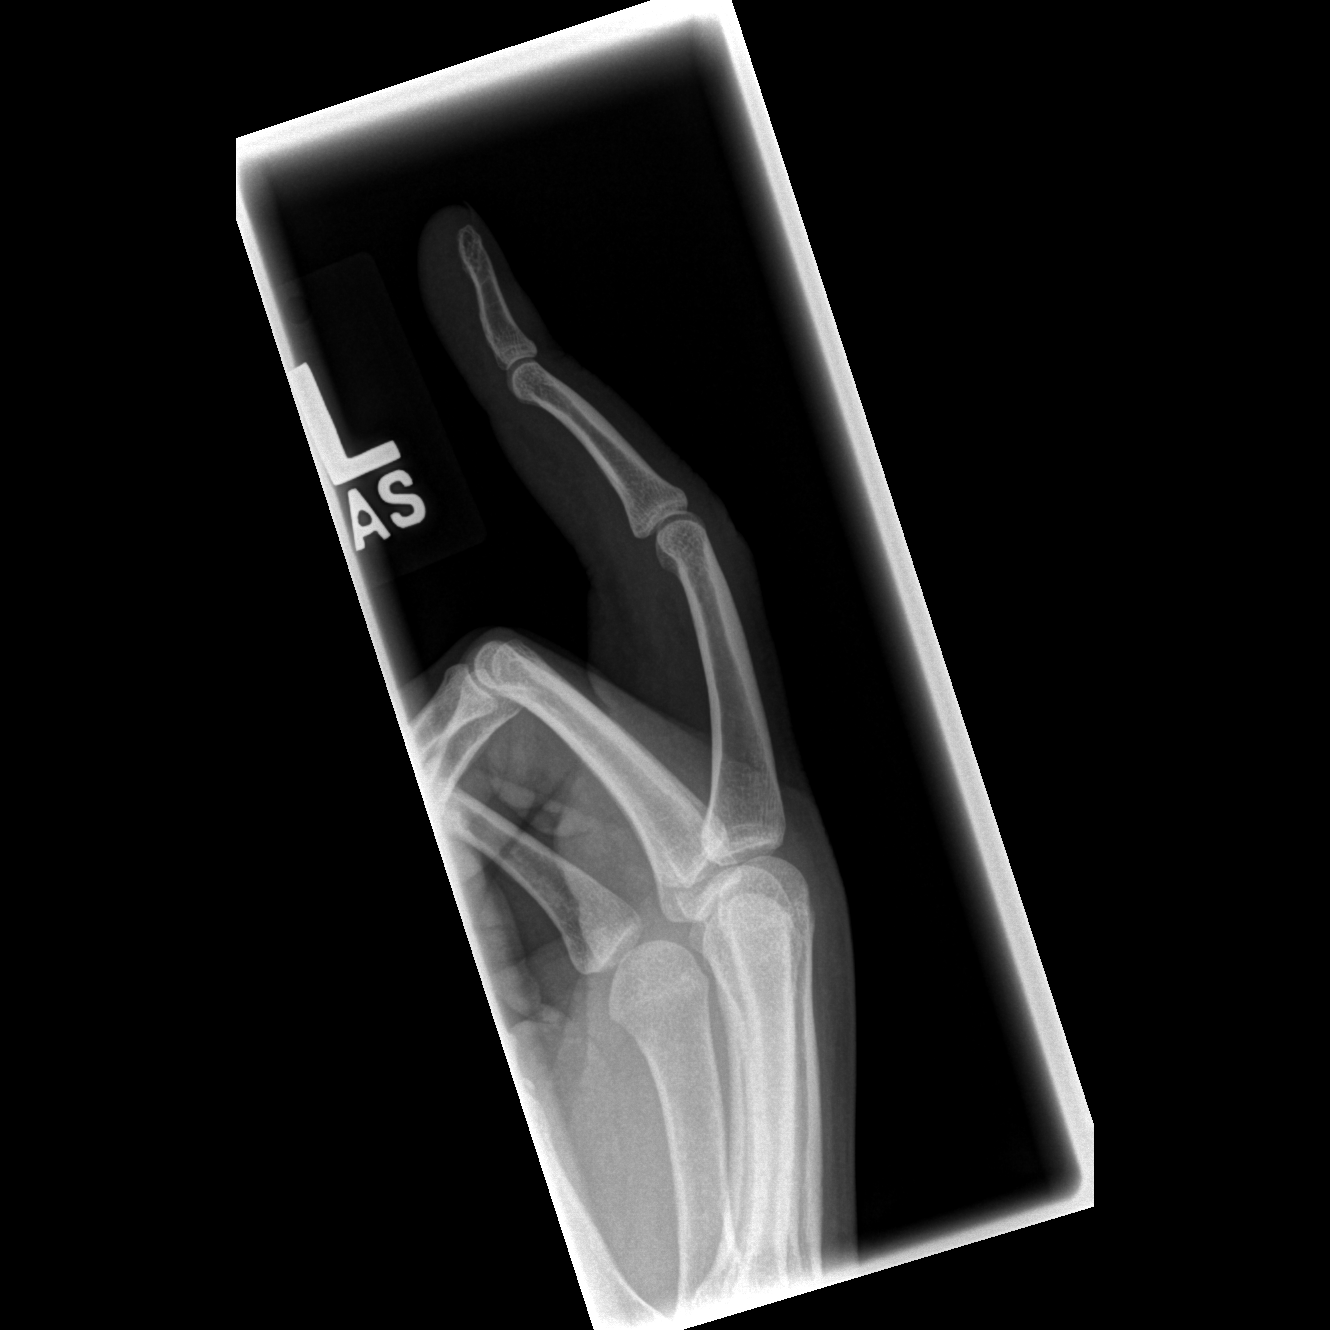

[3 of 3 positions shown; findings below may reference images not displayed]

FINDINGS: Alignment is normal.  Joint spaces are preserved.  No
fracture or dislocation is evident.  No soft tissue lesions are
seen.
IMPRESSION: No fracture or dislocation.

## 2012-05-16 ENCOUNTER — Encounter: Payer: Self-pay | Admitting: Family Medicine

## 2012-05-16 ENCOUNTER — Ambulatory Visit (INDEPENDENT_AMBULATORY_CARE_PROVIDER_SITE_OTHER): Payer: 59 | Admitting: Family Medicine

## 2012-05-16 VITALS — BP 135/90 | HR 77 | Ht 68.5 in | Wt 286.4 lb

## 2012-05-16 DIAGNOSIS — R110 Nausea: Secondary | ICD-10-CM

## 2012-05-16 DIAGNOSIS — R42 Dizziness and giddiness: Secondary | ICD-10-CM

## 2012-05-16 LAB — CBC
MCH: 29.8 pg (ref 26.0–34.0)
MCV: 88.7 fL (ref 78.0–100.0)
Platelets: 285 10*3/uL (ref 150–400)
RBC: 3.82 MIL/uL — ABNORMAL LOW (ref 3.87–5.11)

## 2012-05-16 LAB — COMPREHENSIVE METABOLIC PANEL
ALT: 18 U/L (ref 0–35)
CO2: 26 mEq/L (ref 19–32)
Creat: 0.76 mg/dL (ref 0.50–1.10)
Total Bilirubin: 0.4 mg/dL (ref 0.3–1.2)

## 2012-05-16 LAB — TSH: TSH: 0.99 u[IU]/mL (ref 0.350–4.500)

## 2012-05-16 NOTE — Assessment & Plan Note (Signed)
Orthostatic vitals were normal today.  BP mildly elevated, but less likely the cause of dizziness.  Unable to perform full Dix-hallpike maneuver due to nausea.  Patient became very sad and tearful during exam.  Will check routine labs, CBC, CMET, and TSH and offered referral to ENT since she has a hx of vertigo.  Neuro exam was normal.  No other red flags on exam.  Patient to follow up with PCP after she sees ENT or sooner as needed.

## 2012-05-16 NOTE — Progress Notes (Signed)
  Subjective:    Patient ID: Kelsey Zavala, female    DOB: 1975/04/11, 37 y.o.   MRN: [id number]  HPI  Patient presents for same day appointment for dizziness.  Symptoms started 4 days ago.  She works as a Clinical biochemist and is concerned because when she bends down, she develops dizziness and "swimmy head" feeling.  It also occurs at bedtime while sitting in bed watching TV.  Describes dizziness as "I just can't get it together" and "I feel out of whack for a few seconds."  This lasts for a few seconds and then symptoms resolved.    She has never had these symptoms before, but later tells me she has been told she has vertigo.  She denies any previous URI symptoms.  She does endorse nausea without vomiting.  Denies any ringing of ears.  She had one episode when she felt the room was spinning.  Denies any LOC, CP, or SOB.  Patient had a tubal ligation in 2005.  She has hx HTN, Depression (off Zoloft for several years).  She has been on Lisinopril-HCTZ for several years.  Review of Systems Per HPI    Objective:   Physical Exam  Constitutional: She appears well-nourished. No distress.  HENT:  Head: Normocephalic and atraumatic.  Pulmonary/Chest: Effort normal.  Neurological: She is alert. No cranial nerve deficit. Coordination normal.  Attempted Dix-hall pike maneuver, but patient became very nauseated while laying supine, so I was not able to perform this for 30 sec on each side; no nystagmus elicited      Assessment & Plan:

## 2012-05-16 NOTE — Patient Instructions (Addendum)
Because your symptoms of dizziness are getting worse and we will refer you to a specialist. Your blood pressure is a little high, but did not change with position. We will also check blood work and call you with any abnormal results. Schedule follow up appointment with your PCP after you get in to see a specialist. Hope you feel better soon.

## 2012-05-17 ENCOUNTER — Encounter: Payer: Self-pay | Admitting: Family Medicine

## 2012-05-18 ENCOUNTER — Telehealth: Payer: Self-pay | Admitting: Family Medicine

## 2012-05-18 NOTE — Telephone Encounter (Signed)
Hi Huntley Dec, could you please let Trenese Haft know that lab work showed mild anemia, but otherwise normal.  She is to follow up with ENT for further evaluation.  Thanks.

## 2012-05-18 NOTE — Telephone Encounter (Signed)
LVM for patient to call back. ?

## 2012-07-06 ENCOUNTER — Other Ambulatory Visit: Payer: Self-pay | Admitting: Family Medicine

## 2012-08-03 ENCOUNTER — Encounter: Payer: 59 | Admitting: Family Medicine

## 2012-08-19 ENCOUNTER — Ambulatory Visit (INDEPENDENT_AMBULATORY_CARE_PROVIDER_SITE_OTHER): Payer: 59 | Admitting: Family Medicine

## 2012-08-19 VITALS — BP 161/96 | HR 73 | Ht 68.0 in | Wt 293.0 lb

## 2012-08-19 DIAGNOSIS — N898 Other specified noninflammatory disorders of vagina: Secondary | ICD-10-CM

## 2012-08-19 LAB — POCT WET PREP (WET MOUNT)

## 2012-08-19 MED ORDER — METRONIDAZOLE 500 MG PO TABS: 500.0000 mg | ORAL_TABLET | Freq: Two times a day (BID) | ORAL | Status: DC

## 2012-08-19 NOTE — Patient Instructions (Addendum)
Bacterial Vaginosis  Bacterial vaginosis is an infection of the vagina. A healthy vagina has many kinds of good germs (bacteria). Sometimes the number of good germs can change. This allows bad germs to move in and cause an infection. You may be given medicine (antibiotics) to treat the infection. Or, you may not need treatment at all.  HOME CARE   Take your medicine as told. Finish them even if you start to feel better.   Do not have sex until you finish your medicine.   Do not douche.   Practice safe sex.   Tell your sex partner that you have an infection. They should see their doctor for treatment if they have problems.  GET HELP RIGHT AWAY IF:   You do not get better after 3 days of treatment.   You have grey fluid (discharge) coming from your vagina.   You have pain.   You have a temperature of 102 F (38.9 C) or higher.  MAKE SURE YOU:    Understand these instructions.   Will watch your condition.   Will get help right away if you are not doing well or get worse.  Document Released: 10/01/2007 Document Revised: 03/16/2011 Document Reviewed: 10/01/2007  ExitCare Patient Information 2014 [employer].

## 2012-08-19 NOTE — Progress Notes (Signed)
Patient ID: Kelsey Zavala, female   DOB: 12-13-1975, 37 y.o.   MRN: [id number]  [facility] Amber M. Hairford, MD Phone: [phone]   Subjective: HPI: Patient is a 37 y.o. female presenting to clinic today for same day appointment for vaginal discharge.  Vaginitis Patient presents for evaluation of an abnormal vaginal discharge. Symptoms have been present for 4 days. Vaginal symptoms: discharge described as white and milky and odor. Contraception: tubal ligation. She denies local irritation and pain. Sexually transmitted infection risk: possible STD exposure but recently checked. Menstrual flow: regular, off menses one week ago.  History Reviewed: Non-smoker.  ROS: Please see HPI above.  Objective: Office vital signs reviewed. BP 161/96  Pulse 73  Ht 5\' 8"  (1.727 m)  Wt 293 lb (132.904 kg)  BMI 44.56 kg/m2  Physical Examination:  General: Awake, alert. NAD HEENT: Atraumatic, normocephalic. MMM Pulm: CTAB, no wheezes Cardio: RRR, no murmurs appreciated Abdomen: soft, nondistended. Mild TTP on bimanual in LLQ GU: External wnl. Vagina with thin white malodorous discharge. No CMT  Assessment: 37 y.o. female with vaginitis  Plan: See Problem List and After Visit Summary

## 2012-08-19 NOTE — Assessment & Plan Note (Signed)
Most consistent with BV. Will treat with Flagyl PO x7 days. If persistent or recurrent symptoms, consider prophylactic management.

## 2012-09-09 ENCOUNTER — Encounter: Payer: 59 | Admitting: Family Medicine

## 2012-09-16 IMAGING — CR DG CHEST 1V PORT
1 series · 1 of 1 positions shown · non-contrast
Comparison: Chest x-ray of 11/20/2009

CLINICAL DATA: Right chest and arm pain

PORTABLE CHEST - 1 VIEW

[AP]
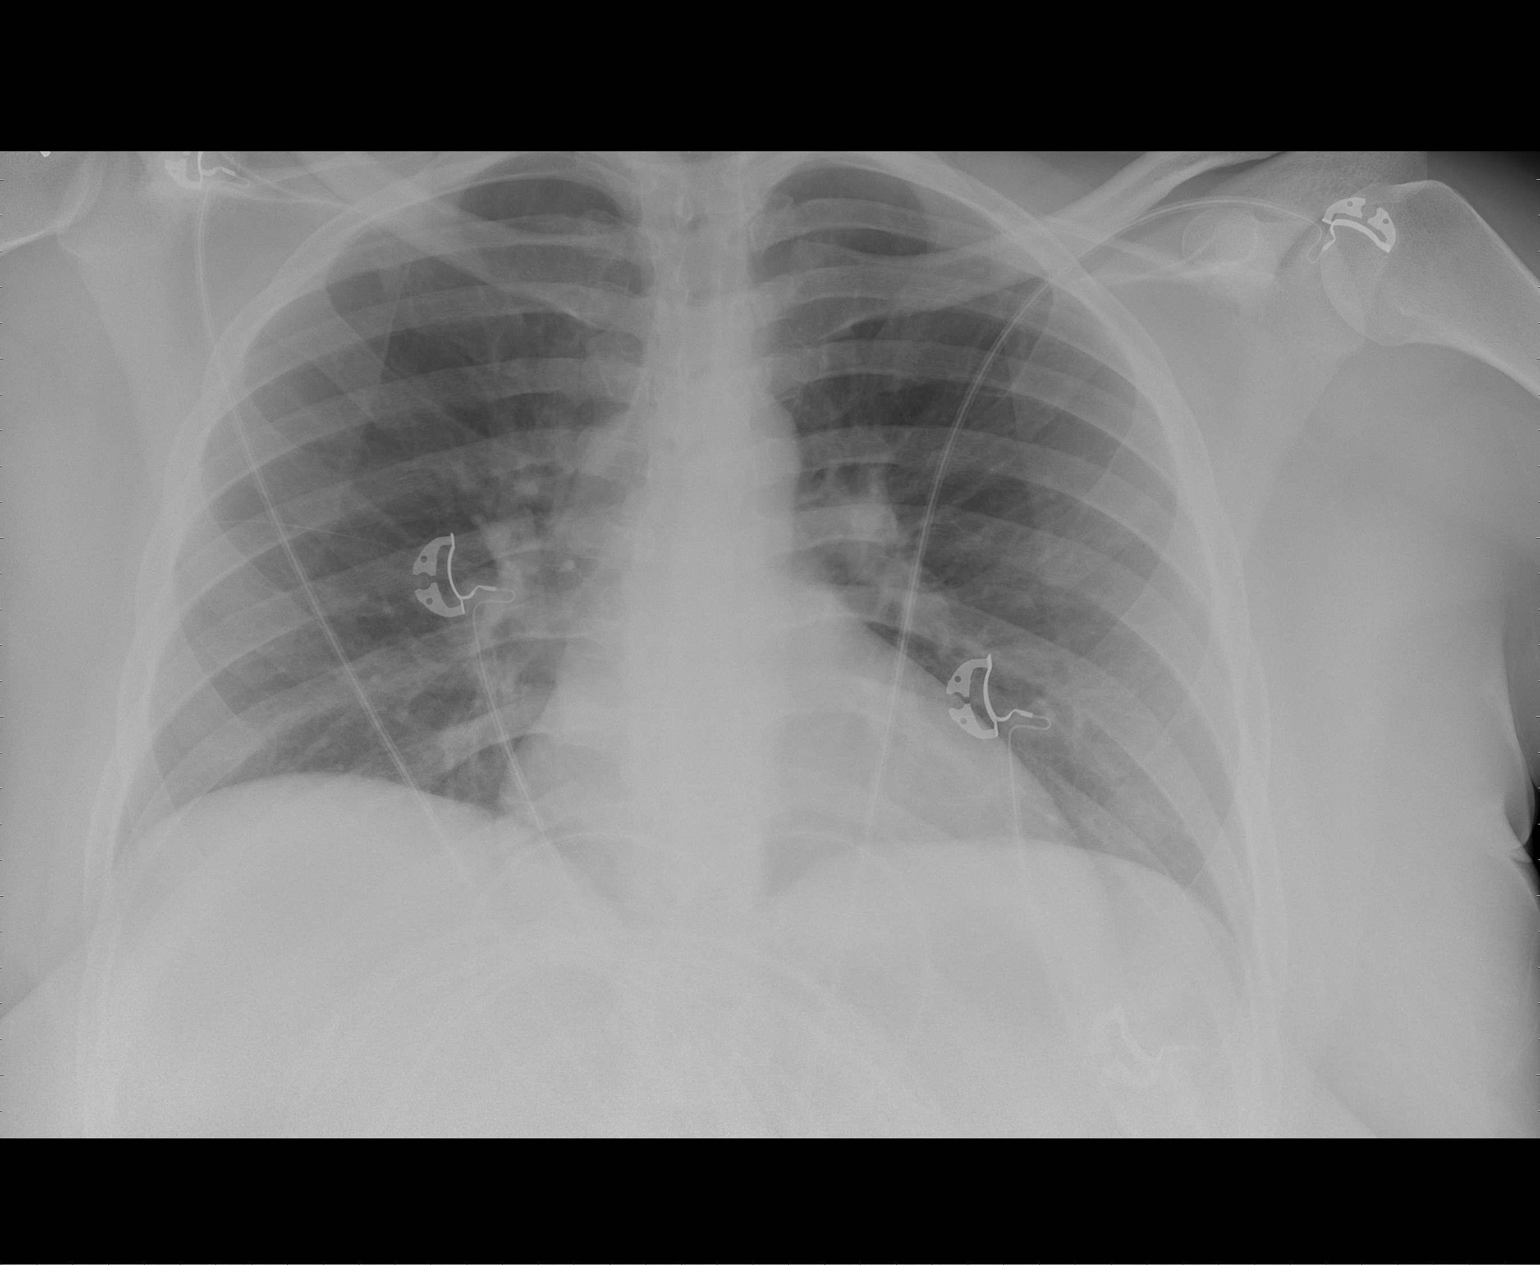

[1 of 1 positions shown; findings below may reference images not displayed]

FINDINGS: The lungs are clear.  Mediastinal contours appear normal.
The heart is within upper limits of normal.  No bony abnormality is
seen.
IMPRESSION: Stable chest x-ray.  No active lung disease.

## 2012-11-10 ENCOUNTER — Other Ambulatory Visit: Payer: Self-pay | Admitting: Family Medicine

## 2012-11-11 ENCOUNTER — Encounter: Payer: Self-pay | Admitting: Family Medicine

## 2012-11-11 ENCOUNTER — Ambulatory Visit (INDEPENDENT_AMBULATORY_CARE_PROVIDER_SITE_OTHER): Payer: 59 | Admitting: Family Medicine

## 2012-11-11 VITALS — BP 136/83 | HR 68 | Ht 68.0 in | Wt 296.0 lb

## 2012-11-11 DIAGNOSIS — L709 Acne, unspecified: Secondary | ICD-10-CM

## 2012-11-11 DIAGNOSIS — L708 Other acne: Secondary | ICD-10-CM

## 2012-11-11 DIAGNOSIS — R300 Dysuria: Secondary | ICD-10-CM | POA: Insufficient documentation

## 2012-11-11 LAB — POCT UA - MICROSCOPIC ONLY

## 2012-11-11 LAB — POCT URINALYSIS DIPSTICK
Bilirubin, UA: NEGATIVE
Blood, UA: NEGATIVE
Glucose, UA: NEGATIVE
Ketones, UA: NEGATIVE
Spec Grav, UA: 1.025
Urobilinogen, UA: 1

## 2012-11-11 MED ORDER — CLINDAMYCIN PHOS-BENZOYL PEROX 1-5 % EX GEL: Freq: Two times a day (BID) | CUTANEOUS | Status: DC

## 2012-11-11 MED ORDER — SULFAMETHOXAZOLE-TMP DS 800-160 MG PO TABS: 1.0000 | ORAL_TABLET | Freq: Two times a day (BID) | ORAL | Status: DC

## 2012-11-11 MED ORDER — CLINDAMYCIN PHOSPHATE 1 % EX GEL: Freq: Two times a day (BID) | CUTANEOUS | Status: DC

## 2012-11-11 NOTE — Patient Instructions (Signed)
You have a urinary tract infection. Please empty your bladder frequently and drink lots of water. In addition, I sent in an antibiotic that you will take twice daily for three days. Do not drink alcohol with this as it will make you very nauseated.   For your acne I sent in an antibiotic gel with and without benzoyl peroxide and you can pick up whichever is affordable. In my experience people report that the one with benzoyl peroxide in it works a little better.  Thank you,  Dr. Richarda Blade

## 2012-11-11 NOTE — Assessment & Plan Note (Addendum)
Dysuria x1 day, suprapubic tenderness, afebrile, no CVA tenderness - UA with 30 protein only - given symptoms will treat empirically with bactrim pending urine culture - if calls for persistent symptoms ok to call in pyridium  - recommended frequent bladder emptying and hydration

## 2012-11-11 NOTE — Addendum Note (Signed)
Addended by: [place], Zakry Caso on: 11/11/2012 05:39 PM   Modules accepted: Orders

## 2012-11-11 NOTE — Progress Notes (Signed)
  Subjective:    Patient ID: Kelsey Zavala, female    DOB: December 12, 1975, 37 y.o.   MRN: [id number]  HPI 37yo female presents for SDA for lower abdominal discomfort after urinating. 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 She reports that she has had a UTI in the past and it felt similar to this. Her symptoms started this morning and have continued throughout the day.    Review of Systems  Gastrointestinal: Positive for abdominal pain. Negative for nausea and vomiting.  Genitourinary: Negative for dysuria, urgency, hematuria, flank pain, decreased urine volume, vaginal bleeding, vaginal discharge, difficulty urinating and vaginal pain.  Musculoskeletal: Negative for back pain.  All other systems reviewed and are negative.       Objective:   Physical Exam  Nursing note and vitals reviewed. Constitutional: She is oriented to person, place, and time. She appears well-developed and well-nourished. No distress.  HENT:  Head: Normocephalic and atraumatic.  Right Ear: External ear normal.  Left Ear: External ear normal.  Nose: Nose normal.  Eyes: Conjunctivae are normal. Right eye exhibits no discharge. Left eye exhibits no discharge. No scleral icterus.  Neck: Normal range of motion.  Abdominal: Soft. She exhibits no distension and no mass. There is tenderness. There is no rebound and no guarding.  Suprapubic tenderness only  Neurological: She is alert and oriented to person, place, and time.  Skin: Skin is warm and dry. She is not diaphoretic.  Psychiatric: She has a normal mood and affect.          Assessment & Plan:

## 2012-11-11 NOTE — Assessment & Plan Note (Signed)
Reports that benzoyl peroxide makes things worse and adapalene too expensive - will try topical clinda

## 2012-11-13 LAB — URINE CULTURE: Colony Count: >=100000

## 2012-11-17 ENCOUNTER — Ambulatory Visit (INDEPENDENT_AMBULATORY_CARE_PROVIDER_SITE_OTHER): Payer: 59 | Admitting: Family Medicine

## 2012-11-17 ENCOUNTER — Encounter: Payer: Self-pay | Admitting: Family Medicine

## 2012-11-17 VITALS — BP 139/76 | HR 83 | Temp 98.7°F | Ht 68.0 in | Wt 290.5 lb

## 2012-11-17 DIAGNOSIS — E669 Obesity, unspecified: Secondary | ICD-10-CM

## 2012-11-17 DIAGNOSIS — L708 Other acne: Secondary | ICD-10-CM

## 2012-11-17 DIAGNOSIS — Z0000 Encounter for general adult medical examination without abnormal findings: Secondary | ICD-10-CM

## 2012-11-17 DIAGNOSIS — L709 Acne, unspecified: Secondary | ICD-10-CM

## 2012-11-17 LAB — LIPID PANEL
LDL Cholesterol: 127 mg/dL — ABNORMAL HIGH (ref 0–99)
Triglycerides: 65 mg/dL (ref <150)
VLDL: 13 mg/dL (ref 0–40)

## 2012-11-17 NOTE — Addendum Note (Signed)
Addended by: Farrell Ours on: 11/17/2012 03:25 PM   Modules accepted: Orders

## 2012-11-17 NOTE — Progress Notes (Signed)
Kelsey Zavala is a 37 y.o. female who presents today for acne and concerns about diet  Acne - Pt reports that she has had increased blackheads around her perioral region over the last several months now.  Has tried benzoyl peroxide with moderate success but dries out her skin.  Has tried both clindamycin and clinda/benzyl peroxide that makes "her face swell".  She denies any recent changes in any other medications, skin lotions, soaps.   Obesity - Primary concerns today: Weight management and Annual Review and binge eating.   Usual eating pattern includes 3 meals and various snacks per day. Usual physical activity includes None .     Current Outpatient Prescriptions on File Prior to Visit  Medication Sig Dispense Refill  . clindamycin (CLINDAGEL) 1 % gel Apply topically 2 (two) times daily.  30 g  0  . clindamycin-benzoyl peroxide (BENZACLIN) gel Apply topically 2 (two) times daily.  25 g  0  . cyclobenzaprine (FLEXERIL) 10 MG tablet Take 1 tablet (10 mg total) by mouth 2 (two) times daily as needed for muscle spasms.  30 tablet  1  . fluconazole (DIFLUCAN) 150 MG tablet       . ibuprofen (ADVIL,MOTRIN) 200 MG tablet Take 400-800 mg by mouth every 6 (six) hours as needed. Takes 2 to 4 tablets for pain       . lisinopril-hydrochlorothiazide (PRINZIDE,ZESTORETIC) 10-12.5 MG per tablet TAKE ONE TABLET BY MOUTH ONE TIME DAILY  30 tablet  2  . metroNIDAZOLE (FLAGYL) 500 MG tablet Take 1 tablet (500 mg total) by mouth 2 (two) times daily.  14 tablet  0  . Multiple Vitamins-Minerals (MULTIVITAMINS THER. W/MINERALS) TABS Take 1 tablet by mouth daily.        . sertraline (ZOLOFT) 50 MG tablet Take 1 tablet (50 mg total) by mouth daily.  30 tablet  1  . sulfamethoxazole-trimethoprim (BACTRIM DS) 800-160 MG per tablet Take 1 tablet by mouth 2 (two) times daily.  6 tablet  0  . valACYclovir (VALTREX) 500 MG tablet Take one tablet by mouth one time daily  30 tablet  2  . [DISCONTINUED] citalopram (CELEXA)  20 MG tablet Take 1 tablet (20 mg total) by mouth daily.  30 tablet  3   No current facility-administered medications on file prior to visit.    ROS: Per HPI.  All other systems reviewed and are negative.   Physical Exam Filed Vitals:   11/17/12 1439  BP: 139/76  Pulse: 83  Temp: 98.7 F (37.1 C)    Physical Examination: Gen:NAD, obese female HEENT: Scattered blackheads, various stages

## 2012-11-17 NOTE — Patient Instructions (Signed)
Ms. Kutsch, it was nice seeing you today.  Please try the benzoyl peroxide again as tolerated with eucerin cream.  As well, please make an appointment with Dr. Gerilyn Pilgrim.  Thanks, Dr. Paulina Fusi   Benzoyl Peroxide skin cream, gel or lotion How should I use this medicine? This medicine is for external use only. Do not take by mouth. Follow the directions on the prescription label. Before using, wash affected area with a gentle cleanser and pat dry. Do not apply to raw or irritated skin. Apply enough medicine to cover the area and rub in gently. Avoid getting medicine in your eyes, lips, nose, mouth, or other sensitive areas. Do not wash treated areas of skin for at least 1 hour after using the medicine. If you experience very dry and peeling skin or skin irritation, talk to your doctor or health care professional.  What should I watch for while using this medicine? Your acne may get worse during the first few weeks of treatment, and then start to get better. It may take 8 to 12 weeks before you see the full effect. If you do not see any improvement within 4 to 6 weeks, call your doctor or health care professional. Once you see a decrease in your acne, you may need to continue to use this medicine to control it. Do not use products that may dry the skin like medicated cosmetics, products that contain alcohol, or abrasive soaps or cleaners. Do not use other acne or skin treatment on the same area that you use this medicine unless your doctor or health care professional tells you to. If you use these together they can cause severe skin irritation. This medicine can make you more sensitive to the sun. Keep out of the sun. If you cannot avoid being in the sun, wear protective clothing and use sunscreen. Do not use sun lamps or tanning beds/booths. This medicine may bleach hair or colored fabrics. Avoid getting the medicine on your clothes. What side effects may I notice from receiving this medicine? Side effects that  you should report to your doctor or health care professional as soon as possible: -allergic reactions like skin rash, itching or hives, swelling of the face, lips, or tongue -severe burning, itching, reddening, crusting, or swelling of the treated areas Side effects that usually do not require medical attention (report to your doctor or health care professional if they continue or are bothersome): -increased sensitivity to the sun -mild burning or stinging of the treated areas -red, inflamed, and irritated skin This list may not describe all possible side effects. Call your doctor for medical advice about side effects. You may report side effects to FDA at 1-800-FDA-1088.  Make three lists of vegetables: (1) those you like and eat now; (2) vegetables you won't even consider; and (3) vegetables you might consider trying if they are prepared a certain way.  Continue to eat veg's you currently eat, but from this last list, choose a vegetable to try at least 3 times a week.  Use small amounts of this vegetable, cut small, combined with foods or seasonings you like.    Try to eat your fruit, not drink it.  Fruit Juice and Fruit drinks are very high in added sugar and preservatives.  Naturally occuring fruits have a good amount of fiber and sugar.  This mix allows for a more steady release of sugar from your stomach to be used by your body.  This will prevent you from "crashing" and feeling tired  after a sugary meal.    MOST IMPORTANTLY, THIS IS A JOURNEY!  It is ok to have a day here or there where you don't follow a diet to the exact guidelines.  THIS IS OK!  The goal of this journey is to learn to balance the amount of foods you are putting into your body, starting to exercise (WHICH WILL RELEASE NATURAL "FEEL GOOD HORMONES" TO MAKE YOU HAPPIER).  A day here or there, or a meal here or there that isn't within your diet guidelines is all right.  Accept it, move on, and look forward to the next day.

## 2012-11-17 NOTE — Assessment & Plan Note (Addendum)
Pt extremely upset about her weight and binge eating.  I gave her a few things to work on including portion control, eating more colorful things, etc.  However, I think she would benefit from evaluation with Dr. Gerilyn Pilgrim.  Card given, and instructed to set an appointment.

## 2012-11-17 NOTE — Assessment & Plan Note (Signed)
Pt reports that she has had increased blackheads around her perioral region over the last several months now.  Has tried benzoyl peroxide with moderate success but dries out her skin.  Has tried both clindamycin and clinda/benzyl peroxide that makes "her face swell".  She denies any recent changes in any other medications, skin lotions, soaps.  She got the most benefit from the benzoyl peroxide when using qod, will recommend this along with eucerin cream as she had her skin dry out.  She is most concerned about her perioral region, recommend nightly application to this region with daily eucerin cream.  F/U in 1-2 months if no improvement.

## 2012-11-17 NOTE — Addendum Note (Signed)
Addended by: Farrell Ours on: 11/17/2012 03:23 PM   Modules accepted: Orders

## 2012-11-18 ENCOUNTER — Encounter: Payer: Self-pay | Admitting: Family Medicine

## 2012-11-21 LAB — NICOTINE SCREEN, URINE
Anabasine, urine: NEGATIVE ng/mL
Cotinine, Urine: NEGATIVE ng/mL

## 2012-11-25 ENCOUNTER — Telehealth: Payer: Self-pay | Admitting: *Deleted

## 2012-11-25 NOTE — Telephone Encounter (Signed)
Patient came in office with her children asking if paperwork she left had been faxed,  paperwork/labs faxed to number provided, patient aware.

## 2012-12-31 ENCOUNTER — Other Ambulatory Visit: Payer: Self-pay | Admitting: Family Medicine

## 2013-04-07 ENCOUNTER — Other Ambulatory Visit: Payer: Self-pay | Admitting: Family Medicine

## 2013-04-13 ENCOUNTER — Ambulatory Visit (INDEPENDENT_AMBULATORY_CARE_PROVIDER_SITE_OTHER): Payer: 59 | Admitting: Family Medicine

## 2013-04-13 VITALS — BP 138/99 | HR 105 | Temp 98.5°F | Wt 295.0 lb

## 2013-04-13 DIAGNOSIS — J329 Chronic sinusitis, unspecified: Secondary | ICD-10-CM | POA: Insufficient documentation

## 2013-04-13 MED ORDER — FLUCONAZOLE 150 MG PO TABS: 150.0000 mg | ORAL_TABLET | Freq: Once | ORAL | Status: DC

## 2013-04-13 MED ORDER — FLUTICASONE PROPIONATE 50 MCG/ACT NA SUSP: 2.0000 | Freq: Every day | NASAL | Status: DC

## 2013-04-13 MED ORDER — AMOXICILLIN-POT CLAVULANATE 875-125 MG PO TABS: 1.0000 | ORAL_TABLET | Freq: Two times a day (BID) | ORAL | Status: DC

## 2013-04-13 NOTE — Progress Notes (Signed)
   Subjective:    Patient ID: Kelsey Zavala, female    DOB: 01/24/1975, 38 y.o.   MRN: [id number]  HPI  URI Symptoms Cough: yes productive yes - dark mucus Runny Nose: yes Sore Throat: yes Sinus Pressure: yes Shortness of Breath: no Eyes symptoms: yes, watery eyes  Fever/Chills: resolved Nausea/Vomiting; no Diarrhea: no  Course: 11 days duration Treatments Tried: tylenol cold and flu for 3 days, switched to xyrtec with claritin  Exacerbating:  Review of Systems No rashes or joint swelling, otherwise stated in history of present illness    Objective:   Physical Exam BP 138/99  Pulse 105  Temp(Src) 98.5 F (36.9 C) (Oral)  Wt 295 lb (133.811 kg)  LMP 04/12/2013  NWG:NFAOZGen:obese female, mildly ill appearing, NAD, pleasant and conversant HEENT: NCAT, PERRLA, EOMI, OP clear and moist, no oropharyngeal exudate but mild erythema, no lymphadenopathy, no thyroid tenderness, enlargement, or nodules, neck with normal ROM, no meningismus, TM reflective without effusion  CV: RRR, no m/r/g, no JVD or carotid bruits Pulm: normal WOB, CTA-B Extremities: no edema or joint tenderness Skin: warm, dry, no rashes      Assessment & Plan:

## 2013-04-13 NOTE — Assessment & Plan Note (Signed)
Likely viral sinus infection with allergies confounding the problem. However, given the duration of > 10 days, I will prescribe augmentin with instructions to start if not improved or worsening. Also, will start steroid nasal spray. Pt agreed with plan.

## 2013-04-13 NOTE — Patient Instructions (Signed)
Ms. Adriana SimasCook,   I think you have a viral sinus infection is complicated by allergies. Given that it is been there for greater than 10 days, also a prescription for an antibiotic that he can start if you're not feeling better in the next 48 hours. Also send a prescription for steroid nasal spray that will help the inflammation of the nose and cough.  Take care,  Dr. Clinton SawyerWilliamson

## 2013-05-08 ENCOUNTER — Ambulatory Visit (INDEPENDENT_AMBULATORY_CARE_PROVIDER_SITE_OTHER): Payer: 59 | Admitting: Emergency Medicine

## 2013-05-08 ENCOUNTER — Encounter: Payer: Self-pay | Admitting: Emergency Medicine

## 2013-05-08 VITALS — BP 131/84 | HR 85 | Temp 98.5°F | Wt 293.0 lb

## 2013-05-08 DIAGNOSIS — N899 Noninflammatory disorder of vagina, unspecified: Secondary | ICD-10-CM

## 2013-05-08 DIAGNOSIS — B372 Candidiasis of skin and nail: Secondary | ICD-10-CM

## 2013-05-08 DIAGNOSIS — N898 Other specified noninflammatory disorders of vagina: Secondary | ICD-10-CM

## 2013-05-08 LAB — POCT WET PREP (WET MOUNT): CLUE CELLS WET PREP WHIFF POC: NEGATIVE

## 2013-05-08 MED ORDER — VALACYCLOVIR HCL 500 MG PO TABS: ORAL_TABLET | ORAL | Status: DC

## 2013-05-08 MED ORDER — FLUCONAZOLE 150 MG PO TABS: 150.0000 mg | ORAL_TABLET | Freq: Every day | ORAL | Status: DC

## 2013-05-08 NOTE — Progress Notes (Signed)
   Subjective:    Patient ID: Kelsey Zavala, female    DOB: 06/04/1975, 38 y.o.   MRN: [id number]  HPI Kelsey Zavala is here for a SDA for yeast infection.  She states she was on antibiotics last month for a UTI and BV.  Once she completed the antibiotics, she developed a yeast infection.  She took diflucan q3 days x3 doses with temporary improvement.  States now continues to have redness and bumps with intense itching of her outer lips and groin.  She has tried multiple creams but the only one that has helped in zinc.  Current Outpatient Prescriptions on File Prior to Visit  Medication Sig Dispense Refill  . clindamycin (CLINDAGEL) 1 % gel Apply topically 2 (two) times daily.  30 g  0  . clindamycin-benzoyl peroxide (BENZACLIN) gel Apply topically 2 (two) times daily.  25 g  0  . cyclobenzaprine (FLEXERIL) 10 MG tablet Take 1 tablet (10 mg total) by mouth 2 (two) times daily as needed for muscle spasms.  30 tablet  1  . fluticasone (FLONASE) 50 MCG/ACT nasal spray Place 2 sprays into both nostrils daily.  16 g  2  . ibuprofen (ADVIL,MOTRIN) 200 MG tablet Take 400-800 mg by mouth every 6 (six) hours as needed. Takes 2 to 4 tablets for pain       . lisinopril-hydrochlorothiazide (PRINZIDE,ZESTORETIC) 10-12.5 MG per tablet Take one tablet by mouth one time daily  30 tablet  5  . Multiple Vitamins-Minerals (MULTIVITAMINS THER. W/MINERALS) TABS Take 1 tablet by mouth daily.        . sertraline (ZOLOFT) 50 MG tablet Take 1 tablet (50 mg total) by mouth daily.  30 tablet  1  . [DISCONTINUED] citalopram (CELEXA) 20 MG tablet Take 1 tablet (20 mg total) by mouth daily.  30 tablet  3   No current facility-administered medications on file prior to visit.    I have reviewed and updated the following as appropriate: allergies and current medications SHx: non smoker   Review of Systems See HPI    Objective:   Physical Exam BP 131/84  Pulse 85  Temp(Src) 98.5 F (36.9 C) (Oral)  Wt 293 lb  (132.904 kg)  LMP 04/12/2013 Gen: alert, cooperative, NAD Pelvic: normal external genitalia; erythema and excoriations noted on outer lips and groin folds; normal vagina with minimal white discharge; normal cervix      Assessment & Plan:

## 2013-05-08 NOTE — Patient Instructions (Signed)
It was nice to meet you!  You have a yeast infection in your skin. Take Diflucan 1 pill daily for 2 weeks, then 1 pill weekly until gone.  I also sent in a refill of your Valtrex.   Follow up as needed.

## 2013-05-08 NOTE — Assessment & Plan Note (Signed)
Only temporary relief with diflucan q3 days x3 doses. Will do prolonged couse with diflucan 150mg  daily x14 days then weekly x12 weeks. Continue zinc ointment prn for symptoms. F/u as needed.

## 2013-07-25 ENCOUNTER — Ambulatory Visit: Payer: 59 | Admitting: Family Medicine

## 2013-08-03 ENCOUNTER — Other Ambulatory Visit: Payer: Self-pay | Admitting: Family Medicine

## 2013-08-08 ENCOUNTER — Encounter (HOSPITAL_COMMUNITY): Payer: Self-pay | Admitting: Emergency Medicine

## 2013-08-08 ENCOUNTER — Emergency Department (HOSPITAL_COMMUNITY)
Admission: EM | Admit: 2013-08-08 | Discharge: 2013-08-09 | Disposition: A | Discharge status: Home / Self Care | Payer: 59 | Attending: Emergency Medicine | Admitting: Emergency Medicine

## 2013-08-08 DIAGNOSIS — R350 Frequency of micturition: Secondary | ICD-10-CM | POA: Insufficient documentation

## 2013-08-08 DIAGNOSIS — M545 Low back pain, unspecified: Secondary | ICD-10-CM | POA: Diagnosis not present

## 2013-08-08 DIAGNOSIS — Z9851 Tubal ligation status: Secondary | ICD-10-CM | POA: Diagnosis not present

## 2013-08-08 DIAGNOSIS — R109 Unspecified abdominal pain: Secondary | ICD-10-CM | POA: Diagnosis present

## 2013-08-08 DIAGNOSIS — Z791 Long term (current) use of non-steroidal anti-inflammatories (NSAID): Secondary | ICD-10-CM | POA: Diagnosis not present

## 2013-08-08 DIAGNOSIS — R110 Nausea: Secondary | ICD-10-CM | POA: Insufficient documentation

## 2013-08-08 DIAGNOSIS — Z8659 Personal history of other mental and behavioral disorders: Secondary | ICD-10-CM | POA: Insufficient documentation

## 2013-08-08 DIAGNOSIS — Z8619 Personal history of other infectious and parasitic diseases: Secondary | ICD-10-CM | POA: Diagnosis not present

## 2013-08-08 DIAGNOSIS — Z3202 Encounter for pregnancy test, result negative: Secondary | ICD-10-CM | POA: Insufficient documentation

## 2013-08-08 DIAGNOSIS — I10 Essential (primary) hypertension: Secondary | ICD-10-CM | POA: Diagnosis not present

## 2013-08-08 DIAGNOSIS — Z79899 Other long term (current) drug therapy: Secondary | ICD-10-CM | POA: Diagnosis not present

## 2013-08-08 NOTE — ED Provider Notes (Signed)
CSN: [id number]     Arrival date & time 08/08/13  2119 History   First MD Initiated Contact with Patient 08/08/13 2306     Chief Complaint  Patient presents with  . Emesis  . Flank Pain     (Consider location/radiation/quality/duration/timing/severity/associated sxs/prior Treatment) HPI Patient presents with right lower back pain that does not radiate. It started yesterday and spent progressive throughout the day today. She's having urinary frequency but denies any dysuria. Denies fever/chills. Patient does complain of nausea with one episode of dry heaving. She's had no focal weakness or numbness. She has no abdominal pain. Denies any abdominal surgeries. She denies any vaginal bleeding or discharge. Past Medical History  Diagnosis Date  . HTN (hypertension)   . GAD (generalized anxiety disorder)   . Genital herpes   . Depression   . History of abnormal Pap smear    Past Surgical History  Procedure Laterality Date  . Colposcopy    . Tubal ligation     Family History  Problem Relation Age of Onset  . Heart attack Father   . Hypertension Father   . Heart attack Cousin 39  . Diabetes Paternal Aunt   . Hypertension Mother   . Kidney failure Sister    History  Substance Use Topics  . Smoking status: Never Smoker   . Smokeless tobacco: Not on file  . Alcohol Use: No   OB History   Grav Para Term Preterm Abortions TAB SAB Ect Mult Living                 Review of Systems  Constitutional: Negative for fever and chills.  Respiratory: Negative for cough and shortness of breath.   Cardiovascular: Negative for chest pain.  Gastrointestinal: Positive for nausea. Negative for vomiting, abdominal pain, diarrhea and constipation.  Genitourinary: Positive for frequency and flank pain. Negative for dysuria, hematuria, vaginal bleeding and vaginal discharge.  Musculoskeletal: Positive for back pain. Negative for neck pain and neck stiffness.  Skin: Negative for rash and wound.   Neurological: Negative for dizziness, weakness, light-headedness, numbness and headaches.  All other systems reviewed and are negative.     Allergies  Review of patient's allergies indicates no known allergies.  Home Medications   Prior to Admission medications   Medication Sig Start Date End Date Taking? Authorizing Provider  lisinopril-hydrochlorothiazide (PRINZIDE,ZESTORETIC) 10-12.5 MG per tablet TAKE ONE TABLET BY MOUTH ONE TIME DAILY  08/03/13  Yes Twana FirstBryan R Hess, DO  Multiple Vitamins-Minerals (MULTIVITAMINS THER. W/MINERALS) TABS Take 1 tablet by mouth daily.     Yes Historical Provider, MD  naproxen sodium (ANAPROX) 220 MG tablet Take 440 mg by mouth 2 (two) times daily as needed (pain).   Yes Historical Provider, MD  Omega-3 Fatty Acids (FISH OIL) 1000 MG CAPS Take 1 capsule by mouth daily.   Yes Historical Provider, MD  OVER THE COUNTER MEDICATION Take 5 mLs by mouth at bedtime. Magnesium supplement (Calm) in 8oz of water   Yes Historical Provider, MD  valACYclovir (VALTREX) 500 MG tablet Take 500 mg by mouth daily.   Yes Historical Provider, MD   BP 151/76  Pulse 108  Temp(Src) 98.5 F (36.9 C) (Oral)  Resp 20  SpO2 100%  LMP 08/07/2013 Physical Exam  Nursing note and vitals reviewed. Constitutional: She is oriented to person, place, and time. She appears well-developed and well-nourished. No distress.  HENT:  Head: Normocephalic and atraumatic.  Mouth/Throat: Oropharynx is clear and moist.  Eyes: EOM are normal.  Pupils are equal, round, and reactive to light.  Neck: Normal range of motion. Neck supple.  Cardiovascular: Normal rate and regular rhythm.   Pulmonary/Chest: Effort normal and breath sounds normal. No respiratory distress. She has no wheezes. She has no rales.  Abdominal: Soft. Bowel sounds are normal. She exhibits no distension and no mass. There is no tenderness. There is no rebound and no guarding.  Musculoskeletal: Normal range of motion. She exhibits  tenderness. She exhibits no edema.  Mild tenderness to palpation in the right lumbar paraspinal area. No frank CVA tenderness.  Neurological: She is alert and oriented to person, place, and time.  5/5 motor in all extremities. Sensation is intact. Patient is ambulatory in the emergency department.  Skin: Skin is warm and dry. No rash noted. No erythema.  Psychiatric: She has a normal mood and affect. Her behavior is normal.    ED Course  Procedures (including critical care time) Labs Review Labs Reviewed  URINALYSIS, ROUTINE W REFLEX MICROSCOPIC  POC URINE PREG, ED    Imaging Review No results found.   EKG Interpretation None      MDM   Final diagnoses:  None   Patient's symptoms have improved with medication. Urine without evidence of infection. Abdomen continues to be soft. Pain is likely musculoskeletal in origin. Return precautions given. Patient also advised to drink more fluid     Loren Racer, MD 08/09/13 [phone]

## 2013-08-08 NOTE — ED Notes (Signed)
Pt states yesterday she thought she had a UTI and so she drank cranberry juice  Pt states today she has had vomiting and pain in her right flank area and states she feels like she cannot empty her bladder all the way

## 2013-08-08 NOTE — ED Notes (Signed)
Pt is on her period therefore urine not collected in triage

## 2013-08-09 LAB — URINALYSIS, ROUTINE W REFLEX MICROSCOPIC
BILIRUBIN URINE: NEGATIVE
Glucose, UA: NEGATIVE mg/dL
HGB URINE DIPSTICK: NEGATIVE
Ketones, ur: NEGATIVE mg/dL
Leukocytes, UA: NEGATIVE
Nitrite: NEGATIVE
Protein, ur: NEGATIVE mg/dL
Specific Gravity, Urine: 1.036 — ABNORMAL HIGH (ref 1.005–1.030)
UROBILINOGEN UA: 1 mg/dL (ref 0.0–1.0)
pH: 6 (ref 5.0–8.0)

## 2013-08-09 LAB — COMPREHENSIVE METABOLIC PANEL
ALT: 20 U/L (ref 0–35)
ANION GAP: 11 (ref 5–15)
AST: 24 U/L (ref 0–37)
Albumin: 3.8 g/dL (ref 3.5–5.2)
Alkaline Phosphatase: 89 U/L (ref 39–117)
BUN: 15 mg/dL (ref 6–23)
CALCIUM: 9.2 mg/dL (ref 8.4–10.5)
CO2: 26 meq/L (ref 19–32)
CREATININE: 0.74 mg/dL (ref 0.50–1.10)
Chloride: 102 mEq/L (ref 96–112)
GLUCOSE: 97 mg/dL (ref 70–99)
Potassium: 3.4 mEq/L — ABNORMAL LOW (ref 3.7–5.3)
Sodium: 139 mEq/L (ref 137–147)
Total Bilirubin: 0.2 mg/dL — ABNORMAL LOW (ref 0.3–1.2)
Total Protein: 7.6 g/dL (ref 6.0–8.3)

## 2013-08-09 LAB — CBC WITH DIFFERENTIAL/PLATELET
Basophils Absolute: 0 10*3/uL (ref 0.0–0.1)
Basophils Relative: 1 % (ref 0–1)
EOS PCT: 5 % (ref 0–5)
Eosinophils Absolute: 0.3 10*3/uL (ref 0.0–0.7)
HEMATOCRIT: 35.9 % — AB (ref 36.0–46.0)
Hemoglobin: 12 g/dL (ref 12.0–15.0)
LYMPHS PCT: 42 % (ref 12–46)
Lymphs Abs: 2.1 10*3/uL (ref 0.7–4.0)
MCH: 30.2 pg (ref 26.0–34.0)
MCHC: 33.4 g/dL (ref 30.0–36.0)
MCV: 90.2 fL (ref 78.0–100.0)
MONO ABS: 0.5 10*3/uL (ref 0.1–1.0)
Monocytes Relative: 10 % (ref 3–12)
Neutro Abs: 2.1 10*3/uL (ref 1.7–7.7)
Neutrophils Relative %: 42 % — ABNORMAL LOW (ref 43–77)
Platelets: 306 10*3/uL (ref 150–400)
RBC: 3.98 MIL/uL (ref 3.87–5.11)
RDW: 13.2 % (ref 11.5–15.5)
WBC: 5 10*3/uL (ref 4.0–10.5)

## 2013-08-09 LAB — POC URINE PREG, ED: Preg Test, Ur: NEGATIVE

## 2013-08-09 LAB — LIPASE, BLOOD: LIPASE: 23 U/L (ref 11–59)

## 2013-08-09 MED ORDER — METHOCARBAMOL 500 MG PO TABS: 1000.0000 mg | ORAL_TABLET | Freq: Three times a day (TID) | ORAL | Status: DC | PRN

## 2013-08-09 MED ORDER — IBUPROFEN 600 MG PO TABS: 600.0000 mg | ORAL_TABLET | Freq: Four times a day (QID) | ORAL | Status: DC | PRN

## 2013-08-09 MED ORDER — METHOCARBAMOL 500 MG PO TABS
1000.0000 mg | ORAL_TABLET | Freq: Once | ORAL | Status: DC
  Filled 2013-08-09: qty 2

## 2013-08-09 MED ORDER — KETOROLAC TROMETHAMINE 60 MG/2ML IM SOLN: 60.0000 mg | Freq: Once | INTRAMUSCULAR | Status: DC

## 2013-08-09 MED ORDER — KETOROLAC TROMETHAMINE 30 MG/ML IJ SOLN
30.0000 mg | Freq: Once | INTRAMUSCULAR | Status: AC
  Administered 2013-08-09: 30 mg via INTRAVENOUS
  Filled 2013-08-09: qty 1

## 2013-08-09 NOTE — ED Notes (Signed)
Pt states that she tried to give a urine sample earlier but the sample was contaminated because of her menstrual

## 2013-08-09 NOTE — Discharge Instructions (Signed)
Back Pain, Adult Low back pain is very common. About 1 in 5 people have back pain.The cause of low back pain is rarely dangerous. The pain often gets better over time.About half of people with a sudden onset of back pain feel better in just 2 weeks. About 8 in 10 people feel better by 6 weeks.  CAUSES Some common causes of back pain include:  Strain of the muscles or ligaments supporting the spine.  Wear and tear (degeneration) of the spinal discs.  Arthritis.  Direct injury to the back. DIAGNOSIS Most of the time, the direct cause of low back pain is not known.However, back pain can be treated effectively even when the exact cause of the pain is unknown.Answering your caregiver's questions about your overall health and symptoms is one of the most accurate ways to make sure the cause of your pain is not dangerous. If your caregiver needs more information, he or she may order lab work or imaging tests (X-rays or MRIs).However, even if imaging tests show changes in your back, this usually does not require surgery. HOME CARE INSTRUCTIONS For many people, back pain returns.Since low back pain is rarely dangerous, it is often a condition that people can learn to manageon their own.   Remain active. It is stressful on the back to sit or stand in one place. Do not sit, drive, or stand in one place for more than 30 minutes at a time. Take short walks on level surfaces as soon as pain allows.Try to increase the length of time you walk each day.  Do not stay in bed.Resting more than 1 or 2 days can delay your recovery.  Do not avoid exercise or work.Your body is made to move.It is not dangerous to be active, even though your back may hurt.Your back will likely heal faster if you return to being active before your pain is gone.  Pay attention to your body when you bend and lift. Many people have less discomfortwhen lifting if they bend their knees, keep the load close to their bodies,and  avoid twisting. Often, the most comfortable positions are those that put less stress on your recovering back.  Find a comfortable position to sleep. Use a firm mattress and lie on your side with your knees slightly bent. If you lie on your back, put a pillow under your knees.  Only take over-the-counter or prescription medicines as directed by your caregiver. Over-the-counter medicines to reduce pain and inflammation are often the most helpful.Your caregiver may prescribe muscle relaxant drugs.These medicines help dull your pain so you can more quickly return to your normal activities and healthy exercise.  Put ice on the injured area.  Put ice in a plastic bag.  Place a towel between your skin and the bag.  Leave the ice on for 15-20 minutes, 03-04 times a day for the first 2 to 3 days. After that, ice and heat may be alternated to reduce pain and spasms.  Ask your caregiver about trying back exercises and gentle massage. This may be of some benefit.  Avoid feeling anxious or stressed.Stress increases muscle tension and can worsen back pain.It is important to recognize when you are anxious or stressed and learn ways to manage it.Exercise is a great option. SEEK MEDICAL CARE IF:  You have pain that is not relieved with rest or medicine.  You have pain that does not improve in 1 week.  You have new symptoms.  You are generally not feeling well. SEEK   IMMEDIATE MEDICAL CARE IF:   You have pain that radiates from your back into your legs.  You develop new bowel or bladder control problems.  You have unusual weakness or numbness in your arms or legs.  You develop nausea or vomiting.  You develop abdominal pain.  You feel faint. Document Released: 12/22/2004 Document Revised: 06/23/2011 Document Reviewed: 04/25/2013 [employer] Patient Information 2015 [employer]. This information is not intended to replace advice given to you by your health care provider. Make sure you  discuss any questions you have with your health care provider.  

## 2013-08-10 ENCOUNTER — Encounter: Payer: Self-pay | Admitting: Family Medicine

## 2013-08-10 ENCOUNTER — Ambulatory Visit (INDEPENDENT_AMBULATORY_CARE_PROVIDER_SITE_OTHER): Payer: 59 | Admitting: Family Medicine

## 2013-08-10 VITALS — BP 132/84 | HR 76 | Temp 98.3°F | Ht 68.0 in | Wt 295.4 lb

## 2013-08-10 DIAGNOSIS — M545 Low back pain, unspecified: Secondary | ICD-10-CM

## 2013-08-10 DIAGNOSIS — I10 Essential (primary) hypertension: Secondary | ICD-10-CM

## 2013-08-10 MED ORDER — HYDROCHLOROTHIAZIDE 25 MG PO TABS: 25.0000 mg | ORAL_TABLET | Freq: Every day | ORAL | Status: DC

## 2013-08-10 MED ORDER — LISINOPRIL 10 MG PO TABS: 10.0000 mg | ORAL_TABLET | Freq: Every day | ORAL | Status: DC

## 2013-08-10 NOTE — Progress Notes (Addendum)
Patient ID: Kelsey Zavala, female   DOB: 08/03/1975, 38 y.o.   MRN: [id number]  Kelsey FentonSamuel Andres Bantz, MD Phone: [phone]  Subjective:  Chief complaint-noted  Pt Here for low back pain, hypertension  Patient here complaining of low back pain for the past 3 days. She states that there is no inciting event, or traumatic event. She was seen in the ER for this one day ago. In the ER she had been unrevealing your urinalysis, normal CBC and CMP, and negative pregnancy test.   She describes that it's right low back pain that is mostly nonradiating, and when she lifts up her right leg she has some radiation down the back of her right leg. She denies saddle anesthesia, bowel or bladder dysfunction, lower extremity weakness or numbness. His had minimal relief with Aleve and ice.  Hypertension She's checked her blood pressure last night it was 154/90, in the ED yesterday it was elevated at 150s  systolic. She's concerned about her blood pressure. She's also concerned about swelling in her lower extremities. She states that she's been swelling for the last month. The swelling is improved by morning and is worse on a hot day after she's been on her feet. She previously had a reaction like this to Norvasc and is wondering if it is lisinopril.  She has had intermittent mild headache over the last 3 days, she notes that this may be due to her brother's recent death and subsequent stress. She denies chest pain, dyspnea, or palpitations.  She would like to go up on the diuretic portion of her medication due to her swelling She has taken 2 of her current pills (lisinopril 10, HCTZ 12.5) and had dizziness the following day so she understands the risks of this.    ROS- Per history of present illness  Past Medical History Patient Active Problem List   Diagnosis Date Noted  . Candidal intertrigo 05/08/2013  . Sinus infection 04/13/2013  . Acne 02/23/2011  . Lumbar back pain 02/09/2011  . DEPRESSIVE DISORDER  NOT ELSEWHERE CLASSIFIED 11/22/2009  . ORTHOSTATIC DIZZINESS 08/08/2009  . Other and unspecified ovarian cyst 09/25/2008  . PROTEINURIA, MILD 09/25/2008  . ONYCHOMYCOSIS, TOENAILS 02/21/2008  . ANXIETY STATE, UNSPECIFIED 05/02/2007  . GENITAL HERPES 08/16/2006  . HIDRADENITIS SUPPURATIVA 04/30/2006  . OBESITY, NOS 03/04/2006  . ANEMIA, OTHER, UNSPECIFIED 03/04/2006  . TOBACCO USE, QUIT 03/04/2006    Medications- reviewed and updated Current Outpatient Prescriptions  Medication Sig Dispense Refill  . ibuprofen (ADVIL,MOTRIN) 600 MG tablet Take 1 tablet (600 mg total) by mouth every 6 (six) hours as needed.  30 tablet  0  . lisinopril-hydrochlorothiazide (PRINZIDE,ZESTORETIC) 10-12.5 MG per tablet TAKE ONE TABLET BY MOUTH ONE TIME DAILY   30 tablet  4  . methocarbamol (ROBAXIN) 500 MG tablet Take 2 tablets (1,000 mg total) by mouth every 8 (eight) hours as needed for muscle spasms.  20 tablet  0  . Multiple Vitamins-Minerals (MULTIVITAMINS THER. W/MINERALS) TABS Take 1 tablet by mouth daily.        . naproxen sodium (ANAPROX) 220 MG tablet Take 440 mg by mouth 2 (two) times daily as needed (pain).      . Omega-3 Fatty Acids (FISH OIL) 1000 MG CAPS Take 1 capsule by mouth daily.      Marland Kitchen. OVER THE COUNTER MEDICATION Take 5 mLs by mouth at bedtime. Magnesium supplement (Calm) in 8oz of water      . valACYclovir (VALTREX) 500 MG tablet Take 500 mg by mouth  daily.      . [DISCONTINUED] citalopram (CELEXA) 20 MG tablet Take 1 tablet (20 mg total) by mouth daily.  30 tablet  3   No current facility-administered medications for this visit.    Objective: BP 132/84  Pulse 76  Temp(Src) 98.3 F (36.8 C) (Oral)  Ht 5\' 8"  (1.727 m)  Wt 295 lb 6.4 oz (133.993 kg)  BMI 44.93 kg/m2  LMP 08/07/2013 Gen: NAD, alert, cooperative with exam HEENT: NCAT CV: RRR, good S1/S2, no murmur Resp: CTABL, no wheezes, non-labored Ext: Trace to 1+ pitting edema to the midcalf Neuro: Alert and oriented, No  gross deficits, normal gait MSK: No bony tenderness on the lumbar or thoracic spine, paraspinal tenderness in the lower thoracic upper lumbar area on the right side.   Assessment/Plan:  HYPERTENSION, BENIGN SYSTEMIC Blood pressure slightly elevated in the ER last night and on her check at home. Normotensive today With swelling, patient would like to increase her diuretic Will switch to 10 mg lisinopril  And 25 mg HCTZ seperately Followup 2-4 weeks with PCP Discussed possibility of orthostatic hypotension   Lumbar back pain Exam and history consistent with musculoskeletal strain No red flags Patient not tolerating muscle relaxants due to sedation Treat with NSAIDs and ice and/or heat Written letter to return to work without limitations on Monday, in 4 days.      Meds ordered this encounter  Medications  . lisinopril (PRINIVIL,ZESTRIL) 10 MG tablet    Sig: Take 1 tablet (10 mg total) by mouth daily.    Dispense:  30 tablet    Refill:  2  . hydrochlorothiazide (HYDRODIURIL) 25 MG tablet    Sig: Take 1 tablet (25 mg total) by mouth daily.    Dispense:  30 tablet    Refill:  2

## 2013-08-10 NOTE — Assessment & Plan Note (Signed)
Exam and history consistent with musculoskeletal strain No red flags Patient not tolerating muscle relaxants due to sedation Treat with NSAIDs and ice and/or heat Written letter to return to work without limitations on Monday, in 4 days.

## 2013-08-10 NOTE — Patient Instructions (Addendum)
Please come back to see your PCP in 2-4 weeks  Muscle Strain A muscle strain is an injury that occurs when a muscle is stretched beyond its normal length. Usually a small number of muscle fibers are torn when this happens. Muscle strain is rated in degrees. First-degree strains have the least amount of muscle fiber tearing and pain. Second-degree and third-degree strains have increasingly more tearing and pain.  Usually, recovery from muscle strain takes 1-2 weeks. Complete healing takes 5-6 weeks.  CAUSES  Muscle strain happens when a sudden, violent force placed on a muscle stretches it too far. This may occur with lifting, sports, or a fall.  RISK FACTORS Muscle strain is especially common in athletes.  SIGNS AND SYMPTOMS At the site of the muscle strain, there may be:  Pain.  Bruising.  Swelling.  Difficulty using the muscle due to pain or lack of normal function. DIAGNOSIS  Your health care provider will perform a physical exam and ask about your medical history. TREATMENT  Often, the best treatment for a muscle strain is resting, icing, and applying cold compresses to the injured area.  HOME CARE INSTRUCTIONS   Use the PRICE method of treatment to promote muscle healing during the first 2-3 days after your injury. The PRICE method involves:  Protecting the muscle from being injured again.  Restricting your activity and resting the injured body part.  Icing your injury. To do this, put ice in a plastic bag. Place a towel between your skin and the bag. Then, apply the ice and leave it on from 15-20 minutes each hour. After the third day, switch to moist heat packs.  Apply compression to the injured area with a splint or elastic bandage. Be careful not to wrap it too tightly. This may interfere with blood circulation or increase swelling.  Elevate the injured body part above the level of your heart as often as you can.  Only take over-the-counter or prescription medicines for  pain, discomfort, or fever as directed by your health care provider.  Warming up prior to exercise helps to prevent future muscle strains. SEEK MEDICAL CARE IF:   You have increasing pain or swelling in the injured area.  You have numbness, tingling, or a significant loss of strength in the injured area. MAKE SURE YOU:   Understand these instructions.  Will watch your condition.  Will get help right away if you are not doing well or get worse. Document Released: 12/22/2004 Document Revised: 10/12/2012 Document Reviewed: 07/21/2012 [facility] Patient Information 2015 [place], [place]. This information is not intended to replace advice given to you by your health care provider. Make sure you discuss any questions you have with your health care provider.

## 2013-08-10 NOTE — Assessment & Plan Note (Addendum)
Blood pressure slightly elevated in the ER last night and on her check at home. Normotensive today With swelling, patient would like to increase her diuretic Will switch to 10 mg lisinopril  And 25 mg HCTZ seperately Followup 2-4 weeks with PCP Discussed possibility of orthostatic hypotension

## 2013-08-21 ENCOUNTER — Ambulatory Visit (INDEPENDENT_AMBULATORY_CARE_PROVIDER_SITE_OTHER): Payer: 59 | Admitting: Family Medicine

## 2013-08-21 VITALS — BP 137/90 | HR 80 | Temp 98.6°F | Wt 302.0 lb

## 2013-08-21 DIAGNOSIS — M76899 Other specified enthesopathies of unspecified lower limb, excluding foot: Secondary | ICD-10-CM

## 2013-08-21 DIAGNOSIS — M25559 Pain in unspecified hip: Secondary | ICD-10-CM

## 2013-08-21 DIAGNOSIS — M25551 Pain in right hip: Secondary | ICD-10-CM

## 2013-08-21 DIAGNOSIS — M7061 Trochanteric bursitis, right hip: Secondary | ICD-10-CM

## 2013-08-21 MED ORDER — METHYLPREDNISOLONE ACETATE 40 MG/ML IJ SUSP
40.0000 mg | Freq: Once | INTRAMUSCULAR | Status: AC
  Administered 2013-08-21: 40 mg via INTRA_ARTICULAR

## 2013-08-21 NOTE — Progress Notes (Signed)
  Subjective:    Kelsey Zavala is a 38 y.o. female who presents with right hip pain. Onset of the symptoms was 2 week ago. Inciting event: none. The patient reports the hip pain is worse with weight bearing, is aggravated by walking, is worse with ambulating, standing up, and better with leaning forward / resting and radiates to anterior thigh/ above the knee. Aggravating symptoms include: any weight bearing, rising after sitting, standing and walking. Patient has had no prior hip problems. Previous visits for this problem: yes, last seen 1 week ago by Murtis SinkSam Bradshaw, MD. Evaluation to date: none. Treatment to date: OTC analgesics, which have been somewhat effective.  The following portions of the patient's history were reviewed and updated as appropriate: allergies, current medications, past family history, past medical history, past social history, past surgical history and problem list.   Review of Systems Pertinent items are noted in HPI.   Objective:    BP 137/90  Pulse 80  Temp(Src) 98.6 F (37 C) (Oral)  Wt 302 lb (136.986 kg)  LMP 08/07/2013 Right hip: positives: decreased abduction, decreased ROM, pain with movement of hip, tenderness over greater trochanter and most pain with lateral abduction with palpation of greater trochanter  Left hip: full painless range of motion, without tenderness   Imaging: X-ray None:   Assessment:    Hip strain Trochanteric bursitis    Plan:    Natural history and expected course discussed. Questions answered. Transport plannerducational materials distributed. NSAIDs per medication orders. Steroid injection for trochanteric bursitis. See procedure note.   Procedure: Steroid injection of Greater trochanteric bursa Consent signed and scanned into record. Medication:   1cc Depomedrol  5 cc Lidocaine 1% without epi Preparation: area cleansed with alcohol Injection  Landmarks identified 6 cc of medication injected into bursal space using a lateral  approach Patient tolerated well without bleeding or paresthesias  Patient had good range of motion of joint after injection

## 2013-08-21 NOTE — Patient Instructions (Signed)
Hip Bursitis Bursitis is a swelling and soreness (inflammation) of a fluid-filled sac (bursa). This sac overlies and protects the joints.  CAUSES   Injury.  Overuse of the muscles surrounding the joint.  Arthritis.  Gout.  Infection.  Cold weather.  Inadequate warm-up and conditioning prior to activities. The cause may not be known.  SYMPTOMS   Mild to severe irritation.  Tenderness and swelling over the outside of the hip.  Pain with motion of the hip.  If the bursa becomes infected, a fever may be present. Redness, tenderness, and warmth will develop over the hip. Symptoms usually lessen in 3 to 4 weeks with treatment, but can come back. TREATMENT If conservative treatment does not work, your caregiver may advise draining the bursa and injecting cortisone into the area. This may speed up the healing process. This may also be used as an initial treatment of choice. HOME CARE INSTRUCTIONS   Apply ice to the affected area for 15-20 minutes every 3 to 4 hours while awake for the first 2 days. Put the ice in a plastic bag and place a towel between the bag of ice and your skin.  Rest the painful joint as much as possible, but continue to put the joint through a normal range of motion at least 4 times per day. When the pain lessens, begin normal, slow movements and usual activities to help prevent stiffness of the hip.  Only take over-the-counter or prescription medicines for pain, discomfort, or fever as directed by your caregiver.  Use crutches to limit weight bearing on the hip joint, if advised.  Elevate your painful hip to reduce swelling. Use pillows for propping and cushioning your legs and hips.  Gentle massage may provide comfort and decrease swelling. SEEK IMMEDIATE MEDICAL CARE IF:   Your pain increases even during treatment, or you are not improving.  You have a fever.  You have heat and inflammation over the involved bursa.  You have any other questions or  concerns. MAKE SURE YOU:   Understand these instructions.  Will watch your condition.  Will get help right away if you are not doing well or get worse. Document Released: 06/13/2001 Document Revised: 03/16/2011 Document Reviewed: 01/11/2008 [facility] Patient Information 2015 [place], [place]. This information is not intended to replace advice given to you by your health care provider. Make sure you discuss any questions you have with your health care provider.   Good to see you today. I hope that your pain is better soon.   Come back to see us if your pain does not resolve or is at least better in two weeks.   Take care, and thanks for letting us take care of you!   Devota Pacealeb Alonna Bartling, MD

## 2013-09-21 ENCOUNTER — Encounter: Payer: Self-pay | Admitting: Family Medicine

## 2013-09-21 ENCOUNTER — Ambulatory Visit (INDEPENDENT_AMBULATORY_CARE_PROVIDER_SITE_OTHER): Payer: 59 | Admitting: Family Medicine

## 2013-09-21 VITALS — Ht 68.0 in | Wt 292.3 lb

## 2013-09-21 DIAGNOSIS — E669 Obesity, unspecified: Secondary | ICD-10-CM

## 2013-09-21 NOTE — Patient Instructions (Signed)
-   Continue to get 3 meals and at least a couple of snacks.  Aim for no more than 5 hours between eating.  Eat breakfast within one hour of getting up.  - Google DASH diet or OmniHeart diet:  High in fruits and veg's, fat is low b/c any meats and dairy foods included are very lean.  - Obtain twice as many veg's as protein or carbohydrate foods for both lunch and dinner. - Keep a daily food record, including what you eat, how much, and what time.  Also include some of your thoughts and feelings around food choices, analyzing the situation when you have made choices you are not happy with AND when you are especially happy with your choices.    - Non-meat protein sources:  Beans, lentils, peas, hummus, chili, soups, burritos, veg burgers, Gardein products (quorn), tuna fish, salmon, eggs, nut butters   (nuts and seeds are high in fat, so not a great protein source b/c of the calories; 2 tbsp = 1 serving). - Bread:  The best choice is whole grain and highest-fiber.  You may want to try some of the high-protein warps.  Also, sandwich thins are low in calories.   - Your bean gumbo:  Use okra to thicken!  (You could also try adding a little [1 tbsp per 2 cups] of chia seeds).  - Protein bars:  These are snacks, not meals!  - Co to [employer].br and look for the June video by Wyona Almas, ~"Have a few chips, not the whole bag."  - WHEN YOU COME BACK NEXT TIME, WE WILL REVIEW EMOTIONAL EATING.

## 2013-09-21 NOTE — Progress Notes (Signed)
Medical Nutrition Therapy:  Appt start time: 1530 end time:  1630.  Assessment:  Primary concerns today: Weight management.  Kelsey Zavala is struggling with finding ways to manage her appetite and weight.  Has been on many diets, after which she tends to regain weight.    Learning Readiness: Change in progress; has lost some weight already; has reduced sugar intake, no eating after 8:30 PM; eating salads every day (getting sick of them), trying to exercise (bought a TM two weeks ago).    Barriers to learning/adherence to lifestyle change: stress eating.  (e.g., just lost her brother Aug 3.)  Usual eating pattern includes 3 meals and 1-4 snacks per day. Frequent foods and beverages include rice, bread, diet sweet tea, salads, frozen meals.  Avoided foods include meat, poultry, sugar, sweets, soda.   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, 7A-3p M-F); walking 6 min on 2.5% incline 3 X wk for past two weeks).    24-hr recall: (Up at 5:45 AM) B (10 AM)-   1 c blk coffee, [employer] egg white & chs burrito Snk ( AM)-    L ( PM)-  --- Snk ( PM)-   D (7 PM)-  1 salad, chs, croutons, olives, pecans, 3 tbsp dressing, chx brst,1 spinach mushrm pizza, [place] green tea Snk ( PM)-   Typical day? No. More typical is more carb's, snacks, and lunch.  Feeling down yesterday.  Chose to go to bed rather than look for sweets.   Progress Towards Goal(s):  In progress.   Nutritional Diagnosis:  Lamar-3.3 Overweight/obesity As related to energy imbalance.  As evidenced by BMI >45.    Intervention:  Nutrition education.  Handouts given during visit include:  AVS  Recipes  Demonstrated degree of understanding via:  Teach Back   Monitoring/Evaluation:  Dietary intake, exercise, and body weight in 6 week(s).

## 2013-09-26 ENCOUNTER — Ambulatory Visit (INDEPENDENT_AMBULATORY_CARE_PROVIDER_SITE_OTHER): Payer: 59 | Admitting: Family Medicine

## 2013-09-26 ENCOUNTER — Other Ambulatory Visit (HOSPITAL_COMMUNITY)
Admission: RE | Admit: 2013-09-26 | Discharge: 2013-09-26 | Disposition: A | Discharge status: Home / Self Care | Payer: 59 | Source: Ambulatory Visit | Attending: Family Medicine | Admitting: Family Medicine

## 2013-09-26 ENCOUNTER — Encounter: Payer: Self-pay | Admitting: Family Medicine

## 2013-09-26 VITALS — BP 133/78 | HR 76 | Ht 68.0 in | Wt 291.3 lb

## 2013-09-26 DIAGNOSIS — Z23 Encounter for immunization: Secondary | ICD-10-CM

## 2013-09-26 DIAGNOSIS — R300 Dysuria: Secondary | ICD-10-CM

## 2013-09-26 DIAGNOSIS — Z0000 Encounter for general adult medical examination without abnormal findings: Secondary | ICD-10-CM | POA: Insufficient documentation

## 2013-09-26 DIAGNOSIS — Z13220 Encounter for screening for lipoid disorders: Secondary | ICD-10-CM

## 2013-09-26 DIAGNOSIS — Z124 Encounter for screening for malignant neoplasm of cervix: Secondary | ICD-10-CM | POA: Diagnosis not present

## 2013-09-26 DIAGNOSIS — I10 Essential (primary) hypertension: Secondary | ICD-10-CM

## 2013-09-26 DIAGNOSIS — Z1151 Encounter for screening for human papillomavirus (HPV): Secondary | ICD-10-CM | POA: Diagnosis present

## 2013-09-26 DIAGNOSIS — Z131 Encounter for screening for diabetes mellitus: Secondary | ICD-10-CM

## 2013-09-26 LAB — POCT GLYCOSYLATED HEMOGLOBIN (HGB A1C): HEMOGLOBIN A1C: 5.4

## 2013-09-26 LAB — POCT URINALYSIS DIPSTICK
Bilirubin, UA: NEGATIVE
Blood, UA: NEGATIVE
Glucose, UA: NEGATIVE
Ketones, UA: NEGATIVE
LEUKOCYTES UA: NEGATIVE
Nitrite, UA: NEGATIVE
PROTEIN UA: NEGATIVE
Urobilinogen, UA: 0.2
pH, UA: 6

## 2013-09-26 NOTE — Progress Notes (Addendum)
Kelsey Zavala is a 38 y.o. who presents today for yearly physical.  Hx of HTN - On HCTZ and Lisinopril.  Denies any ADR  Pap Smear - Due for today, not smoker.  States remote hx of abnormal pap, unsure what dx.   Screening for Hyperlipidemia and Diabetes Mellitus - Due for A1C and Lipid Panel.  Also due for influenza vaccine today.   Past Medical History  Diagnosis Date  . HTN (hypertension)   . GAD (generalized anxiety disorder)   . Genital herpes   . Depression   . History of abnormal Pap smear     History   Social History  . Marital Status: Married    Spouse Name: N/A    Number of Children: N/A  . Years of Education: N/A   Occupational History  . Not on file.   Social History Main Topics  . Smoking status: Never Smoker   . Smokeless tobacco: Not on file  . Alcohol Use: No  . Drug Use: No  . Sexual Activity: Yes   Other Topics Concern  . Not on file   Social History Narrative   Live with three children and husband.  No EtOH or tobacco use.  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     Family History  Problem Relation Age of Onset  . Heart attack Father   . Hypertension Father   . Heart attack Cousin 39  . Diabetes Paternal Aunt   . Hypertension Mother   . Kidney failure Sister     Current Outpatient Prescriptions on File Prior to Visit  Medication Sig Dispense Refill  . hydrochlorothiazide (HYDRODIURIL) 25 MG tablet Take 1 tablet (25 mg total) by mouth daily.  30 tablet  2  . lisinopril (PRINIVIL,ZESTRIL) 10 MG tablet Take 1 tablet (10 mg total) by mouth daily.  30 tablet  2  . Multiple Vitamins-Minerals (MULTIVITAMINS THER. W/MINERALS) TABS Take 1 tablet by mouth daily.        . Omega-3 Fatty Acids (FISH OIL) 1000 MG CAPS Take 1 capsule by mouth daily.      Marland Kitchen OVER THE COUNTER MEDICATION Take 5 mLs by mouth at bedtime. Magnesium supplement (Calm) in 8oz of water      . [DISCONTINUED] citalopram (CELEXA) 20 MG tablet Take 1 tablet (20 mg total) by mouth daily.  30  tablet  3   No current facility-administered medications on file prior to visit.    Patient Information Form: Screening and ROS  AUDIT-C Score: 0 Do you feel safe in relationships? Yes.   PHQ-2:negative  Review of Symptoms  General:  Negative for nexplained weight loss, fever Skin: Negative for new or changing mole, sore that won't heal HEENT: Negative for trouble hearing, trouble seeing, ringing in ears, mouth sores, hoarseness, change in voice, dysphagia. CV:  Negative for chest pain, dyspnea, edema, palpitations Resp: Negative for cough, dyspnea, hemoptysis GI: Negative for nausea, vomiting, diarrhea, constipation, abdominal pain, melena, hematochezia. GU: Negative for dysuria, incontinence, urinary hesitance, hematuria, vaginal or penile discharge, polyuria, sexual difficulty, lumps in testicle or breasts MSK: Negative for muscle cramps or aches, joint pain or swelling Neuro: Negative for headaches, weakness, numbness, dizziness, passing out/fainting Psych: Negative for depression, anxiety, memory problems  Physical Exam Filed Vitals:   09/26/13 1652  BP: 133/78  Pulse: 76    Gen: NAD, Well nourished, Well developed HEENT: PERLA, EOMI, [place]/AT Neck: no JVD Cardio: RRR, No murmurs/gallops/rubs Lungs: CTA, no wheezes, rhonchi, crackles Abd: NABS, soft nontender  nondistended MSK: ROM normal  Neuro: CN 2-12 intact, MS 5/5 B/L UE and LE, +2 patellar and achilles relfex b/l  Psych: AAO x 3 Bimanual exam - No adnexal mass or uterine mass palpated, no TTP     Chemistry      Component Value Date/Time   NA 139 08/08/2013 2355   K 3.4* 08/08/2013 2355   CL 102 08/08/2013 2355   CO2 26 08/08/2013 2355   BUN 15 08/08/2013 2355   CREATININE 0.74 08/08/2013 2355   CREATININE 0.76 05/16/2012 1117      Component Value Date/Time   CALCIUM 9.2 08/08/2013 2355   ALKPHOS 89 08/08/2013 2355   AST 24 08/08/2013 2355   ALT 20 08/08/2013 2355   BILITOT 0.2* 08/08/2013 2355      Lab Results   Component Value Date   WBC 5.0 08/08/2013   HGB 12.0 08/08/2013   HCT 35.9* 08/08/2013   MCV 90.2 08/08/2013   PLT 306 08/08/2013   Lab Results  Component Value Date   TSH 0.990 05/16/2012   Lab Results  Component Value Date   HGBA1C 5.6 11/05/2010

## 2013-09-26 NOTE — Patient Instructions (Signed)

## 2013-09-26 NOTE — Assessment & Plan Note (Signed)
Obtain Lipid and A1C, obtain Pap, and give influenza vaccine today. Will get Micro:Creatinine as well as previous hx of HTN and state compliance with ACE.

## 2013-09-27 LAB — MICROALBUMIN / CREATININE URINE RATIO
Creatinine, Urine: 275.7 mg/dL
Microalb Creat Ratio: 1.8 mg/g (ref 0.0–30.0)
Microalb, Ur: 0.5 mg/dL (ref <2.0)

## 2013-09-28 LAB — CYTOLOGY - PAP

## 2013-09-29 ENCOUNTER — Other Ambulatory Visit: Payer: 59

## 2013-09-29 ENCOUNTER — Encounter: Payer: Self-pay | Admitting: Family Medicine

## 2013-09-29 ENCOUNTER — Telehealth: Payer: Self-pay | Admitting: Family Medicine

## 2013-09-29 DIAGNOSIS — Z13220 Encounter for screening for lipoid disorders: Secondary | ICD-10-CM

## 2013-09-29 DIAGNOSIS — Z0283 Encounter for blood-alcohol and blood-drug test: Secondary | ICD-10-CM

## 2013-09-29 LAB — LIPID PANEL
CHOL/HDL RATIO: 3.3 ratio
Cholesterol: 162 mg/dL (ref 0–200)
HDL: 49 mg/dL (ref >39)
LDL Cholesterol: 103 mg/dL — ABNORMAL HIGH (ref 0–99)
Triglycerides: 50 mg/dL (ref <150)
VLDL: 10 mg/dL (ref 0–40)

## 2013-09-29 NOTE — Progress Notes (Signed)
ALSO DID URINE SAMPLE FOR NICOTINE  Kelsey Zavala

## 2013-09-29 NOTE — Addendum Note (Signed)
Addended by: Herminio Heads on: 09/29/2013 10:23 AM   Modules accepted: Orders

## 2013-09-29 NOTE — Telephone Encounter (Signed)
Discussed results of pap smear with pt.  No questions at this time.  Recommend repeating pap in 1 yr or HPV/Pap in 3.  Twana First Kyung Muto, DO of [facility] 09/29/2013, 8:21 AM

## 2013-09-29 NOTE — Progress Notes (Signed)
FLP DONE TODAY Kelsey Zavala 

## 2013-09-29 NOTE — Addendum Note (Signed)
Addended byPerley Jain, Wesly Whisenant D on: 09/29/2013 10:22 AM   Modules accepted: Orders

## 2013-10-02 ENCOUNTER — Encounter: Payer: Self-pay | Admitting: Family Medicine

## 2013-10-03 LAB — NICOTINE/COTININE METABOLITES

## 2013-10-05 ENCOUNTER — Telehealth: Payer: Self-pay | Admitting: *Deleted

## 2013-10-05 LAB — NICOTINE SCREEN, URINE
Cotinine, Urine: <2 ng/mL
Nicotine, urine: <2 ng/mL

## 2013-10-05 NOTE — Telephone Encounter (Signed)
Form filled out and left in box for completion

## 2013-10-09 NOTE — Telephone Encounter (Signed)
Spoke with patient and informed her of below. Will fax also per her request

## 2013-10-09 NOTE — Telephone Encounter (Signed)
Completed, ready for pick up.  Thanks, Twana FirstBryan R. Paulina FusiHess, DO of [facility] 10/09/2013, 1:31 PM

## 2013-11-07 ENCOUNTER — Ambulatory Visit: Payer: 59 | Admitting: Family Medicine

## 2013-11-15 ENCOUNTER — Ambulatory Visit: Payer: 59 | Admitting: Family Medicine

## 2013-12-05 ENCOUNTER — Encounter: Payer: Self-pay | Admitting: Family Medicine

## 2013-12-05 ENCOUNTER — Ambulatory Visit (INDEPENDENT_AMBULATORY_CARE_PROVIDER_SITE_OTHER): Payer: 59 | Admitting: Family Medicine

## 2013-12-05 VITALS — BP 104/72 | HR 91 | Temp 98.1°F | Wt 296.0 lb

## 2013-12-05 DIAGNOSIS — Z207 Contact with and (suspected) exposure to pediculosis, acariasis and other infestations: Secondary | ICD-10-CM

## 2013-12-05 DIAGNOSIS — Z2089 Contact with and (suspected) exposure to other communicable diseases: Secondary | ICD-10-CM

## 2013-12-05 MED ORDER — PERMETHRIN 5 % EX CREA: 1.0000 "application " | TOPICAL_CREAM | Freq: Once | CUTANEOUS | Status: DC

## 2013-12-05 NOTE — Progress Notes (Signed)
Patient ID: Kelsey Zavala Kelsey Zavala, female   DOB: 06/09/1975, 38 y.o.   MRN: [id number]  HPI:  Pt presents for a same day appointment to discuss rash and scabies exposure.  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 Patients are also being treated. Pt has begun itching intensely over her stomach and feet, also on her hands. No sores in mouth or genitals. No new medicines or foods.   ROS: See HPI  PMFSH: hx genital herpes  PHYSICAL EXAM: BP 104/72 mmHg  Pulse 91  Temp(Src) 98.1 F (36.7 C) (Oral)  Wt 296 lb (134.265 kg) Gen: NAD HEENT: NCAT. Oropharynx clear and moist without lesions. Neuro: grossly nonfocal, speech normal Skin: erythematous skin on stomach, likely from scratching. No visible lesions on stomach, feet, or hands. A couple pruritic papules on Kelsey scalp line behind ear.  ASSESSMENT/PLAN:  1. Scabies exposure - itching consistent with possible infection. Will treat with permethrin cream, repeat in 1 week if not improving. Gave handout on how to manage items at home to prevent reinfection or spreading. F/u as needed if symptoms worsen or do not improve.   FOLLOW UP: F/u as needed if symptoms worsen or do not improve.   GrenadaBrittany J. Pollie MeyerMcIntyre, MD [facility]

## 2013-12-05 NOTE — Patient Instructions (Signed)

## 2013-12-13 ENCOUNTER — Ambulatory Visit (INDEPENDENT_AMBULATORY_CARE_PROVIDER_SITE_OTHER): Payer: 59 | Admitting: Family Medicine

## 2013-12-13 ENCOUNTER — Encounter: Payer: Self-pay | Admitting: Family Medicine

## 2013-12-13 VITALS — BP 131/82 | HR 76 | Temp 98.4°F | Wt 288.0 lb

## 2013-12-13 DIAGNOSIS — M353 Polymyalgia rheumatica: Secondary | ICD-10-CM

## 2013-12-13 LAB — TSH: TSH: 0.616 u[IU]/mL (ref 0.350–4.500)

## 2013-12-13 MED ORDER — MELOXICAM 15 MG PO TABS: 15.0000 mg | ORAL_TABLET | Freq: Every day | ORAL | Status: DC

## 2013-12-13 NOTE — Progress Notes (Signed)
   Subjective:    Patient ID: Kelsey Zavala, female    DOB: 04/30/1975, 38 y.o.   MRN: [id number]  HPI  Generalized bodyaches: Patient presents to family medicine clinic same day appointment for generalized body aches and pains. She states that approximately 2 weeks ago her body aches and pains have become worse. 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 She has recently started a weight loss program, where she is working out more frequently with a Psychologist, educationaltrainer. In addition she is working with Dr. Gerilyn PilgrimSykes and nutrition. She has lost approximately 20 pounds intentionally. She reports that she is" agonizing pain "that's not located to just one area. She has not ever felt like this before. She states she's had a back strain before, but this is completely different. She is having pain night, cannot get comfortable in her bed. She feels it's worse in the morning, and after sitting. He has difficulty even lowering to that leg she feels like she can't even bend her body. She states that her friend patted her on the back the other day, and she felt like it went through her whole body like an electric shock. She states she hit her leg accidentally on a wheelchair the other day, and she felt the pain was intense. She states she's never had problems with pain in the past. She denies any fevers, nausea, vomit, diarrhea. She is occasionally dizzy. He is taking an over-the-counter [facility]. There is no personal or family history of arthritis or gout, that she is aware. She denies any rashes, or cutaneous changes.  Never smoker  Past Medical History  Diagnosis Date  . HTN (hypertension)   . GAD (generalized anxiety disorder)   . Genital herpes   . Depression   . History of abnormal Pap smear    No Known Allergies  Review of Systems Per HPI    Objective:   Physical Exam BP 131/82 mmHg  Pulse 76  Temp(Src) 98.4 F (36.9 C) (Oral)  Wt 288 lb (130.636  kg) Gen: Very pleasant, African-American female, no acute distress, nontoxic appearance, well-developed, well-nourished, morbidly obese. HEENT: AT. [place].Bilateral eyes without injections or icterus. MMM.  CV: RRR Chest: CTAB, no wheeze or crackles Ext: No erythema. No edema. No soft tissue swelling. Greater than 11 tender points throughout body. No bony tenderness of neck, thoracic or lumbar spine. Bilateral tenderness at tibial tuberosity. Mild lateral joint line tenderness bilateral knees. Bilateral crepitus with knee left greater than right. Negative Lachman's, negative ligament laxity. Neurovascularly intact distally. Skin: No rashes, purpura or petechiae.  Neuro: Guarded slow gait. PERLA. EOMi. Alert. Cranial nerves II through XII intact. DTRs equal bilaterally Psych: Normal affect, dress, demeanor and mood. Normal speech    Assessment & Plan:

## 2013-12-13 NOTE — Patient Instructions (Signed)
Arthritis, Nonspecific Arthritis is inflammation of a joint. This usually means pain, redness, warmth or swelling are present. One or more joints may be involved. There are a number of types of arthritis. Your caregiver may not be able to tell what type of arthritis you have right away. CAUSES  The most common cause of arthritis is the wear and tear on the joint (osteoarthritis). This causes damage to the cartilage, which can break down over time. The knees, hips, back and neck are most often affected by this type of arthritis. Other types of arthritis and common causes of joint pain include:  Sprains and other injuries near the joint. Sometimes minor sprains and injuries cause pain and swelling that develop hours later.  Rheumatoid arthritis. This affects hands, feet and knees. It usually affects both sides of your body at the same time. It is often associated with chronic ailments, fever, weight loss and general weakness.  Crystal arthritis. Gout and pseudo gout can cause occasional acute severe pain, redness and swelling in the foot, ankle, or knee.  Infectious arthritis. Bacteria can get into a joint through a break in overlying skin. This can cause infection of the joint. Bacteria and viruses can also spread through the blood and affect your joints.  Drug, infectious and allergy reactions. Sometimes joints can become mildly painful and slightly swollen with these types of illnesses. SYMPTOMS   Pain is the main symptom.  Your joint or joints can also be red, swollen and warm or hot to the touch.  You may have a fever with certain types of arthritis, or even feel overall ill.  The joint with arthritis will hurt with movement. Stiffness is present with some types of arthritis. DIAGNOSIS  Your caregiver will suspect arthritis based on your description of your symptoms and on your exam. Testing may be needed to find the type of arthritis:  Blood and sometimes urine tests.  X-ray tests  and sometimes CT or MRI scans.  Removal of fluid from the joint (arthrocentesis) is done to check for bacteria, crystals or other causes. Your caregiver (or a specialist) will numb the area over the joint with a local anesthetic, and use a needle to remove joint fluid for examination. This procedure is only minimally uncomfortable.  Even with these tests, your caregiver may not be able to tell what kind of arthritis you have. Consultation with a specialist (rheumatologist) may be helpful. TREATMENT  Your caregiver will discuss with you treatment specific to your type of arthritis. If the specific type cannot be determined, then the following general recommendations may apply. Treatment of severe joint pain includes:  Rest.  Elevation.  Anti-inflammatory medication (for example, ibuprofen) may be prescribed. Avoiding activities that cause increased pain.  Only take over-the-counter or prescription medicines for pain and discomfort as recommended by your caregiver.  Cold packs over an inflamed joint may be used for 10 to 15 minutes every hour. Hot packs sometimes feel better, but do not use overnight. Do not use hot packs if you are diabetic without your caregiver's permission.  A cortisone shot into arthritic joints may help reduce pain and swelling.  Any acute arthritis that gets worse over the next 1 to 2 days needs to be looked at to be sure there is no joint infection. Long-term arthritis treatment involves modifying activities and lifestyle to reduce joint stress jarring. This can include weight loss. Also, exercise is needed to nourish the joint cartilage and remove waste. This helps keep the muscles   around the joint strong. HOME CARE INSTRUCTIONS   Do not take aspirin to relieve pain if gout is suspected. This elevates uric acid levels.  Only take over-the-counter or prescription medicines for pain, discomfort or fever as directed by your caregiver.  Rest the joint as much as  possible.  If your joint is swollen, keep it elevated.  Use crutches if the painful joint is in your leg.  Drinking plenty of fluids may help for certain types of arthritis.  Follow your caregiver's dietary instructions.  Try low-impact exercise such as:  Swimming.  Water aerobics.  Biking.  Walking.  Morning stiffness is often relieved by a warm shower.  Put your joints through regular range-of-motion. SEEK MEDICAL CARE IF:   You do not feel better in 24 hours or are getting worse.  You have side effects to medications, or are not getting better with treatment. SEEK IMMEDIATE MEDICAL CARE IF:   You have a fever.  You develop severe joint pain, swelling or redness.  Many joints are involved and become painful and swollen.  There is severe back pain and/or leg weakness.  You have loss of bowel or bladder control. Document Released: 01/30/2004 Document Revised: 03/16/2011 Document Reviewed: 02/15/2008 [facility] Patient Information 2015 [place], [place]. This information is not intended to replace advice given to you by your health care provider. Make sure you discuss any questions you have with your health care provider.  Muscle Pain Muscle pain (myalgia) may be caused by many things, including:  Overuse or muscle strain, especially if you are not in shape. This is the most common cause of muscle pain.  Injury.  Bruises.  Viruses, such as the flu.  Infectious diseases.  Fibromyalgia, which is a chronic condition that causes muscle tenderness, fatigue, and headache.  Autoimmune diseases, including lupus.  Certain drugs, including ACE inhibitors and statins. Muscle pain may be mild or severe. In most cases, the pain lasts only a short time and goes away without treatment. To diagnose the cause of your muscle pain, your health care provider will take your medical history. This means he or she will ask you when your muscle pain began and what has been happening. If  you have not had muscle pain for very long, your health care provider may want to wait before doing much testing. If your muscle pain has lasted a long time, your health care provider may want to run tests right away. If your health care provider thinks your muscle pain may be caused by illness, you may need to have additional tests to rule out certain conditions.  Treatment for muscle pain depends on the cause. Home care is often enough to relieve muscle pain. Your health care provider may also prescribe anti-inflammatory medicine. HOME CARE INSTRUCTIONS Watch your condition for any changes. The following actions may help to lessen any discomfort you are feeling:  Only take over-the-counter or prescription medicines as directed by your health care provider.  Apply ice to the sore muscle:  Put ice in a plastic bag.  Place a towel between your skin and the bag.  Leave the ice on for 15-20 minutes, 3-4 times a day.  You may alternate applying hot and cold packs to the muscle as directed by your health care provider.  If overuse is causing your muscle pain, slow down your activities until the pain goes away.  Remember that it is normal to feel some muscle pain after starting a workout program. Muscles that have not been  used often will be sore at first.  Do regular, gentle exercises if you are not usually active.  Warm up before exercising to lower your risk of muscle pain.  Do not continue working out if the pain is very bad. Bad pain could mean you have injured a muscle. SEEK MEDICAL CARE IF:  Your muscle pain gets worse, and medicines do not help.  You have muscle pain that lasts longer than 3 days.  You have a rash or fever along with muscle pain.  You have muscle pain after a tick bite.  You have muscle pain while working out, even though you are in good physical condition.  You have redness, soreness, or swelling along with muscle pain.  You have muscle pain after starting  a new medicine or changing the dose of a medicine. SEEK IMMEDIATE MEDICAL CARE IF:  You have trouble breathing.  You have trouble swallowing.  You have muscle pain along with a stiff neck, fever, and vomiting.  You have severe muscle weakness or cannot move part of your body. MAKE SURE YOU:   Understand these instructions.  Will watch your condition.  Will get help right away if you are not doing well or get worse. Document Released: 11/13/2005 Document Revised: 12/27/2012 Document Reviewed: 10/18/2012 [facility] Patient Information 2015 [place], [place]. This information is not intended to replace advice given to you by your health care provider. Make sure you discuss any questions you have with your health care provider.  Follow up with PCP in 2 weeks

## 2013-12-13 NOTE — Assessment & Plan Note (Addendum)
Uncertain etiology of polymyalgia/polyarthritis-like symptoms today. Patient's knee exam with crepitus, concerning for early arthritic changes. Fibromyalgia also be in the differential. Discussed in detail differential diagnosis of arthritis/osteoarthritis, rheumatoid arthritis, fibromyalgia etc. Labs: TSH, ANA, CRP, ESR, rheumatoid factor, CK Patient is to take a workout break over the next 7-10 days. Started patient on meloxicam daily I will call patient with results once they become available, patient to follow-up with PCP in 4 weeks.

## 2013-12-14 LAB — CBC WITH DIFFERENTIAL/PLATELET
BASOS PCT: 1 % (ref 0–1)
Basophils Absolute: 0 10*3/uL (ref 0.0–0.1)
EOS ABS: 0.2 10*3/uL (ref 0.0–0.7)
Eosinophils Relative: 4 % (ref 0–5)
HEMATOCRIT: 33.2 % — AB (ref 36.0–46.0)
Hemoglobin: 11.4 g/dL — ABNORMAL LOW (ref 12.0–15.0)
Lymphocytes Relative: 36 % (ref 12–46)
Lymphs Abs: 1.7 10*3/uL (ref 0.7–4.0)
MCH: 29.8 pg (ref 26.0–34.0)
MCHC: 34.3 g/dL (ref 30.0–36.0)
MCV: 86.9 fL (ref 78.0–100.0)
MONO ABS: 0.4 10*3/uL (ref 0.1–1.0)
MPV: 9.2 fL — ABNORMAL LOW (ref 9.4–12.4)
Monocytes Relative: 9 % (ref 3–12)
NEUTROS PCT: 50 % (ref 43–77)
Neutro Abs: 2.4 10*3/uL (ref 1.7–7.7)
Platelets: 309 10*3/uL (ref 150–400)
RBC: 3.82 MIL/uL — ABNORMAL LOW (ref 3.87–5.11)
RDW: 13.9 % (ref 11.5–15.5)
WBC: 4.7 10*3/uL (ref 4.0–10.5)

## 2013-12-14 LAB — BASIC METABOLIC PANEL
BUN: 13 mg/dL (ref 6–23)
CALCIUM: 9.1 mg/dL (ref 8.4–10.5)
CO2: 23 mEq/L (ref 19–32)
Chloride: 106 mEq/L (ref 96–112)
Creat: 0.71 mg/dL (ref 0.50–1.10)
GLUCOSE: 84 mg/dL (ref 70–99)
Potassium: 4.3 mEq/L (ref 3.5–5.3)
SODIUM: 136 meq/L (ref 135–145)

## 2013-12-14 LAB — ANA: Anti Nuclear Antibody(ANA): NEGATIVE

## 2013-12-14 LAB — C-REACTIVE PROTEIN: CRP: 0.9 mg/dL — ABNORMAL HIGH (ref <0.60)

## 2013-12-14 LAB — CK: Total CK: 93 U/L (ref 7–177)

## 2013-12-14 LAB — SEDIMENTATION RATE: Sed Rate: 9 mm/hr (ref 0–22)

## 2013-12-14 LAB — RHEUMATOID FACTOR: Rhuematoid fact SerPl-aCnc: <10 IU/mL (ref <=14)

## 2013-12-15 ENCOUNTER — Telehealth: Payer: Self-pay | Admitting: Family Medicine

## 2013-12-15 NOTE — Telephone Encounter (Signed)
Please call Kelsey Zavala, her labs all looked good. She did have a mild elevation in one of her inflammatory markers, suggesting she is experiencing inflammation likely related to her exercise routine. She is to continue to rest for the next 1-2 weeks and take the mobic daily. She should follow up with her PCP as discussed in 4 weeks or sooner if pain worsens.

## 2014-01-01 ENCOUNTER — Ambulatory Visit: Payer: 59 | Admitting: Family Medicine

## 2014-01-08 ENCOUNTER — Ambulatory Visit: Payer: 59 | Admitting: Family Medicine

## 2014-01-21 ENCOUNTER — Other Ambulatory Visit: Payer: Self-pay | Admitting: Family Medicine

## 2014-01-25 ENCOUNTER — Ambulatory Visit: Payer: 59 | Admitting: Family Medicine

## 2014-02-24 ENCOUNTER — Other Ambulatory Visit: Payer: Self-pay | Admitting: Family Medicine

## 2014-03-01 ENCOUNTER — Ambulatory Visit: Payer: Self-pay | Admitting: Family Medicine

## 2014-03-02 ENCOUNTER — Ambulatory Visit (INDEPENDENT_AMBULATORY_CARE_PROVIDER_SITE_OTHER): Payer: BLUE CROSS/BLUE SHIELD | Admitting: Family Medicine

## 2014-03-02 ENCOUNTER — Encounter: Payer: Self-pay | Admitting: Family Medicine

## 2014-03-02 ENCOUNTER — Ambulatory Visit (HOSPITAL_COMMUNITY)
Admission: RE | Admit: 2014-03-02 | Discharge: 2014-03-02 | Disposition: A | Discharge status: Home / Self Care | Payer: BLUE CROSS/BLUE SHIELD | Source: Ambulatory Visit | Attending: Family Medicine | Admitting: Family Medicine

## 2014-03-02 VITALS — BP 147/86 | HR 74 | Temp 98.0°F | Ht 68.0 in | Wt 296.0 lb

## 2014-03-02 DIAGNOSIS — R0602 Shortness of breath: Secondary | ICD-10-CM

## 2014-03-02 DIAGNOSIS — R609 Edema, unspecified: Secondary | ICD-10-CM

## 2014-03-02 DIAGNOSIS — R9431 Abnormal electrocardiogram [ECG] [EKG]: Secondary | ICD-10-CM | POA: Diagnosis not present

## 2014-03-02 LAB — CBC
HEMATOCRIT: 35.2 % — AB (ref 36.0–46.0)
Hemoglobin: 11.7 g/dL — ABNORMAL LOW (ref 12.0–15.0)
MCH: 29.6 pg (ref 26.0–34.0)
MCHC: 33.2 g/dL (ref 30.0–36.0)
MCV: 89.1 fL (ref 78.0–100.0)
MPV: 9.2 fL (ref 8.6–12.4)
PLATELETS: 321 10*3/uL (ref 150–400)
RBC: 3.95 MIL/uL (ref 3.87–5.11)
RDW: 14 % (ref 11.5–15.5)
WBC: 4.4 10*3/uL (ref 4.0–10.5)

## 2014-03-02 LAB — COMPREHENSIVE METABOLIC PANEL
ALT: 20 U/L (ref 0–35)
AST: 21 U/L (ref 0–37)
Albumin: 3.9 g/dL (ref 3.5–5.2)
Alkaline Phosphatase: 64 U/L (ref 39–117)
BUN: 13 mg/dL (ref 6–23)
CO2: 24 meq/L (ref 19–32)
CREATININE: 0.92 mg/dL (ref 0.50–1.10)
Calcium: 9.5 mg/dL (ref 8.4–10.5)
Chloride: 101 mEq/L (ref 96–112)
Glucose, Bld: 82 mg/dL (ref 70–99)
Potassium: 4.1 mEq/L (ref 3.5–5.3)
Sodium: 138 mEq/L (ref 135–145)
TOTAL PROTEIN: 6.9 g/dL (ref 6.0–8.3)
Total Bilirubin: 0.4 mg/dL (ref 0.2–1.2)

## 2014-03-02 MED ORDER — FUROSEMIDE 20 MG PO TABS: 20.0000 mg | ORAL_TABLET | Freq: Every day | ORAL | Status: DC

## 2014-03-02 NOTE — Progress Notes (Signed)
Patient ID: Kelsey Zavala, female   DOB: 12/18/1975, 39 y.o.   MRN: [id number]  HPI:  Pt presents for same day appt to discuss swelling and dyspnea.  Pt states she's been trying to lose weight and thus has been weighing herself daily. On Sunday she weighed 290.6 lb and today she is 295lb. Has noticed increased swelling in her legs over the last several days, despite propping her feet up at work and wearing compression hose. Legs feel warm, itchy, burning, and full of fluid. She has also felt SOB, especially at night, and has had to prop herself up on two pills which is not normal for her. She has had a mild runny nose. The first day the swelling started she took an extra dose of HCTZ without improvement. She does take mobic every other day but started this a couple months ago.  Has family hx of CHF but no known hx of MI's. She has not had any chest pain. She is a nonsmoker and does have a hx of HTN. No recent med changes other than starting multivitamin. No rashes.   ROS: See HPI.  PMFSH: hx genital HSV, obesity, proteinuria, ovarian cyst, HTN, polymyalgia  PHYSICAL EXAM: BP 147/86 mmHg  Pulse 74  Temp(Src) 98 F (36.7 C) (Oral)  Ht 5\' 8"  (1.727 m)  Wt 296 lb (134.265 kg)  BMI 45.02 kg/m2 Gen: NAD HEENT: NCAT, MMM Heart: RRR, slight 2/6 early systolic murmur loudest at RUSB, not radiating to carotids Lungs: normal respiratory effort, speaks in full sentences without any distress. R side clear throughout, L side slightly diminished throughout on auscultation Neuro: grossly nonfocal, speech normal Ext: bilateral lower extremities with symmetric 1-2+ pitting edema. No erythema or warmth, no calf tenderness on either side. No pitting edema appreciable on upper thighs or abdomen.  ASSESSMENT/PLAN:  Edema Edema with associated orthopnea for the last several days. No identifiable triggers other than possible dietary indiscretion. No hx of CHF or cardiac issues other than HTN. Pt is in no  distress and ambulated around clinic, maintaining O2 sat of 99-100% on room air during ambulation. EKG normal without signs of ischemia. Pt does have a murmur which appears to be new. She does not recall being told she has a murmur in the past. Plan: - CXR, echo - labs: CMET, CBC, BNP - lasix 20mg  daily x 7 days - counseled pt that if her workup shows no signs of CHF that we will not continue this medication long term, as it is not ideal to treat venous insufficiency edema with diuretics. - pt has appt scheduled with PCP on 2/29, encouraged her to keep this appt to f/u for re-evaluation.    FOLLOW UP: F/u in 3 days with PCP  GrenadaBrittany J. Pollie MeyerMcIntyre, MD [facility]

## 2014-03-02 NOTE — Patient Instructions (Signed)
It was great to see you again today!  We are checking bloodwork Also getting chest xray and echocardiogram (heart ultrasound) Try lasix 20mg  daily for the next week Follow up in 1 week with Dr. Paulina FusiHess to be re-evaluated.  Be well, Dr. Pollie MeyerMcIntyre

## 2014-03-03 DIAGNOSIS — R609 Edema, unspecified: Secondary | ICD-10-CM | POA: Insufficient documentation

## 2014-03-03 LAB — BRAIN NATRIURETIC PEPTIDE: BRAIN NATRIURETIC PEPTIDE: 11.4 pg/mL (ref 0.0–100.0)

## 2014-03-03 NOTE — Assessment & Plan Note (Signed)
Edema with associated orthopnea for the last several days. No identifiable triggers other than possible dietary indiscretion. No hx of CHF or cardiac issues other than HTN. Pt is in no distress and ambulated around clinic, maintaining O2 sat of 99-100% on room air during ambulation. EKG normal without signs of ischemia. Pt does have a murmur which appears to be new. She does not recall being told she has a murmur in the past. Plan: - CXR, echo - labs: CMET, CBC, BNP - lasix 20mg  daily x 7 days - counseled pt that if her workup shows no signs of CHF that we will not continue this medication long term, as it is not ideal to treat venous insufficiency edema with diuretics. - pt has appt scheduled with PCP on 2/29, encouraged her to keep this appt to f/u for re-evaluation.

## 2014-03-05 ENCOUNTER — Ambulatory Visit: Payer: Self-pay | Admitting: Family Medicine

## 2014-03-05 ENCOUNTER — Ambulatory Visit
Admission: RE | Admit: 2014-03-05 | Discharge: 2014-03-05 | Disposition: A | Discharge status: Home / Self Care | Payer: BLUE CROSS/BLUE SHIELD | Source: Ambulatory Visit | Attending: Family Medicine | Admitting: Family Medicine

## 2014-03-05 DIAGNOSIS — R0602 Shortness of breath: Secondary | ICD-10-CM

## 2014-03-06 ENCOUNTER — Telehealth: Payer: Self-pay | Admitting: *Deleted

## 2014-03-06 NOTE — Telephone Encounter (Signed)
Pt informed. Janelle Spellman, CMA  

## 2014-03-06 NOTE — Telephone Encounter (Signed)
-----   Message from Latrelle DodrillBrittany J McIntyre, MD sent at 03/05/2014  5:12 PM EST ----- Please inform patient that bloodwork and chest xray were NORMAL.  Latrelle DodrillBrittany J McIntyre, MD

## 2014-03-16 ENCOUNTER — Other Ambulatory Visit: Payer: Self-pay | Admitting: *Deleted

## 2014-03-16 DIAGNOSIS — M353 Polymyalgia rheumatica: Secondary | ICD-10-CM

## 2014-03-16 NOTE — Telephone Encounter (Signed)
Covering Dr TRW AutomotiveHess's box. Patient needs a follow-up for refills. It also appears that her lisinopril and HCTZ were recently refilled [facility]. Recently seen for dyspnea and was advised to follow-up on 03/05/14 with PCP. Please inform her of this. Thanks.

## 2014-03-19 ENCOUNTER — Ambulatory Visit: Payer: BLUE CROSS/BLUE SHIELD | Admitting: Family Medicine

## 2014-03-29 ENCOUNTER — Encounter: Payer: Self-pay | Admitting: Family Medicine

## 2014-03-29 ENCOUNTER — Ambulatory Visit (INDEPENDENT_AMBULATORY_CARE_PROVIDER_SITE_OTHER): Payer: BLUE CROSS/BLUE SHIELD | Admitting: Family Medicine

## 2014-03-29 NOTE — Progress Notes (Signed)
Kelsey Zavala is a 39 y.o. female who presents today for weight loss discussion.  Morbid obesity - Pt has hx of obesity for as long as she can remember.  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  She has tried multiple diets including carb restriction, commercial, and fasting without success.  Weight fluctuates but she is interested in weight loss surgery.  She denies drug use, smoking history, denies abdominal pain, unintentional weight loss, or bowel problems.    Past Medical History  Diagnosis Date  . HTN (hypertension)   . GAD (generalized anxiety disorder)   . Genital herpes   . Depression   . History of abnormal Pap smear    Family History  Problem Relation Age of Onset  . Heart attack Father   . Hypertension Father   . Heart attack Cousin 39  . Diabetes Paternal Aunt   . Hypertension Mother   . Kidney failure Sister     Current Outpatient Prescriptions on File Prior to Visit  Medication Sig Dispense Refill  . furosemide (LASIX) 20 MG tablet Take 1 tablet (20 mg total) by mouth daily. For 1 week 7 tablet 0  . hydrochlorothiazide (HYDRODIURIL) 25 MG tablet TAKE ONE TABLET BY MOUTH ONE TIME DAILY  30 tablet 3  . lisinopril (PRINIVIL,ZESTRIL) 10 MG tablet TAKE ONE TABLET BY MOUTH ONE TIME DAILY  90 tablet 3  . meloxicam (MOBIC) 15 MG tablet Take 1 tablet (15 mg total) by mouth daily. 30 tablet 2  . Multiple Vitamins-Minerals (MULTIVITAMINS THER. W/MINERALS) TABS Take 1 tablet by mouth daily.      . [DISCONTINUED] citalopram (CELEXA) 20 MG tablet Take 1 tablet (20 mg total) by mouth daily. 30 tablet 3   No current facility-administered medications on file prior to visit.    ROS: Per HPI.  All other systems reviewed and are negative.   Physical Exam Filed Vitals:   03/29/14 1019  BP: 139/84  Pulse: 73  Temp: 98.4 F (36.9 C)    Physical Examination: General appearance - NAD, morbid obesity  Chest - clear to  auscultation, no wheezes, rales or rhonchi, symmetric air entry Heart - normal rate and regular rhythm    Chemistry      Component Value Date/Time   NA 138 03/02/2014 1028   K 4.1 03/02/2014 1028   CL 101 03/02/2014 1028   CO2 24 03/02/2014 1028   BUN 13 03/02/2014 1028   CREATININE 0.92 03/02/2014 1028   CREATININE 0.74 08/08/2013 2355      Component Value Date/Time   CALCIUM 9.5 03/02/2014 1028   ALKPHOS 64 03/02/2014 1028   AST 21 03/02/2014 1028   ALT 20 03/02/2014 1028   BILITOT 0.4 03/02/2014 1028     Lab Results  Component Value Date   TSH 0.616 12/13/2013   Lab Results  Component Value Date   HGBA1C 5.4 09/26/2013

## 2014-03-29 NOTE — Assessment & Plan Note (Signed)
Discussed multiple options for pt including dietician, weight loss surgery, bariatric clinic with weight loss options.  > 50% of the 25 minute visit was spent counseling the pt on these options - She would like to see the surgical team for possible weight loss options including band, gastrectomy (sleeve), and Gastric bypass - Information given about weight loss symposium she must attend along with surgical referral placed today - F/U in 2 months to discuss her options and see how she is doing.

## 2014-05-15 ENCOUNTER — Other Ambulatory Visit: Payer: Self-pay | Admitting: *Deleted

## 2014-05-15 MED ORDER — VALACYCLOVIR HCL 500 MG PO TABS: 500.0000 mg | ORAL_TABLET | Freq: Every day | ORAL | Status: DC

## 2014-05-23 ENCOUNTER — Ambulatory Visit: Payer: BLUE CROSS/BLUE SHIELD | Admitting: Family Medicine

## 2014-05-24 ENCOUNTER — Ambulatory Visit (INDEPENDENT_AMBULATORY_CARE_PROVIDER_SITE_OTHER): Payer: BLUE CROSS/BLUE SHIELD | Admitting: Family Medicine

## 2014-05-24 ENCOUNTER — Encounter: Payer: Self-pay | Admitting: Family Medicine

## 2014-05-24 VITALS — BP 132/73 | HR 70 | Temp 98.3°F | Ht 68.0 in | Wt 296.6 lb

## 2014-05-24 DIAGNOSIS — R300 Dysuria: Secondary | ICD-10-CM | POA: Diagnosis not present

## 2014-05-24 DIAGNOSIS — R1010 Upper abdominal pain, unspecified: Secondary | ICD-10-CM | POA: Diagnosis not present

## 2014-05-24 DIAGNOSIS — R109 Unspecified abdominal pain: Secondary | ICD-10-CM

## 2014-05-24 LAB — POCT URINALYSIS DIPSTICK
BILIRUBIN UA: NEGATIVE
Glucose, UA: NEGATIVE
Ketones, UA: NEGATIVE
Leukocytes, UA: NEGATIVE
NITRITE UA: NEGATIVE
PH UA: 7
Protein, UA: NEGATIVE
RBC UA: NEGATIVE
SPEC GRAV UA: 1.02
Urobilinogen, UA: 1

## 2014-05-24 LAB — POCT URINE PREGNANCY: Preg Test, Ur: NEGATIVE

## 2014-05-24 NOTE — Progress Notes (Signed)
Patient ID: Kelsey Zavala, female   DOB: 10/16/1975, 39 y.o.   MRN: [id number]   HPI  Patient presents today for concern for UTI  Patient explains that she's had symptoms for the last 3 days including left-sided dull flank pain, increased dark urine, and suprapubic pain. She denies dysuria and blood in her urine. She also denies fever, chills, change in appetite.  She states this is not quite similar to her previous UTIs. She has increased her fluid intake and drink some cranberry juice. Her last menstrual period was 5/11 to 5/15  Smoking status noted-never smoker ROS: Per HPI  Objective: BP 132/73 mmHg  Pulse 70  Temp(Src) 98.3 F (36.8 C) (Oral)  Ht 5\' 8"  (1.727 m)  Wt 296 lb 9.6 oz (134.537 kg)  BMI 45.11 kg/m2  LMP 05/16/2014 Gen: NAD, alert, cooperative with exam HEENT: NCAT CV: RRR, good S1/S2, no murmur Resp: CTABL, no wheezes, non-labored Abd: SNTND, BS present, no guarding or organomegaly  Positive left-sided CVA tenderness Neuro: Alert and oriented, No gross deficits  Assessment and plan:  Flank pain, acute The lateral left flank pain, concern for UTI initially but no dysuria Urinalysis does not show signs of urine tract infection, will culture given her symptoms Given tenderness at the costovertebral angle will go ahead and assume that this is musculoskeletal pain, recommended 800 mg Motrin every 8 hours as needed 3 days and discussed heat. Follow-up as needed, red flags reviewed in detail     Orders Placed This Encounter  Procedures  . Urine culture  . POCT Urinalysis Dipstick  . POCT urine pregnancy

## 2014-05-24 NOTE — Patient Instructions (Signed)
Great to meet you!  It does not look like you have a UTI- but we will culture the urine to be sure  Take some ibuprofen like we discussed and drink plenty of fluids  Please come right back or seek other medical help if your pain is unbearable or if you have blood in your urine.

## 2014-05-24 NOTE — Assessment & Plan Note (Signed)
The lateral left flank pain, concern for UTI initially but no dysuria Urinalysis does not show signs of urine tract infection, will culture given her symptoms Given tenderness at the costovertebral angle will go ahead and assume that this is musculoskeletal pain, recommended 800 mg Motrin every 8 hours as needed 3 days and discussed heat. Follow-up as needed, red flags reviewed in detail

## 2014-05-28 ENCOUNTER — Telehealth: Payer: Self-pay | Admitting: Family Medicine

## 2014-05-28 LAB — URINE CULTURE: Colony Count: 75000

## 2014-05-28 MED ORDER — AMOXICILLIN 500 MG PO CAPS: 500.0000 mg | ORAL_CAPSULE | Freq: Two times a day (BID) | ORAL | Status: DC

## 2014-05-28 MED ORDER — FLUCONAZOLE 150 MG PO TABS: 150.0000 mg | ORAL_TABLET | Freq: Once | ORAL | Status: DC

## 2014-05-28 NOTE — Telephone Encounter (Signed)
Called and discussed results of patient's urine culture, 75,000 colonies of enterococcus. Even though this doesn't reach the normal threshold 100,000 colonies I'll treat her given her symptoms.  Called in Amoxicillin after discussing with her option of amoxicillin versus fosfomycin. Also given Diflucan 2 for likely yeast infection.  She is agreeable with the plan.  Murtis SinkSam Bradshaw, MD [facility] Resident, PGY-3 05/28/2014, 8:37 AM

## 2014-06-07 ENCOUNTER — Other Ambulatory Visit: Payer: Self-pay | Admitting: Family Medicine

## 2014-06-07 NOTE — Telephone Encounter (Signed)
Pt needs refill on diflucan. [facility] [address]

## 2014-06-23 ENCOUNTER — Encounter (HOSPITAL_COMMUNITY): Payer: Self-pay | Admitting: Emergency Medicine

## 2014-06-23 ENCOUNTER — Emergency Department (HOSPITAL_COMMUNITY)
Admission: EM | Admit: 2014-06-23 | Discharge: 2014-06-23 | Disposition: A | Discharge status: Home / Self Care | Payer: BLUE CROSS/BLUE SHIELD | Attending: Emergency Medicine | Admitting: Emergency Medicine

## 2014-06-23 DIAGNOSIS — S3992XA Unspecified injury of lower back, initial encounter: Secondary | ICD-10-CM | POA: Diagnosis present

## 2014-06-23 DIAGNOSIS — Y999 Unspecified external cause status: Secondary | ICD-10-CM | POA: Insufficient documentation

## 2014-06-23 DIAGNOSIS — Z8659 Personal history of other mental and behavioral disorders: Secondary | ICD-10-CM | POA: Insufficient documentation

## 2014-06-23 DIAGNOSIS — Y939 Activity, unspecified: Secondary | ICD-10-CM | POA: Insufficient documentation

## 2014-06-23 DIAGNOSIS — I10 Essential (primary) hypertension: Secondary | ICD-10-CM | POA: Diagnosis not present

## 2014-06-23 DIAGNOSIS — Y92410 Unspecified street and highway as the place of occurrence of the external cause: Secondary | ICD-10-CM | POA: Insufficient documentation

## 2014-06-23 DIAGNOSIS — Z8619 Personal history of other infectious and parasitic diseases: Secondary | ICD-10-CM | POA: Insufficient documentation

## 2014-06-23 DIAGNOSIS — Z79899 Other long term (current) drug therapy: Secondary | ICD-10-CM | POA: Diagnosis not present

## 2014-06-23 DIAGNOSIS — S4991XA Unspecified injury of right shoulder and upper arm, initial encounter: Secondary | ICD-10-CM | POA: Insufficient documentation

## 2014-06-23 DIAGNOSIS — M545 Low back pain, unspecified: Secondary | ICD-10-CM

## 2014-06-23 DIAGNOSIS — S4992XA Unspecified injury of left shoulder and upper arm, initial encounter: Secondary | ICD-10-CM | POA: Diagnosis not present

## 2014-06-23 MED ORDER — OXYCODONE-ACETAMINOPHEN 5-325 MG PO TABS: 2.0000 | ORAL_TABLET | Freq: Once | ORAL | Status: DC

## 2014-06-23 MED ORDER — OXYCODONE-ACETAMINOPHEN 5-325 MG PO TABS
2.0000 | ORAL_TABLET | ORAL | Status: DC | PRN
Start: 2014-06-23 — End: 2014-08-13

## 2014-06-23 MED ORDER — METHOCARBAMOL 500 MG PO TABS: 500.0000 mg | ORAL_TABLET | Freq: Two times a day (BID) | ORAL | Status: DC

## 2014-06-23 MED ORDER — NAPROXEN 500 MG PO TABS: 500.0000 mg | ORAL_TABLET | Freq: Two times a day (BID) | ORAL | Status: DC

## 2014-06-23 MED ORDER — IBUPROFEN 800 MG PO TABS
800.0000 mg | ORAL_TABLET | Freq: Once | ORAL | Status: AC
  Administered 2014-06-23: 800 mg via ORAL
  Filled 2014-06-23: qty 1
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
## 2014-08-13 ENCOUNTER — Ambulatory Visit (INDEPENDENT_AMBULATORY_CARE_PROVIDER_SITE_OTHER): Payer: BLUE CROSS/BLUE SHIELD | Admitting: Family Medicine

## 2014-08-13 ENCOUNTER — Other Ambulatory Visit: Payer: Self-pay | Admitting: Family Medicine

## 2014-08-13 VITALS — BP 145/81 | HR 82 | Temp 98.2°F | Ht 68.0 in | Wt 308.8 lb

## 2014-08-13 DIAGNOSIS — F4321 Adjustment disorder with depressed mood: Secondary | ICD-10-CM

## 2014-08-13 DIAGNOSIS — E669 Obesity, unspecified: Secondary | ICD-10-CM | POA: Diagnosis not present

## 2014-08-13 DIAGNOSIS — N946 Dysmenorrhea, unspecified: Secondary | ICD-10-CM

## 2014-08-13 DIAGNOSIS — R609 Edema, unspecified: Secondary | ICD-10-CM

## 2014-08-13 DIAGNOSIS — M799 Soft tissue disorder, unspecified: Secondary | ICD-10-CM

## 2014-08-13 DIAGNOSIS — M7989 Other specified soft tissue disorders: Secondary | ICD-10-CM

## 2014-08-13 DIAGNOSIS — I10 Essential (primary) hypertension: Secondary | ICD-10-CM

## 2014-08-13 DIAGNOSIS — F4320 Adjustment disorder, unspecified: Secondary | ICD-10-CM | POA: Insufficient documentation

## 2014-08-13 NOTE — Assessment & Plan Note (Addendum)
No evidence of CHF on exam.  Believe this is dependent edema.  Patient already taking HCTZ daily.  Will refrain from adding Lasix at this time.  No evidence of DVT. -Patient to restrict NaCl intake -Wear compression hose daily -Elevate LE when seated -Return precautions reviewed.

## 2014-08-13 NOTE — Patient Instructions (Addendum)
It was a pleasure seeing you today, Kelsey Zavala.  Information regarding what we discussed is included in this packet.  Please make an appointment to see me in 1 month (after 9/22 for pap smear/physical exam with fasting labs).  Plan to have a vaginal ultrasound performed.  We will schedule this for you.  Please feel free to call our office at [phone] if any questions or concerns arise.  Warm Regards, Ashly M. Gottschalk, DO Edema Edema is an abnormal buildup of fluids in your bodytissues. Edema is somewhatdependent on gravity to pull the fluid to the lowest place in your body. That makes the condition more common in the legs and thighs (lower extremities). Painless swelling of the feet and ankles is common and becomes more likely as you get older. It is also common in looser tissues, like around your eyes.  When the affected area is squeezed, the fluid may move out of that spot and leave a dent for a few moments. This dent is called pitting.  CAUSES  There are many possible causes of edema. Eating too much salt and being on your feet or sitting for a long time can cause edema in your legs and ankles. Hot weather may make edema worse. Common medical causes of edema include:  Heart failure.  Liver disease.  Kidney disease.  Weak blood vessels in your legs.  Cancer.  An injury.  Pregnancy.  Some medications.  Obesity. SYMPTOMS  Edema is usually painless.Your skin may look swollen or shiny.  DIAGNOSIS  Your health care provider may be able to diagnose edema by asking about your medical history and doing a physical exam. You may need to have tests such as X-rays, an electrocardiogram, or blood tests to check for medical conditions that may cause edema.  TREATMENT  Edema treatment depends on the cause. If you have heart, liver, or kidney disease, you need the treatment appropriate for these conditions. General treatment may include:  Elevation of the affected body part above the  level of your heart.  Compression of the affected body part. Pressure from elastic bandages or support stockings squeezes the tissues and forces fluid back into the blood vessels. This keeps fluid from entering the tissues.  Restriction of fluid and salt intake.  Use of a water pill (diuretic). These medications are appropriate only for some types of edema. They pull fluid out of your body and make you urinate more often. This gets rid of fluid and reduces swelling, but diuretics can have side effects. Only use diuretics as directed by your health care provider. HOME CARE INSTRUCTIONS   Keep the affected body part above the level of your heart when you are lying down.   Do not sit still or stand for prolonged periods.   Do not put anything directly under your knees when lying down.  Do not wear constricting clothing or garters on your upper legs.   Exercise your legs to work the fluid back into your blood vessels. This may help the swelling go down.   Wear elastic bandages or support stockings to reduce ankle swelling as directed by your health care provider.   Eat a low-salt diet to reduce fluid if your health care provider recommends it.   Only take medicines as directed by your health care provider. SEEK MEDICAL CARE IF:   Your edema is not responding to treatment.  You have heart, liver, or kidney disease and notice symptoms of edema.  You have edema in your  legs that does not improve after elevating them.   You have sudden and unexplained weight gain. SEEK IMMEDIATE MEDICAL CARE IF:   You develop shortness of breath or chest pain.   You cannot breathe when you lie down.  You develop pain, redness, or warmth in the swollen areas.   You have heart, liver, or kidney disease and suddenly get edema.  You have a fever and your symptoms suddenly get worse. MAKE SURE YOU:   Understand these instructions.  Will watch your condition.  Will get help right away if  you are not doing well or get worse. Document Released: 12/22/2004 Document Revised: 05/08/2013 Document Reviewed: 10/14/2012 [facility] Patient Information 2015 [place], [place]. This information is not intended to replace advice given to you by your health care provider. Make sure you discuss any questions you have with your health care provider. Levonorgestrel intrauterine device (IUD) What is this medicine? LEVONORGESTREL IUD (LEE voe nor jes trel) is a contraceptive (birth control) device. The device is placed inside the uterus by a healthcare professional. It is used to prevent pregnancy and can also be used to treat heavy bleeding that occurs during your period. Depending on the device, it can be used for 3 to 5 years. This medicine may be used for other purposes; ask your health care provider or pharmacist if you have questions. COMMON BRAND NAME(S): Elveria Royals What should I tell my health care provider before I take this medicine? They need to know if you have any of these conditions: -abnormal Pap smear -cancer of the breast, uterus, or cervix -diabetes -endometritis -genital or pelvic infection now or in the past -have more than one sexual partner or your partner has more than one partner -heart disease -history of an ectopic or tubal pregnancy -immune system problems -IUD in place -liver disease or tumor -problems with blood clots or take blood-thinners -use intravenous drugs -uterus of unusual shape -vaginal bleeding that has not been explained -an unusual or allergic reaction to levonorgestrel, other hormones, silicone, or polyethylene, medicines, foods, dyes, or preservatives -pregnant or trying to get pregnant -breast-feeding How should I use this medicine? This device is placed inside the uterus by a health care professional. Talk to your pediatrician regarding the use of this medicine in children. Special care may be needed. Overdosage: If you think you have  taken too much of this medicine contact a poison control center or emergency room at once. NOTE: This medicine is only for you. Do not share this medicine with others. What if I miss a dose? This does not apply. What may interact with this medicine? Do not take this medicine with any of the following medications: -amprenavir -bosentan -fosamprenavir This medicine may also interact with the following medications: -aprepitant -barbiturate medicines for inducing sleep or treating seizures -bexarotene -griseofulvin -medicines to treat seizures like carbamazepine, ethotoin, felbamate, oxcarbazepine, phenytoin, topiramate -modafinil -pioglitazone -rifabutin -rifampin -rifapentine -some medicines to treat HIV infection like atazanavir, indinavir, lopinavir, nelfinavir, tipranavir, ritonavir -St. John's wort -warfarin This list may not describe all possible interactions. Give your health care provider a list of all the medicines, herbs, non-prescription drugs, or dietary supplements you use. Also tell them if you smoke, drink alcohol, or use illegal drugs. Some items may interact with your medicine. What should I watch for while using this medicine? Visit your doctor or health care professional for regular check ups. See your doctor if you or your partner has sexual contact with others, becomes HIV positive, or  gets a sexual transmitted disease. This product does not protect you against HIV infection (AIDS) or other sexually transmitted diseases. You can check the placement of the IUD yourself by reaching up to the top of your vagina with clean fingers to feel the threads. Do not pull on the threads. It is a good habit to check placement after each menstrual period. Call your doctor right away if you feel more of the IUD than just the threads or if you cannot feel the threads at all. The IUD may come out by itself. You may become pregnant if the device comes out. If you notice that the IUD has  come out use a backup birth control method like condoms and call your health care provider. Using tampons will not change the position of the IUD and are okay to use during your period. What side effects may I notice from receiving this medicine? Side effects that you should report to your doctor or health care professional as soon as possible: -allergic reactions like skin rash, itching or hives, swelling of the face, lips, or tongue -fever, flu-like symptoms -genital sores -high blood pressure -no menstrual period for 6 weeks during use -pain, swelling, warmth in the leg -pelvic pain or tenderness -severe or sudden headache -signs of pregnancy -stomach cramping -sudden shortness of breath -trouble with balance, talking, or walking -unusual vaginal bleeding, discharge -yellowing of the eyes or skin Side effects that usually do not require medical attention (report to your doctor or health care professional if they continue or are bothersome): -acne -breast pain -change in sex drive or performance -changes in weight -cramping, dizziness, or faintness while the device is being inserted -headache -irregular menstrual bleeding within first 3 to 6 months of use -nausea This list may not describe all possible side effects. Call your doctor for medical advice about side effects. You may report side effects to FDA at 1-800-FDA-1088. Where should I keep my medicine? This does not apply. NOTE: This sheet is a summary. It may not cover all possible information. If you have questions about this medicine, talk to your doctor, pharmacist, or health care provider.  2015, [employer]. (2011-01-22 13:54:04)

## 2014-08-13 NOTE — Progress Notes (Addendum)
Patient ID: Kelsey Zavala, female   DOB: [birth year], 39 y.o.   MRN: [id number]    Subjective: CC: grief, cyst HPI: Patient is a 39 y.o. female presenting to clinic today for office visit. Concerns today include:  1. Grief Mother passed suddenly about 1 month ago.  She passed from an aneurysm.  Patient reports that she is feeling paranoid now.  Feeling worried.  Denies depression, anxiety.  She is worried about her weight.  She has been sleeping poorly because she can't shut her brain off.  Has not been using anything for sleep yet.  Sleeping for 4 hours at a time.  Normally sleeps 8 hours.  Has been crying.  Appetite has been all over the place, sometimes not eating much at all, sometimes eating too much.    2. Weight She reports that she does not exercise, she does not sleep well.  Patient is unsure if she snores.  Was exercising regularly before her mother passed.  Has seen Dr Gerilyn Pilgrim in the past.  She did not feel that this benefited her much.  [place]  Has treadmill in garage.    3. Swelling in the feet Patient reports that she has noticed this since mother passed away.  She believes that this is from salt intake.  Not elevating legs or using compression stockings.  Has a pair at home that she could use.  No CP, SOB, orthopnea.   4. Generalized abdominal discomfort Reports that this ongoing for years.  She has been treated as UTI only.  Patient's last menstrual period was 07/16/2014 (exact date).  Patient reports that periods are heavy.  Has to take Motrin/ Aleve regularly.  Super painful periods.  Has BTL 11 years ago.  Had an abnormal pap last year.  Will need to repeat pap.  Has never used OCPs.  Periods last about 7 days.  Period is heavy 4-5 of those days.  Soaking through tampons/pads.  Changing frequently.  5. Cyst Patient reports that she developed a cyst on RUE on Wednesday.  She reports that it is bothering her because she notices it.  No fevers, no bruising. +  TTP. She has used a warm compress for the first couple of days.  Describes area as achy/throbbing that comes and goes.  Is not sure what brings it on.  Warm/Cold compress make it better.  No injury, no weakness, sensation changes, fevers.  Social History Reviewed: non smoker. FamHx and MedHx updated.  Please see EMR. Health Maintenance: needs pap (abnml pap last year)  ROS: All other systems reviewed and are negative.  Objective: Office vital signs reviewed. BP 145/81 mmHg  Pulse 82  Temp(Src) 98.2 F (36.8 C) (Oral)  Ht  (1.727 m)  Wt 308 lb 12.8 oz (140.071 kg)  BMI 46.96 kg/m2  LMP 07/16/2014 (Exact Date)  Physical Examination:  General: Awake, alert, obese female, NAD HEENT: Normal, EOMI, MMM Cardio: RRR, S1S2 heard, no murmurs appreciated Pulm: CTAB, no wheezes, rhonchi or rales, normal WOB Extremities: WWP, +1 pitting edema to shins b/l, no cyanosis or clubbing; +2 pulses bilaterally  RUE lateral aspect with sub-q mobile, well circumscribed mass.  No erythema, induration or ecchymosis.  MSK: Normal gait and station Skin: dry, intact, no rashes or lesions Psych: good eye contact, mood stable, speech normal  PHQ 9 score: 12  Assessment/ plan 39 y.o. female with   1. Morbid obesity See EMR for plan  2. Dysmenorrhea - US Transvaginal  Non-OB; Future - See EMR for plan  3. Soft tissue mass RUE - cyst vs lipoma.  Low suspicion for abscess given lack of infective type symptoms - will continue to monitor.  If increasing in size, will consider referral to gen surgery for removal. - return precautions reviewed with patient  4. Grieving - See EMR for plan  5. LE edema - See EMR for plan   Raliegh Ip, DO PGY-2, [facility]

## 2014-08-13 NOTE — Assessment & Plan Note (Signed)
Menorrhagia and dysmenorrhea.  H/o of ASCUS on last pap.   -Transvaginal u/s to assess for possible fibroids -Schedule pap for September.  Will do co-testing -Consider IUD placement if pap normal.

## 2014-08-13 NOTE — Assessment & Plan Note (Addendum)
Patient appears to be suffering from grief reaction. PHQ 12 -No medications at this time. -Patient to call [facility] [place] to schedule grief counseling -Will be happy to assist if patient chooses to seek private therapy -No SI/HI.  Return precautions reviewed -Will continue to provide support as able.

## 2014-08-13 NOTE — Assessment & Plan Note (Signed)
Patient interested in [place].  Has seen dietician. -Patient to work on diet and exercise. -I am amenable to starting a non stimulant to assist patient with weight goals.  I will discuss patient's case with Dr Raymondo Band in choosing safest option for patient.

## 2014-08-14 ENCOUNTER — Telehealth: Payer: Self-pay | Admitting: Family Medicine

## 2014-08-14 ENCOUNTER — Other Ambulatory Visit: Payer: Self-pay | Admitting: Family Medicine

## 2014-08-14 DIAGNOSIS — N946 Dysmenorrhea, unspecified: Secondary | ICD-10-CM

## 2014-08-14 NOTE — Telephone Encounter (Signed)
Reviewed patient's chart with Dr Raymondo Band.  I spoke to patient on the phone regarding weight loss meds.  Kelsey Zavala will see me in office on 8/25 @ 4:15pm.  We will initiate wellbutrin xl 150 on that date after setting clear weight loss goals/expectations.  Patient voiced good understanding and appreciation.  [name] M. Nadine Counts, DO PGY-2, [facility]

## 2014-08-15 NOTE — Telephone Encounter (Signed)
Pt has checked into having ultrasound. She was told her insurance may pay up to $200 and she would be responsible for the rest Pt cannot affford this as this time Are there other options available? Please advise

## 2014-08-16 ENCOUNTER — Telehealth: Payer: Self-pay | Admitting: Family Medicine

## 2014-08-16 NOTE — Telephone Encounter (Signed)
Discussed with patient.  U/s is not urgent.  Likely will not change plan.  Still needs to have pap redone in Sept.  IF any changes, will plan on colpo/bx.  Otherwise, will plan to continue with IUD.

## 2014-08-16 NOTE — Telephone Encounter (Signed)
Spoke to pt on phone see note.

## 2014-08-17 ENCOUNTER — Other Ambulatory Visit: Payer: Self-pay | Admitting: *Deleted

## 2014-08-17 ENCOUNTER — Ambulatory Visit (HOSPITAL_COMMUNITY): Admission: RE | Admit: 2014-08-17 | Payer: BLUE CROSS/BLUE SHIELD | Source: Ambulatory Visit

## 2014-08-17 MED ORDER — LISINOPRIL 10 MG PO TABS: 10.0000 mg | ORAL_TABLET | Freq: Every day | ORAL | Status: DC

## 2014-08-17 MED ORDER — HYDROCHLOROTHIAZIDE 25 MG PO TABS: 25.0000 mg | ORAL_TABLET | Freq: Every day | ORAL | Status: DC

## 2014-08-17 MED ORDER — VALACYCLOVIR HCL 500 MG PO TABS: 500.0000 mg | ORAL_TABLET | Freq: Every day | ORAL | Status: DC

## 2014-08-30 ENCOUNTER — Ambulatory Visit (INDEPENDENT_AMBULATORY_CARE_PROVIDER_SITE_OTHER): Payer: BLUE CROSS/BLUE SHIELD | Admitting: Family Medicine

## 2014-08-30 VITALS — Temp 99.0°F | Wt 305.6 lb

## 2014-08-30 DIAGNOSIS — Z713 Dietary counseling and surveillance: Secondary | ICD-10-CM

## 2014-08-30 DIAGNOSIS — F4321 Adjustment disorder with depressed mood: Secondary | ICD-10-CM

## 2014-08-30 DIAGNOSIS — F4320 Adjustment disorder, unspecified: Secondary | ICD-10-CM

## 2014-08-30 MED ORDER — BUPROPION HCL ER (XL) 150 MG PO TB24: ORAL_TABLET | ORAL | Status: DC

## 2014-08-30 NOTE — Patient Instructions (Addendum)
Plan to see me in 3 months for weight check in.  If everything is going well at that time, we will plan to check in with each other via telephone.  Wellbutrin has been sent into your pharmacy.  Take this each morning.  Jalien Weakland M. Nadine Counts, DO PGY-2, [facility]  Folliculitis  Folliculitis is redness, soreness, and swelling (inflammation) of the hair follicles. This condition can occur anywhere on the body. People with weakened immune systems, diabetes, or obesity have a greater risk of getting folliculitis. CAUSES  Bacterial infection. This is the most common cause.  Fungal infection.  Viral infection.  Contact with certain chemicals, especially oils and tars. Long-term folliculitis can result from bacteria that live in the nostrils. The bacteria may trigger multiple outbreaks of folliculitis over time. SYMPTOMS Folliculitis most commonly occurs on the scalp, thighs, legs, back, buttocks, and areas where hair is shaved frequently. An early sign of folliculitis is a small, white or yellow, pus-filled, itchy lesion (pustule). These lesions appear on a red, inflamed follicle. They are usually less than 0.2 inches (5 mm) wide. When there is an infection of the follicle that goes deeper, it becomes a boil or furuncle. A group of closely packed boils creates a larger lesion (carbuncle). Carbuncles tend to occur in hairy, sweaty areas of the body. DIAGNOSIS  Your caregiver can usually tell what is wrong by doing a physical exam. A sample may be taken from one of the lesions and tested in a lab. This can help determine what is causing your folliculitis. TREATMENT  Treatment may include:  Applying warm compresses to the affected areas.  Taking antibiotic medicines orally or applying them to the skin.  Draining the lesions if they contain a large amount of pus or fluid.  Laser hair removal for cases of long-lasting folliculitis. This helps to prevent regrowth of the hair. HOME CARE  INSTRUCTIONS  Apply warm compresses to the affected areas as directed by your caregiver.  If antibiotics are prescribed, take them as directed. Finish them even if you start to feel better.  You may take over-the-counter medicines to relieve itching.  Do not shave irritated skin.  Follow up with your caregiver as directed. SEEK IMMEDIATE MEDICAL CARE IF:   You have increasing redness, swelling, or pain in the affected area.  You have a fever. MAKE SURE YOU:  Understand these instructions.  Will watch your condition.  Will get help right away if you are not doing well or get worse. Document Released: 03/02/2001 Document Revised: 06/23/2011 Document Reviewed: 03/24/2011 [facility] Patient Information 2015 [place], [place]. This information is not intended to replace advice given to you by your health care provider. Make sure you discuss any questions you have with your health care provider.  Exercise to Lose Weight Exercise and a healthy diet may help you lose weight. Your doctor may suggest specific exercises. EXERCISE IDEAS AND TIPS  Choose low-cost things you enjoy doing, such as walking, bicycling, or exercising to workout videos.  Take stairs instead of the elevator.  Walk during your lunch break.  Park your car further away from work or school.  Go to a gym or an exercise class.  Start with 5 to 10 minutes of exercise each day. Build up to 30 minutes of exercise 4 to 6 days a week.  Wear shoes with good support and comfortable clothes.  Stretch before and after working out.  Work out until you breathe harder and your heart beats faster.  Drink  extra water when you exercise.  Do not do so much that you hurt yourself, feel dizzy, or get very short of breath. Exercises that burn about 150 calories:  Running 1  miles in 15 minutes.  Playing volleyball for 45 to 60 minutes.  Washing and waxing a car for 45 to 60 minutes.  Playing touch football for 45  minutes.  Walking 1  miles in 35 minutes.  Pushing a stroller 1  miles in 30 minutes.  Playing basketball for 30 minutes.  Raking leaves for 30 minutes.  Bicycling 5 miles in 30 minutes.  Walking 2 miles in 30 minutes.  Dancing for 30 minutes.  Shoveling snow for 15 minutes.  Swimming laps for 20 minutes.  Walking up stairs for 15 minutes.  Bicycling 4 miles in 15 minutes.  Gardening for 30 to 45 minutes.  Jumping rope for 15 minutes.  Washing windows or floors for 45 to 60 minutes. Document Released: 01/24/2010 Document Revised: 03/16/2011 Document Reviewed: 01/24/2010 [facility] Patient Information 2015 [place], [place]. This information is not intended to replace advice given to you by your health care provider. Make sure you discuss any questions you have with your health care provider.  Calorie Counting for Weight Loss Calories are energy you get from the things you eat and drink. Your body uses this energy to keep you going throughout the day. The number of calories you eat affects your weight. When you eat more calories than your body needs, your body stores the extra calories as fat. When you eat fewer calories than your body needs, your body burns fat to get the energy it needs. Calorie counting means keeping track of how many calories you eat and drink each day. If you make sure to eat fewer calories than your body needs, you should lose weight. In order for calorie counting to work, you will need to eat the number of calories that are right for you in a day to lose a healthy amount of weight per week. A healthy amount of weight to lose per week is usually 1-2 lb (0.5-0.9 kg). A dietitian can determine how many calories you need in a day and give you suggestions on how to reach your calorie goal.  WHAT IS MY MY PLAN? My goal is to have __________ calories per day.  If I have this many calories per day, I should lose around ____1-2______ pounds per week. WHAT DO I NEED  TO KNOW ABOUT CALORIE COUNTING? In order to meet your daily calorie goal, you will need to:  Find out how many calories are in each food you would like to eat. Try to do this before you eat.  Decide how much of the food you can eat.  Write down what you ate and how many calories it had. Doing this is called keeping a food log. WHERE DO I FIND CALORIE INFORMATION? The number of calories in a food can be found on a Nutrition Facts label. Note that all the information on a label is based on a specific serving of the food. If a food does not have a Nutrition Facts label, try to look up the calories online or ask your dietitian for help. HOW DO I DECIDE HOW MUCH TO EAT? To decide how much of the food you can eat, you will need to consider both the number of calories in one serving and the size of one serving. This information can be found on the Nutrition Facts label. If a food  does not have a Nutrition Facts label, look up the information online or ask your dietitian for help. Remember that calories are listed per serving. If you choose to have more than one serving of a food, you will have to multiply the calories per serving by the amount of servings you plan to eat. For example, the label on a package of bread might say that a serving size is 1 slice and that there are 90 calories in a serving. If you eat 1 slice, you will have eaten 90 calories. If you eat 2 slices, you will have eaten 180 calories. HOW DO I KEEP A FOOD LOG? After each meal, record the following information in your food log:  What you ate.  How much of it you ate.  How many calories it had.  Then, add up your calories. Keep your food log near you, such as in a small notebook in your pocket. Another option is to use a mobile app or website. Some programs will calculate calories for you and show you how many calories you have left each time you add an item to the log. WHAT ARE SOME CALORIE COUNTING TIPS?  Use your calories on  foods and drinks that will fill you up and not leave you hungry. Some examples of this include foods like nuts and nut butters, vegetables, lean proteins, and high-fiber foods (more than 5 g fiber per serving).  Eat nutritious foods and avoid empty calories. Empty calories are calories you get from foods or beverages that do not have many nutrients, such as candy and soda. It is better to have a nutritious high-calorie food (such as an avocado) than a food with few nutrients (such as a bag of chips).  Know how many calories are in the foods you eat most often. This way, you do not have to look up how many calories they have each time you eat them.  Look out for foods that may seem like low-calorie foods but are really high-calorie foods, such as baked goods, soda, and fat-free candy.  Pay attention to calories in drinks. Drinks such as sodas, specialty coffee drinks, alcohol, and juices have a lot of calories yet do not fill you up. Choose low-calorie drinks like water and diet drinks.  Focus your calorie counting efforts on higher calorie items. Logging the calories in a garden salad that contains only vegetables is less important than calculating the calories in a milk shake.  Find a way of tracking calories that works for you. Get creative. Most people who are successful find ways to keep track of how much they eat in a day, even if they do not count every calorie. WHAT ARE SOME PORTION CONTROL TIPS?  Know how many calories are in a serving. This will help you know how many servings of a certain food you can have.  Use a measuring cup to measure serving sizes. This is helpful when you start out. With time, you will be able to estimate serving sizes for some foods.  Take some time to put servings of different foods on your favorite plates, bowls, and cups so you know what a serving looks like.  Try not to eat straight from a bag or box. Doing this can lead to overeating. Put the amount you  would like to eat in a cup or on a plate to make sure you are eating the right portion.  Use smaller plates, glasses, and bowls to prevent overeating. This is a  quick and easy way to practice portion control. If your plate is smaller, less food can fit on it.  Try not to multitask while eating, such as watching TV or using your computer. If it is time to eat, sit down at a table and enjoy your food. Doing this will help you to start recognizing when you are full. It will also make you more aware of what and how much you are eating. HOW CAN I CALORIE COUNT WHEN EATING OUT?  Ask for smaller portion sizes or child-sized portions.  Consider sharing an entree and sides instead of getting your own entree.  If you get your own entree, eat only half. Ask for a box at the beginning of your meal and put the rest of your entree in it so you are not tempted to eat it.  Look for the calories on the menu. If calories are listed, choose the lower calorie options.  Choose dishes that include vegetables, fruits, whole grains, low-fat dairy products, and lean protein. Focusing on smart food choices from each of the 5 food groups can help you stay on track at restaurants.  Choose items that are boiled, broiled, grilled, or steamed.  Choose water, milk, unsweetened iced tea, or other drinks without added sugars. If you want an alcoholic beverage, choose a lower calorie option. For example, a regular margarita can have up to 700 calories and a glass of wine has around 150.  Stay away from items that are buttered, battered, fried, or served with cream sauce. Items labeled "crispy" are usually fried, unless stated otherwise.  Ask for dressings, sauces, and syrups on the side. These are usually very high in calories, so do not eat much of them.  Watch out for salads. Many people think salads are a healthy option, but this is often not the case. Many salads come with bacon, fried chicken, lots of cheese, fried  chips, and dressing. All of these items have a lot of calories. If you want a salad, choose a garden salad and ask for grilled meats or steak. Ask for the dressing on the side, or ask for olive oil and vinegar or lemon to use as dressing.  Estimate how many servings of a food you are given. For example, a serving of cooked rice is  cup or about the size of half a tennis ball or one cupcake wrapper. Knowing serving sizes will help you be aware of how much food you are eating at restaurants. The list below tells you how big or small some common portion sizes are based on everyday objects.  1 oz--4 stacked dice.  3 oz--1 deck of cards.  1 tsp--1 dice.  1 Tbsp-- a Ping-Pong ball.  2 Tbsp--1 Ping-Pong ball.   cup--1 tennis ball or 1 cupcake wrapper.  1 cup--1 baseball. Document Released: 12/22/2004 Document Revised: 05/08/2013 Document Reviewed: 10/27/2012 [facility] Patient Information 2015 [place], [place]. This information is not intended to replace advice given to you by your health care provider. Make sure you discuss any questions you have with your health care provider.  Daily Weight Record It is important to weigh yourself daily. Keep this daily weight chart near your scale. Weigh yourself each morning at the same time. Weigh yourself without shoes and with the same amount of clothes each day. Compare today's weight to yesterday's weight. Bring this form with you to your follow-up appointments. Call your caregiver if you gain 03 lb/1.4 kg in 1 day. Call your caregiver if you  gain 05 lb/2.3 kg in a week. Date: ________ Weight: ____________________ Date: ________ Weight: ____________________ Date: ________ Weight: ____________________ Date: ________ Weight: ____________________ Date: ________ Weight: ____________________ Date: ________ Weight: ____________________ Date: ________ Weight: ____________________ Date: ________ Weight: ____________________ Date: ________ Weight:  ____________________ Date: ________ Weight: ____________________ Date: ________ Weight: ____________________ Date: ________ Weight: ____________________ Date: ________ Weight: ____________________ Date: ________ Weight: ____________________ Date: ________ Weight: ____________________ Date: ________ Weight: ____________________ Date: ________ Weight: ____________________ Date: ________ Weight: ____________________ Date: ________ Weight: ____________________ Date: ________ Weight: ____________________ Date: ________ Weight: ____________________ Date: ________ Weight: ____________________ Date: ________ Weight: ____________________ Date: ________ Weight: ____________________ Date: ________ Weight: ____________________ Date: ________ Weight: ____________________ Date: ________ Weight: ____________________ Date: ________ Weight: ____________________ Date: ________ Weight: ____________________ Date: ________ Weight: ____________________ Date: ________ Weight: ____________________ Date: ________ Weight: ____________________ Date: ________ Weight: ____________________ Date: ________ Weight: ____________________ Date: ________ Weight: ____________________ Date: ________ Weight: ____________________ Date: ________ Weight: ____________________ Date: ________ Weight: ____________________ Date: ________ Weight: ____________________ Date: ________ Weight: ____________________ Date: ________ Weight: ____________________ Date: ________ Weight: ____________________ Date: ________ Weight: ____________________ Date: ________ Weight: ____________________ Date: ________ Weight: ____________________ Date: ________ Weight: ____________________ Date: ________ Weight: ____________________ Date: ________ Weight: ____________________ Date: ________ Weight: ____________________ Date: ________ Weight: ____________________ Document Released: 03/05/2006 Document Revised: 03/16/2011 Document Reviewed: 12/10/2006 [employer]  Patient Information 2015 [place] [place]. This information is not intended to replace advice given to you by your health care provider. Make sure you discuss any questions you have with your health care provider.

## 2014-08-30 NOTE — Progress Notes (Signed)
Patient ID: Kelsey Zavala, female   DOB: 1975-12-31, 39 y.o.   MRN: [id number]    Subjective: LN:LGXQJJ loss HPI: Patient is a 39 y.o. female presenting to clinic today for office visit. Concerns today include:  1. Weight Loss Patient reports that she has seen Dr Jenne Campus in the past. She did not feel that this benefited her much. She has been doing diet modification since the last time we met.  She has decreased salt intake and stopped eating fried foods.  She has also been keeping a food journal.  Using a small plate and reducing portion sizes.  She continues to eat foods that she enjoys like ice cream but eats these in smaller portions.  She reports that she is still not exercising regularly.  Still not sleeping well.  Patient is unsure if she snores. She reports that she works all day and her free time is spent with her children.  Has treadmill in garage.  She is interested in Imogene but is willing to use Wellbutrin first.  Has lost 3 lbs since last visit.  She reports success with dieting in the past but states that life events often derail her progress.  Lowest weight achieved with lifestyle modification was around 230 lbs.  2. Grief reaction Patient reports that she has been doing somewhat better since last visit.  She does voice some anxiety about cleaning out her mother's belongings in her basement.  She has relatives coming to assist with this process.  She has been making the lifestyle modifications as above and has noticed a difference in her mental well being thus far.  Denies depressive symptoms currently.  No h/o of seizure d/o.  Food loves: ice cream Weight Goal: 260-270 lb Goal date: 1 year, 2 lbs per week Support: daughter and father  Social History Reviewed: non smoker. FamHx and MedHx updated.  Please see EMR. Health Maintenance: annual physical sched for 09/2014  ROS: All other systems reviewed and are negative.  Objective: Office vital signs reviewed. Temp(Src) 99 F  (37.2 C) (Oral)  Wt 305 lb 9.6 oz (138.619 kg)  LMP 08/30/2014  Physical Examination:  General: Awake, alert, obese female, well appearing, NAD HEENT: Normal, EOMI, MMM Cardio: RRR, S1S2 heard, no murmurs appreciated Pulm: CTAB, no wheezes, rhonchi or rales, normal WOB Extremities: WWP, No edema, cyanosis or clubbing; +2 pulses bilaterally MSK: Normal gait and station  Assessment/ Plan: 39 y.o. female  1. Encounter for weight loss counseling, Goal: 30-40 lb weight loss by 08/2015.  - buPROPion (WELLBUTRIN XL) 150 MG 24 hr tablet; Take 1 tablet ($RemoveB'150mg'LHNhdDzp$ ) daily x1 week.  Then take 2 tablets ($RemoveBe'300mg'fAsUCdrxr$ ) daily.  Dispense: 60 tablet; Refill: 0 - Patient to continue to eat smaller portions spaced throughout the day.   - Patient to continue exercise daily x30 minutes at least.  Plan to start on treadmill. - Patient to return in 1 month for annual PE.  Will consider adding Belviq at that time.  2. Grief reaction - Patient seems to be doing well. - buPROPion (WELLBUTRIN XL) 150 MG 24 hr tablet; Take 1 tablet ($RemoveB'150mg'OOPkdxzv$ ) daily x1 week.  Then take 2 tablets ($RemoveBe'300mg'kqaupuETl$ ) daily.  Dispense: 60 tablet; Refill: 0 - f/u PRN  Janora Norlander, DO PGY-2, [place]

## 2014-10-03 ENCOUNTER — Encounter: Payer: BLUE CROSS/BLUE SHIELD | Admitting: Family Medicine

## 2014-10-08 ENCOUNTER — Emergency Department (HOSPITAL_COMMUNITY)
Admission: EM | Admit: 2014-10-08 | Discharge: 2014-10-08 | Disposition: A | Discharge status: Home / Self Care | Payer: BLUE CROSS/BLUE SHIELD | Attending: Emergency Medicine | Admitting: Emergency Medicine

## 2014-10-08 ENCOUNTER — Telehealth: Payer: Self-pay | Admitting: Family Medicine

## 2014-10-08 ENCOUNTER — Encounter (HOSPITAL_COMMUNITY): Payer: Self-pay | Admitting: Emergency Medicine

## 2014-10-08 DIAGNOSIS — Z79899 Other long term (current) drug therapy: Secondary | ICD-10-CM | POA: Insufficient documentation

## 2014-10-08 DIAGNOSIS — Z8619 Personal history of other infectious and parasitic diseases: Secondary | ICD-10-CM | POA: Diagnosis not present

## 2014-10-08 DIAGNOSIS — F329 Major depressive disorder, single episode, unspecified: Secondary | ICD-10-CM | POA: Insufficient documentation

## 2014-10-08 DIAGNOSIS — F411 Generalized anxiety disorder: Secondary | ICD-10-CM | POA: Insufficient documentation

## 2014-10-08 DIAGNOSIS — I10 Essential (primary) hypertension: Secondary | ICD-10-CM | POA: Diagnosis not present

## 2014-10-08 DIAGNOSIS — R519 Headache, unspecified: Secondary | ICD-10-CM

## 2014-10-08 DIAGNOSIS — R51 Headache: Secondary | ICD-10-CM | POA: Insufficient documentation

## 2014-10-08 MED ORDER — METOCLOPRAMIDE HCL 5 MG/ML IJ SOLN
10.0000 mg | Freq: Once | INTRAMUSCULAR | Status: AC
  Administered 2014-10-08: 10 mg via INTRAVENOUS
  Filled 2014-10-08: qty 2

## 2014-10-08 MED ORDER — DIPHENHYDRAMINE HCL 50 MG/ML IJ SOLN
50.0000 mg | Freq: Once | INTRAMUSCULAR | Status: AC
  Administered 2014-10-08: 50 mg via INTRAVENOUS
  Filled 2014-10-08: qty 1

## 2014-10-08 MED ORDER — SODIUM CHLORIDE 0.9 % IV BOLUS (SEPSIS)
1000.0000 mL | Freq: Once | INTRAVENOUS | Status: AC
  Administered 2014-10-08: 1000 mL via INTRAVENOUS

## 2014-10-08 MED ORDER — DEXAMETHASONE SODIUM PHOSPHATE 10 MG/ML IJ SOLN
10.0000 mg | Freq: Once | INTRAMUSCULAR | Status: AC
  Administered 2014-10-08: 10 mg via INTRAVENOUS
  Filled 2014-10-08: qty 1

## 2014-10-08 MED ORDER — DIPHENHYDRAMINE HCL 50 MG/ML IJ SOLN
25.0000 mg | Freq: Once | INTRAMUSCULAR | Status: AC
  Administered 2014-10-08: 25 mg via INTRAVENOUS
  Filled 2014-10-08: qty 1

## 2014-10-08 NOTE — Telephone Encounter (Signed)
Return call to patient regarding blood pressure.  Patient stated she was already at the ED.  Clovis Pu, RN

## 2014-10-08 NOTE — Discharge Instructions (Signed)

## 2014-10-08 NOTE — ED Notes (Signed)
Into room, pt appears anxious and restless asking "I don't like the way it makes me feel, is it suppose to make me feel paranoid, I just want to go. I don't want any more meds". Stevie, PA notified of the same and to evaluate the patient.

## 2014-10-08 NOTE — ED Provider Notes (Signed)
CSN: [id number]     Arrival date & time 10/08/14  1538 History   First MD Initiated Contact with Patient 10/08/14 1852     Chief Complaint  Patient presents with  . Headache   HPI  Ms. Menefee is a 39 year old female with PMHx of HTN and anxiety presenting with headache. Pt states the headache came on gradually and has been constant for 4 days. The pain is located in the occipital region and "wraps around to my eyes, I feel like it's behind my eyes". The pain has been constant but has not gotten better or worse. Pain is described as throbbing. Denies exacerbating factors. Denies photophobia or phonophobia. Does not change in intensity throughout the day. She has tried advil with no relief. Denies fevers, chills, neck pain, dizziness, blurred vision, nausea, vomiting, weakness or numbness in the extremities. Denies headache history. No other members of the household are having similar headaches.   Past Medical History  Diagnosis Date  . HTN (hypertension)   . GAD (generalized anxiety disorder)   . Genital herpes   . Depression   . History of abnormal Pap smear    Past Surgical History  Procedure Laterality Date  . Colposcopy    . Tubal ligation     Family History  Problem Relation Age of Onset  . Heart attack Father   . Hypertension Father   . Heart attack Cousin 39  . Diabetes Paternal Aunt   . Hypertension Mother   . Kidney failure Sister    Social History  Substance Use Topics  . Smoking status: Never Smoker   . Smokeless tobacco: None  . Alcohol Use: No   OB History    No data available     Review of Systems  Constitutional: Negative for fever and chills.  HENT: Negative for congestion.   Eyes: Negative for photophobia and visual disturbance.  Gastrointestinal: Negative for nausea and vomiting.  Musculoskeletal: Negative for neck pain and neck stiffness.  Neurological: Positive for headaches. Negative for dizziness, syncope, weakness and numbness.      Allergies   Norvasc  Home Medications   Prior to Admission medications   Medication Sig Start Date End Date Taking? Authorizing Provider  buPROPion (WELLBUTRIN XL) 150 MG 24 hr tablet Take 1 tablet ( ) daily x1 week.  Then take 2 tablets ( ) daily. Patient taking differently: Take 300 mg by mouth daily.  08/30/14  Yes Ashly M Gottschalk, DO  hydrochlorothiazide (HYDRODIURIL) 25 MG tablet Take 1 tablet (25 mg total) by mouth daily. 08/17/14  Yes Ashly M Gottschalk, DO  ibuprofen (ADVIL,MOTRIN) 200 MG tablet Take 800 mg by mouth every 6 (six) hours as needed for headache.   Yes Historical Provider, MD  lisinopril (PRINIVIL,ZESTRIL) 10 MG tablet Take 1 tablet (10 mg total) by mouth daily. 08/17/14  Yes Ashly Hulen Skains, DO  meloxicam (MOBIC) 15 MG tablet Take 1 tablet (15 mg total) by mouth daily. Patient not taking: Reported on 06/23/2014 12/13/13   Renee A Kuneff, DO  valACYclovir (VALTREX) 500 MG tablet Take 1 tablet (500 mg total) by mouth daily. Patient not taking: Reported on 10/08/2014 08/17/14   Ashly M Gottschalk, DO   BP 124/65 mmHg  Pulse 77  Resp 18  SpO2 100%  LMP 10/06/2014 Physical Exam  Constitutional: She is oriented to person, place, and time. She appears well-developed and well-nourished. No distress.  HENT:  Head: Normocephalic and atraumatic.  Mouth/Throat: Oropharynx is clear and moist. No oropharyngeal exudate.  Eyes: Conjunctivae and EOM are normal. Pupils are equal, round, and reactive to light. Right eye exhibits no discharge. Left eye exhibits no discharge. No scleral icterus.  Neck: Normal range of motion. Neck supple.  Cardiovascular: Normal rate, regular rhythm and normal heart sounds.   Pulmonary/Chest: Effort normal and breath sounds normal. No respiratory distress. She has no wheezes. She has no rales.  Abdominal: Soft. There is no tenderness.  Musculoskeletal: Normal range of motion.  Pt moves all extremities spontaneously and walks with a steady gait   Neurological: She is alert and oriented to person, place, and time. No cranial nerve deficit. Coordination normal.  Cranial nerves 3-12 intact. Major muscle groups with 5/5 motor strength. Sensation to light touch intact. Coordinated finger to nose. Walks with a steady gait.   Skin: Skin is warm and dry.  Psychiatric: She has a normal mood and affect. Her behavior is normal.  Nursing note and vitals reviewed.   ED Course  Procedures (including critical care time) Labs Review Labs Reviewed - No data to display  Imaging Review No results found. I have personally reviewed and evaluated these images and lab results as part of my medical decision-making.   EKG Interpretation None      MDM   Final diagnoses:  Headache, unspecified headache type   Pt presenting with headache x 4 days as noted above. VSS. Pt nontoxic appearing. Non-focal neuro exam. Will treat with headache cocktail of decadron, reglan and benadryl. 8:30 - Nurse Hollice Espy notified me that pt was feeling "paranoid" after receiving headache cocktail and was asking to leave. I discussed this could be a reaction to the reglan and asked pt to stay. She agreed and was given 50 mg more benadryl. 9:30 - Pt reports resolution of "paranoid" feeling. She also reports that her headache has resolved and she is now ready to leave. Pt will schedule follow up visit with her PCP. Return precautions given in discharge paperwork and discussed with pt at bedside. Pt stable for discharge    Alveta Heimlich, PA-C 10/09/14 1541  Eber Hong, MD 10/09/14 1754

## 2014-10-08 NOTE — Telephone Encounter (Signed)
Pt called and would like to speak to a nurse. She thinks she might have high blood pressure. jw

## 2014-10-08 NOTE — ED Notes (Signed)
Pt c/o headache x4 days. No relief with OTC meds. Denies blurred or double vision. Describes pain 7/10, dull pain. Denies nausea or vomiting currently. Denies numbness or tingling.

## 2014-10-09 ENCOUNTER — Telehealth: Payer: Self-pay | Admitting: Family Medicine

## 2014-10-09 NOTE — Telephone Encounter (Signed)
Paged to [facility] Emergency Line at approx [phone] by patient, Kelsey Zavala. She reports that her Headache is persistent now for day 5, she states it was present for 3 days and she went to ED on 10/08/14 for same headache, at that time she was given HA cocktail with decadron, reglan, benadryl and it resolved in ED, her vitals were stable. No head imaging obtained at that time. She was discharged home without any headache.  Tonight she reports that the headache was resolved last night until she woke up again this morning, and has been present all day again, now day 5. Described as throbbing headache top and back of head with some radiation behind both eyes, had been constant without associated photo/phonophobia or other symptoms (no n/v, CP, SOB), did endorse one episode of transient left eye blurry vision but difficulty describing this. She denies any loss of vision. She tried ibuprofen  x 4 caps ( ) x 1 dose at 6 pm without any significant relief.  I advised her that this is difficult to evaluate over the phone and I cannot phone in any medications for her at this time. I recommended that if HA was getting worse or any new warning symptoms loss of vision, focal weakness, numbness tingling, CP, SOB, vomiting, then she should call EMS and go to ED for repeat evaluation, may need head imaging with persistent HA, although suspect known history of anxiety is contributing. I advised that she can continue taking Ibuprofen  q 6 hr overnight, and would try to get her scheduled in clinic tomorrow, only opening is SDA 2:00pm with Dr. Gayla Doss, she was notified of this apt, and asked if she could call [facility] in AM to see if any earlier openings to switch to.  [name], DO [facility], PGY-3

## 2014-10-10 ENCOUNTER — Ambulatory Visit: Payer: BLUE CROSS/BLUE SHIELD | Admitting: Family Medicine

## 2014-11-15 ENCOUNTER — Encounter: Payer: Self-pay | Admitting: Obstetrics and Gynecology

## 2014-11-15 ENCOUNTER — Ambulatory Visit (INDEPENDENT_AMBULATORY_CARE_PROVIDER_SITE_OTHER): Payer: BLUE CROSS/BLUE SHIELD | Admitting: Obstetrics and Gynecology

## 2014-11-15 VITALS — BP 125/80 | HR 88 | Temp 98.6°F

## 2014-11-15 DIAGNOSIS — G5712 Meralgia paresthetica, left lower limb: Secondary | ICD-10-CM | POA: Diagnosis not present

## 2014-11-15 MED ORDER — GABAPENTIN 300 MG PO CAPS: 300.0000 mg | ORAL_CAPSULE | Freq: Every day | ORAL | Status: DC

## 2014-11-15 NOTE — Progress Notes (Deleted)
LEG SWELLING  Having leg swelling for *** days.   No sweeling Started this morning then progressed Location: ***  Timing of swelling: ***  New medications: none History of Kidney problems: ***  History of Heart problems: ***  History of Liver problems: ***  History of cancer: ***  H/o clots or cancer: none  Symptoms  Chest pain: no Shortness of breath: no Immobility: limites Black or bloody bowel movements: ***  Severe snoring or day time sleepiness: ***  Abdomen swelling: ***  History of blood clots: no Weight Loss: ***  Patient believes may be caused by ***  No fevers No rashes

## 2014-11-15 NOTE — Patient Instructions (Signed)
Here are some of the things we discussed today: -I believe you have what is called meralgia paresthetica (please see separate handout) -Best treatment at this time is to relieve pressure from area by offloading, avoiding tight clothing from area  New medications: -Gabapentin 300mg  at night  Please schedule a follow-up appointment if not improved in the next 2 weeks.    Thanks for allowing me to be a part of your care! Dr. Doroteo GlassmanPhelps

## 2014-11-15 NOTE — Progress Notes (Signed)
   Subjective:   Patient ID: Kelsey Zavala, female    DOB: 11/11/1975, 39 y.o.   MRN: [id number]  Patient presents for Same Day Appointment  No chief complaint on file.   HPI: # Leg pain: Leg pain began this morning. It started off as one local area but has now diffused over her entire later and upper thigh Location: left upper leg No associated swelling New medications: no No history of blood clots History of cancer: no Denies being sick Patient is worried that she may have a clot or cellulitis  Symptoms Chest pain: no Shortness of breath: no Immobility: no, but it is getting more difficult to walk as there is pain History of blood clots: no Fevers: no  ROS see HPI Smoking Status noted  Objective:  BP 125/80 mmHg  Pulse 88  Temp(Src) 98.6 F (37 C) (Oral)  LMP 11/02/2014 (Exact Date) Vitals and nursing note reviewed  Physical Exam  Constitutional: She is well-developed, well-nourished, and in no distress.  Cardiovascular: Normal rate.   Pulmonary/Chest: Effort normal.  Neurological: She has normal strength. A sensory deficit is present. Gait abnormal.  Exam grossly non-focal. Abnormal sensation described as "raw" when touching anteriolateral left thigh. No swelling, rashes, or excessive warmth.   Skin: Skin is warm, dry and intact. No rash noted. No erythema.    Assessment & Plan:  See Problem List Documentation   Caryl AdaJazma Phelps, DO 11/15/2014, 1:46 PM PGY-2, [facility]

## 2014-11-16 DIAGNOSIS — G5710 Meralgia paresthetica, unspecified lower limb: Secondary | ICD-10-CM | POA: Insufficient documentation

## 2014-11-16 NOTE — Assessment & Plan Note (Signed)
A: Patient's symptoms and physical consistent with meralgia paraesthetica. She does not have the classic numbness and tingling but describes a raw sensation to the outer upper thigh area. Course is usually self-limited and a benign disease.   P: -reassured patient of the self-limited course and how there is usually spontaneous remission -discussed ways to reduce pressure over groin area such as avoiding tight paints, decrease bending.  -handout given -return precautions discussed -gabapentin prescribed for paraesthesias

## 2015-02-28 ENCOUNTER — Other Ambulatory Visit: Payer: Self-pay | Admitting: Family Medicine

## 2015-03-16 ENCOUNTER — Emergency Department (HOSPITAL_COMMUNITY)
Admission: EM | Admit: 2015-03-16 | Discharge: 2015-03-16 | Disposition: A | Discharge status: Home / Self Care | Payer: 59 | Attending: Emergency Medicine | Admitting: Emergency Medicine

## 2015-03-16 ENCOUNTER — Encounter (HOSPITAL_COMMUNITY): Payer: Self-pay | Admitting: *Deleted

## 2015-03-16 ENCOUNTER — Emergency Department (HOSPITAL_COMMUNITY): Payer: 59

## 2015-03-16 DIAGNOSIS — F411 Generalized anxiety disorder: Secondary | ICD-10-CM | POA: Diagnosis not present

## 2015-03-16 DIAGNOSIS — Z8619 Personal history of other infectious and parasitic diseases: Secondary | ICD-10-CM | POA: Diagnosis not present

## 2015-03-16 DIAGNOSIS — R42 Dizziness and giddiness: Secondary | ICD-10-CM

## 2015-03-16 DIAGNOSIS — F329 Major depressive disorder, single episode, unspecified: Secondary | ICD-10-CM | POA: Insufficient documentation

## 2015-03-16 DIAGNOSIS — I10 Essential (primary) hypertension: Secondary | ICD-10-CM | POA: Insufficient documentation

## 2015-03-16 DIAGNOSIS — H8110 Benign paroxysmal vertigo, unspecified ear: Secondary | ICD-10-CM | POA: Insufficient documentation

## 2015-03-16 DIAGNOSIS — Z3202 Encounter for pregnancy test, result negative: Secondary | ICD-10-CM | POA: Diagnosis not present

## 2015-03-16 DIAGNOSIS — Z79899 Other long term (current) drug therapy: Secondary | ICD-10-CM | POA: Diagnosis not present

## 2015-03-16 DIAGNOSIS — R079 Chest pain, unspecified: Secondary | ICD-10-CM | POA: Diagnosis not present

## 2015-03-16 LAB — CBC
HCT: 36.2 % (ref 36.0–46.0)
Hemoglobin: 12 g/dL (ref 12.0–15.0)
MCH: 29.8 pg (ref 26.0–34.0)
MCHC: 33.1 g/dL (ref 30.0–36.0)
MCV: 89.8 fL (ref 78.0–100.0)
PLATELETS: 294 10*3/uL (ref 150–400)
RBC: 4.03 MIL/uL (ref 3.87–5.11)
RDW: 14 % (ref 11.5–15.5)
WBC: 6.3 10*3/uL (ref 4.0–10.5)

## 2015-03-16 LAB — BASIC METABOLIC PANEL
Anion gap: 10 (ref 5–15)
BUN: 13 mg/dL (ref 6–20)
CHLORIDE: 106 mmol/L (ref 101–111)
CO2: 24 mmol/L (ref 22–32)
CREATININE: 0.78 mg/dL (ref 0.44–1.00)
Calcium: 8.8 mg/dL — ABNORMAL LOW (ref 8.9–10.3)
GFR calc Af Amer: >60 mL/min (ref >60)
GFR calc non Af Amer: >60 mL/min (ref >60)
GLUCOSE: 81 mg/dL (ref 65–99)
POTASSIUM: 4 mmol/L (ref 3.5–5.1)
SODIUM: 140 mmol/L (ref 135–145)

## 2015-03-16 LAB — I-STAT TROPONIN, ED
TROPONIN I, POC: 0 ng/mL (ref 0.00–0.08)
Troponin i, poc: 0 ng/mL (ref 0.00–0.08)

## 2015-03-16 LAB — POC URINE PREG, ED: Preg Test, Ur: NEGATIVE

## 2015-03-16 MED ORDER — SODIUM CHLORIDE 0.9 % IV BOLUS (SEPSIS)
500.0000 mL | Freq: Once | INTRAVENOUS | Status: AC
  Administered 2015-03-16: 500 mL via INTRAVENOUS

## 2015-03-16 NOTE — ED Notes (Signed)
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 Pt was found to be hypertensive at that time w/ a BP of 160/101 - pt states symptoms have mostly resolved at this time however she continues to experience chest discomfort.

## 2015-03-16 NOTE — ED Notes (Signed)
PT DISCHARGED. INSTRUCTIONS GIVEN. AAOX3. PT IN NO APPARENT DISTRESS OR PAIN. THE OPPORTUNITY TO ASK QUESTIONS WAS PROVIDED. 

## 2015-03-16 NOTE — Discharge Instructions (Signed)
Nonspecific Chest Pain  °Chest pain can be caused by many different conditions. There is always a chance that your pain could be related to something serious, such as a heart attack or a blood clot in your lungs. Chest pain can also be caused by conditions that are not life-threatening. If you have chest pain, it is very important to follow up with your health care provider. °CAUSES  °Chest pain can be caused by: °· Heartburn. °· Pneumonia or bronchitis. °· Anxiety or stress. °· Inflammation around your heart (pericarditis) or lung (pleuritis or pleurisy). °· A blood clot in your lung. °· A collapsed lung (pneumothorax). It can develop suddenly on its own (spontaneous pneumothorax) or from trauma to the chest. °· Shingles infection (varicella-zoster virus). °· Heart attack. °· Damage to the bones, muscles, and cartilage that make up your chest wall. This can include: °¨ Bruised bones due to injury. °¨ Strained muscles or cartilage due to frequent or repeated coughing or overwork. °¨ Fracture to one or more ribs. °¨ Sore cartilage due to inflammation (costochondritis). °RISK FACTORS  °Risk factors for chest pain may include: °· Activities that increase your risk for trauma or injury to your chest. °· Respiratory infections or conditions that cause frequent coughing. °· Medical conditions or overeating that can cause heartburn. °· Heart disease or family history of heart disease. °· Conditions or health behaviors that increase your risk of developing a blood clot. °· Having had chicken pox (varicella zoster). °SIGNS AND SYMPTOMS °Chest pain can feel like: °· Burning or tingling on the surface of your chest or deep in your chest. °· Crushing, pressure, aching, or squeezing pain. °· Dull or sharp pain that is worse when you move, cough, or take a deep breath. °· Pain that is also felt in your back, neck, shoulder, or arm, or pain that spreads to any of these areas. °Your chest pain may come and go, or it may stay  constant. °DIAGNOSIS °Lab tests or other studies may be needed to find the cause of your pain. Your health care provider may have you take a test called an ambulatory ECG (electrocardiogram). An ECG records your heartbeat patterns at the time the test is performed. You may also have other tests, such as: °· Transthoracic echocardiogram (TTE). During echocardiography, sound waves are used to create a picture of all of the heart structures and to look at how blood flows through your heart. °· Transesophageal echocardiogram (TEE). This is a more advanced imaging test that obtains images from inside your body. It allows your health care provider to see your heart in finer detail. °· Cardiac monitoring. This allows your health care provider to monitor your heart rate and rhythm in real time. °· Holter monitor. This is a portable device that records your heartbeat and can help to diagnose abnormal heartbeats. It allows your health care provider to track your heart activity for several days, if needed. °· Stress tests. These can be done through exercise or by taking medicine that makes your heart beat more quickly. °· Blood tests. °· Imaging tests. °TREATMENT  °Your treatment depends on what is causing your chest pain. Treatment may include: °· Medicines. These may include: °¨ Acid blockers for heartburn. °¨ Anti-inflammatory medicine. °¨ Pain medicine for inflammatory conditions. °¨ Antibiotic medicine, if an infection is present. °¨ Medicines to dissolve blood clots. °¨ Medicines to treat coronary artery disease. °· Supportive care for conditions that do not require medicines. This may include: °¨ Resting. °¨ Applying heat   or cold packs to injured areas. °¨ Limiting activities until pain decreases. °HOME CARE INSTRUCTIONS °· If you were prescribed an antibiotic medicine, finish it all even if you start to feel better. °· Avoid any activities that bring on chest pain. °· Do not use any tobacco products, including  cigarettes, chewing tobacco, or electronic cigarettes. If you need help quitting, ask your health care provider. °· Do not drink alcohol. °· Take medicines only as directed by your health care provider. °· Keep all follow-up visits as directed by your health care provider. This is important. This includes any further testing if your chest pain does not go away. °· If heartburn is the cause for your chest pain, you may be told to keep your head raised (elevated) while sleeping. This reduces the chance that acid will go from your stomach into your esophagus. °· Make lifestyle changes as directed by your health care provider. These may include: °¨ Getting regular exercise. Ask your health care provider to suggest some activities that are safe for you. °¨ Eating a heart-healthy diet. A registered dietitian can help you to learn healthy eating options. °¨ Maintaining a healthy weight. °¨ Managing diabetes, if necessary. °¨ Reducing stress. °SEEK MEDICAL CARE IF: °· Your chest pain does not go away after treatment. °· You have a rash with blisters on your chest. °· You have a fever. °SEEK IMMEDIATE MEDICAL CARE IF:  °· Your chest pain is worse. °· You have an increasing cough, or you cough up blood. °· You have severe abdominal pain. °· You have severe weakness. °· You faint. °· You have chills. °· You have sudden, unexplained chest discomfort. °· You have sudden, unexplained discomfort in your arms, back, neck, or jaw. °· You have shortness of breath at any time. °· You suddenly start to sweat, or your skin gets clammy. °· You feel nauseous or you vomit. °· You suddenly feel light-headed or dizzy. °· Your heart begins to beat quickly, or it feels like it is skipping beats. °These symptoms may represent a serious problem that is an emergency. Do not wait to see if the symptoms will go away. Get medical help right away. Call your local emergency services (911 in the [place] Do not drive yourself to the hospital. °  °This  information is not intended to replace advice given to you by your health care provider. Make sure you discuss any questions you have with your health care provider. °  °Document Released: 10/01/2004 Document Revised: 01/12/2014 Document Reviewed: 07/28/2013 °[employer]vier Interactive Patient Education ©2016 [employer] ° °

## 2015-03-16 NOTE — ED Provider Notes (Signed)
CSN: [id number]     Arrival date & time 03/16/15  1513 History   First MD Initiated Contact with Patient 03/16/15 1746     Chief Complaint  Patient presents with  . Chest Pain    Kelsey Zavala is a 40 y.o. female who presents to the ED complaining of sudden onset of chest tightness, lightheadedness, and feeling hot and sweaty while at work today. 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 She reports feeling like she might pass out. She reports her symptoms lasted less than 30 minutes. She reports she is still having some generalized chest tightness which he denies chest pain or shortness of breath. She denies coughing. She endorses feeling anxious currently. She has a history of hypertension. She denies personal or close family history of MI, PE or DVT. She is not a smoker. She denies fevers, coughing, hemoptysis, leg pain, leg swelling, syncope, neck pain, abdominal pain, nausea, vomiting, diarrhea, or recent long travel.  Patient is a 40 y.o. female presenting with chest pain. The history is provided by the patient. No language interpreter was used.  Chest Pain Associated symptoms: no abdominal pain, no back pain, no cough, no dysphagia, no fever, no headache, no nausea, no numbness, no palpitations, no shortness of breath, not vomiting and no weakness     Past Medical History  Diagnosis Date  . HTN (hypertension)   . GAD (generalized anxiety disorder)   . Genital herpes   . Depression   . History of abnormal Pap smear    Past Surgical History  Procedure Laterality Date  . Colposcopy    . Tubal ligation     Family History  Problem Relation Age of Onset  . Heart attack Father   . Hypertension Father   . Heart attack Cousin 39  . Diabetes Paternal Aunt   . Hypertension Mother   . Kidney failure Sister    Social History  Substance Use Topics  . Smoking status: Never Smoker   . Smokeless tobacco: None  . Alcohol Use: No   OB History    No data available     Review of Systems  Constitutional: Negative for fever and chills.  HENT: Negative for congestion, sore throat and trouble swallowing.   Eyes: Negative for visual disturbance.  Respiratory: Negative for cough, shortness of breath and wheezing.   Cardiovascular: Positive for chest pain. Negative for palpitations and leg swelling.  Gastrointestinal: Negative for nausea, vomiting, abdominal pain and diarrhea.  Genitourinary: Negative for dysuria, hematuria and difficulty urinating.  Musculoskeletal: Negative for back pain, neck pain and neck stiffness.  Skin: Negative for rash.  Neurological: Positive for light-headedness. Negative for seizures, syncope, weakness, numbness and headaches.      Allergies  Norvasc  Home Medications   Prior to Admission medications   Medication Sig Start Date End Date Taking? Authorizing Provider  hydrochlorothiazide (HYDRODIURIL) 25 MG tablet TAKE ONE TABLET BY MOUTH ONE TIME DAILY 02/28/15  Yes Ashly M Gottschalk, DO  ibuprofen (ADVIL,MOTRIN) 200 MG tablet Take 800 mg by mouth every 6 (six) hours as needed for headache, mild pain or moderate pain.    Yes Historical Provider, MD  lisinopril (PRINIVIL,ZESTRIL) 10 MG tablet TAKE ONE TABLET BY MOUTH ONE TIME DAILY 02/28/15  Yes Ashly M Gottschalk, DO  buPROPion (WELLBUTRIN XL) 150 MG 24 hr tablet Take 1 tablet ( ) daily x1 week.  Then take 2 tablets ( ) daily. Patient not taking: Reported on 03/16/2015  08/30/14   Raliegh IpAshly M Gottschalk, DO  gabapentin (NEURONTIN) 300 MG capsule Take 1 capsule (300 mg total) by mouth at bedtime. Patient not taking: Reported on 03/16/2015 11/15/14   Pincus LargeJazma Y Phelps, DO  meloxicam (MOBIC) 15 MG tablet Take 1 tablet (15 mg total) by mouth daily. Patient not taking: Reported on 06/23/2014 12/13/13   Renee A Kuneff, DO  valACYclovir (VALTREX) 500 MG tablet Take 1 tablet (500 mg total) by mouth daily. Patient not taking: Reported on 10/08/2014 08/17/14   Ashly M  Gottschalk, DO   BP 121/75 mmHg  Pulse 73  Temp(Src) 98.8 F (37.1 C) (Oral)  Resp 20  SpO2 99%  LMP 03/03/2015 (Approximate) Physical Exam  Constitutional: She is oriented to person, place, and time. She appears well-developed and well-nourished. No distress.  Nontoxic appearing.  HENT:  Head: Normocephalic and atraumatic.  Mouth/Throat: Oropharynx is clear and moist.  Eyes: Conjunctivae are normal. Pupils are equal, round, and reactive to light. Right eye exhibits no discharge. Left eye exhibits no discharge.  Neck: Normal range of motion. Neck supple. No JVD present. No tracheal deviation present.  Cardiovascular: Normal rate, regular rhythm, normal heart sounds and intact distal pulses.  Exam reveals no gallop and no friction rub.   No murmur heard. Bilateral radial and posterior tibialis pulses are intact.  Pulmonary/Chest: Effort normal and breath sounds normal. No respiratory distress. She has no wheezes. She has no rales. She exhibits no tenderness.  Lungs are clear to auscultation bilaterally. No chest wall tenderness to palpation.  Abdominal: Soft. Bowel sounds are normal. She exhibits no distension. There is no tenderness. There is no guarding.  Musculoskeletal: She exhibits no edema or tenderness.  No lower extremity edema or tenderness.  Lymphadenopathy:    She has no cervical adenopathy.  Neurological: She is alert and oriented to person, place, and time. No cranial nerve deficit. Coordination normal.  Patient is alert and oriented 3. Speech is clear and coherent..  Skin: Skin is warm and dry. No rash noted. She is not diaphoretic. No erythema. No pallor.  Psychiatric: She has a normal mood and affect. Her behavior is normal.  Nursing note and vitals reviewed.   ED Course  Procedures (including critical care time) Labs Review Labs Reviewed  BASIC METABOLIC PANEL - Abnormal; Notable for the following:    Calcium 8.8 (*)    All other components within normal  limits  CBC  I-STAT TROPOININ, ED  POC URINE PREG, ED  Rosezena SensorI-STAT TROPOININ, ED    Imaging Review Dg Chest 2 View  03/16/2015  CLINICAL DATA:  Chest pain, dizziness. EXAM: CHEST  2 VIEW COMPARISON:  03/05/2014 FINDINGS: The heart size and mediastinal contours are within normal limits. Both lungs are clear. The visualized skeletal structures are unremarkable. IMPRESSION: No active cardiopulmonary disease. Electronically Signed   By: Charlett NoseKevin  Dover M.D.   On: 03/16/2015 16:18   I have personally reviewed and evaluated these images and lab results as part of my medical decision-making.   EKG Interpretation   Date/Time:  Saturday March 16 2015 15:59:11 EST Ventricular Rate:  90 PR Interval:  160 QRS Duration: 69 QT Interval:  337 QTC Calculation: 412 R Axis:   51 Text Interpretation:  Sinus rhythm Low voltage, precordial leads Abnormal  R-wave progression, early transition Borderline T abnormalities, anterior  leads No significant change since last tracing Confirmed by Anitra LauthPLUNKETT  MD,  Alphonzo LemmingsWHITNEY ([id number]) on 03/16/2015 6:34:37 PM      Filed Vitals:   03/16/15  1910 03/16/15 1912 03/16/15 1915 03/16/15 2125  BP: 114/63 110/71 96/78 121/75  Pulse: 88 84 85 73  Temp:      TempSrc:      Resp: SpO2: 100% 100% 100% 99%     MDM   Final diagnoses:  Chest pain, unspecified chest pain type  Positional lightheadedness   This is a 40 y.o. female who presents to the ED complaining of sudden onset of chest tightness, lightheadedness, and feeling hot and sweaty while at work today. 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 She reports feeling like she might pass out. She reports her symptoms lasted less than 30 minutes. She reports she is still having some generalized chest tightness which he denies chest pain or shortness of breath. She denies coughing. She endorses feeling anxious currently. Patient presented with chest pain to the ED. Patient is  to be discharged with recommendation to follow up with PCP in regards to today's hospital visit. Chest pain is not likely of cardiac or pulmonary etiology due to presentation, perc negative, VSS, no tracheal deviation, no JVD or new murmur, RRR, breath sounds equal bilaterally, EKG without acute abnormalities, negative troponin and negative delta troponin and negative CXR. HEART score is 3. Prior to discharge patient denies any chest pain. She denies feeling lightheaded or dizzy with position change. Patient has been advised to return to the ED if chest pain becomes exertional, associated with diaphoresis or nausea, radiates to left jaw/arm, worsens or becomes concerning in any way. Patient appears reliable for follow up and is agreeable to discharge. I advised the patient to follow-up with their primary care provider this week. I advised the patient to return to the emergency department with new or worsening symptoms or new concerns. The patient verbalized understanding and agreement with plan.    Everlene Farrier, PA-C 03/17/15 1610  Gwyneth Sprout, MD 03/17/15 1729

## 2015-04-04 ENCOUNTER — Ambulatory Visit (INDEPENDENT_AMBULATORY_CARE_PROVIDER_SITE_OTHER): Payer: 59 | Admitting: Family Medicine

## 2015-04-04 ENCOUNTER — Other Ambulatory Visit (HOSPITAL_COMMUNITY)
Admission: RE | Admit: 2015-04-04 | Discharge: 2015-04-04 | Disposition: A | Discharge status: Home / Self Care | Payer: 59 | Source: Ambulatory Visit | Attending: Family Medicine | Admitting: Family Medicine

## 2015-04-04 ENCOUNTER — Encounter: Payer: Self-pay | Admitting: Family Medicine

## 2015-04-04 VITALS — BP 140/81 | HR 71 | Temp 98.7°F | Ht 68.0 in | Wt 308.6 lb

## 2015-04-04 DIAGNOSIS — N9489 Other specified conditions associated with female genital organs and menstrual cycle: Secondary | ICD-10-CM

## 2015-04-04 DIAGNOSIS — N898 Other specified noninflammatory disorders of vagina: Secondary | ICD-10-CM

## 2015-04-04 DIAGNOSIS — R102 Pelvic and perineal pain: Secondary | ICD-10-CM

## 2015-04-04 DIAGNOSIS — Z113 Encounter for screening for infections with a predominantly sexual mode of transmission: Secondary | ICD-10-CM | POA: Insufficient documentation

## 2015-04-04 DIAGNOSIS — Z202 Contact with and (suspected) exposure to infections with a predominantly sexual mode of transmission: Secondary | ICD-10-CM

## 2015-04-04 DIAGNOSIS — R87619 Unspecified abnormal cytological findings in specimens from cervix uteri: Secondary | ICD-10-CM | POA: Insufficient documentation

## 2015-04-04 LAB — POCT WET PREP (WET MOUNT): CLUE CELLS WET PREP WHIFF POC: NEGATIVE

## 2015-04-04 LAB — POCT URINALYSIS DIPSTICK
Bilirubin, UA: NEGATIVE
Glucose, UA: NEGATIVE
Ketones, UA: NEGATIVE
Leukocytes, UA: NEGATIVE
NITRITE UA: NEGATIVE
PH UA: 6.5
Protein, UA: NEGATIVE
RBC UA: NEGATIVE
Spec Grav, UA: 1.025
UROBILINOGEN UA: 0.2

## 2015-04-04 NOTE — Patient Instructions (Signed)
Thank you for coming to see me today. It was a pleasure. Today we talked about:   Physical: I hope your bariatric surgery goals go well. Regarding your pelvic pain, I will check to make sure you do not have an infection. If negative, I will get an ultrasound of your pelvic area.  Please make an appointment to see Dr. Nadine CountsGottschalk for follow-up.  If you have any questions or concerns, please do not hesitate to call the office at [phone].  Sincerely,  Jacquelin Hawkingalph Pretty Weltman, MD

## 2015-04-04 NOTE — Progress Notes (Signed)
Subjective    Kelsey Zavala is a 40 y.o. female that presents for yearly physical exam.   Concerns:  1. Pelvic pressure/pain: Symptoms started about one month ago. She describes it as a cramping feeling. Pain worse when standing from a seated position. No fevers, nausea, or vomiting. She reports some abnormal vaginal discharge with some odor. She has tried cranberry juice which has not helped.  Goals    None      No obstetric history on file. Wt Readings from Last 3 Encounters:  04/04/15 308 lb 9.6 oz (139.98 kg)  08/30/14 305 lb 9.6 oz (138.619 kg)  08/13/14 308 lb 12.8 oz (140.071 kg)   Last period: 03/23/2015 Regular periods: Yes Heavy bleeding: No  Sexually active: Yes, men only. One partner in the last year Birth control or hormonal therapy:  Hx of STD: Patient desires STD screening Dyspareunia: No Hot flashes: No Vaginal discharge: Yes Dysuria: No  Last mammogram: N/a Breast mass or concerns: No Last Pap: 2015  History of abnormal pap: Yes   FH of breast, uterine, ovarian, colon cancer: No  Past Medical History  Diagnosis Date  . HTN (hypertension)   . GAD (generalized anxiety disorder)   . Genital herpes   . Depression   . History of abnormal Pap smear     Past Surgical History  Procedure Laterality Date  . Colposcopy    . Tubal ligation      Current Outpatient Prescriptions on File Prior to Visit  Medication Sig Dispense Refill  . buPROPion (WELLBUTRIN XL) 150 MG 24 hr tablet Take 1 tablet ( ) daily x1 week.  Then take 2 tablets ( ) daily. (Patient not taking: Reported on 03/16/2015) 60 tablet 0  . gabapentin (NEURONTIN) 300 MG capsule Take 1 capsule (300 mg total) by mouth at bedtime. (Patient not taking: Reported on 03/16/2015) 30 capsule 0  . hydrochlorothiazide (HYDRODIURIL) 25 MG tablet TAKE ONE TABLET BY MOUTH ONE TIME DAILY 30 tablet 2  . ibuprofen (ADVIL,MOTRIN) 200 MG tablet Take 800 mg by mouth every 6 (six) hours as needed for  headache, mild pain or moderate pain.     Marland Kitchen lisinopril (PRINIVIL,ZESTRIL) 10 MG tablet TAKE ONE TABLET BY MOUTH ONE TIME DAILY 90 tablet 2  . meloxicam (MOBIC) 15 MG tablet Take 1 tablet (15 mg total) by mouth daily. (Patient not taking: Reported on 06/23/2014) 30 tablet 2  . valACYclovir (VALTREX) 500 MG tablet Take 1 tablet (500 mg total) by mouth daily. (Patient not taking: Reported on 10/08/2014) 90 tablet 1  . [DISCONTINUED] citalopram (CELEXA) 20 MG tablet Take 1 tablet (20 mg total) by mouth daily. 30 tablet 3   No current facility-administered medications on file prior to visit.    Allergies  Allergen Reactions  . Norvasc [Amlodipine Besylate] Swelling    Social History   Social History  . Marital Status: Married    Spouse Name: N/A  . Number of Children: N/A  . Years of Education: N/A   Social History Main Topics  . Smoking status: Never Smoker   . Smokeless tobacco: None  . Alcohol Use: No  . Drug Use: No  . Sexual Activity: Yes    Birth Control/ Protection: Surgical   Other Topics Concern  . None   Social History Narrative   Live with three children and husband.  No EtOH or tobacco use.  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     Family History  Problem Relation Age of Onset  .  Heart attack Father   . Hypertension Father   . Heart attack Cousin 39  . Diabetes Paternal Aunt   . Hypertension Mother   . Kidney failure Sister     ROS  Per HPI   Objective   BP 140/81 mmHg  Pulse 71  Temp(Src) 98.7 F (37.1 C) (Oral)  Ht 5\' 8"  (1.727 m)  Wt 308 lb 9.6 oz (139.98 kg)  BMI 46.93 kg/m2  LMP 03/23/2015  General: Well appearing HEENT:   Head:  Normocephalic  Eyes: Pupils equal and reactive to light/accomodation. Extraocular movements intact bilaterally.  Ears: Tympanic membranes normal bilaterally.  Nose/Throat: Nares patent bilaterally. Oropharnx clear and moist.  Neck: No cervical adenopathy bilaterally Respiratory/Chest: Clear to auscultation bilaterally.  Unlabored work of breathing. No wheezing or rales. Cardiovascular: Regular rate and rhythm. Normal S1 and S2. No heart murmurs present. No extra heart sounds Gastrointestinal: Soft, non-tender, non-distended, no guarding, no rebound, no masses felt Genitourinary: Normal appearing cervix. White discharge noted. No bleeding. No CMT Musculoskeletal: Normal bulk Neuro: Alert, oriented, reflexes symmetrical Dermatologic: No obvious rashes Psychiatric: Full affect  Assessment and Plan    40 y.o. female for well exam  No orders of the defined types were placed in this encounter.    Health Maintenance Due  Topic Date Due  . TETANUS/TDAP  05/06/2014    Labs:  Orders Placed This Encounter  Procedures  . HIV antibody  . RPR  . POCT Wet Prep [employer]([place] [place])  . POCT urinalysis dipstick   1. Vaginal discharge No evidence of BV, yeast or trichomonas infection - POCT Wet Prep [facility]([place] [place]) - Cervicovaginal ancillary only  2. Pelvic pressure in female Will obtain gonorrhea/chlamydia. Possible chronic pelvic pain since this is prolonged. Will obtain ultrasound if gonorrhea/chlamydia negative. - POCT urinalysis dipstick  3. Possible exposure to STD - HIV antibody - RPR

## 2015-04-05 LAB — CERVICOVAGINAL ANCILLARY ONLY
CHLAMYDIA, DNA PROBE: NEGATIVE
NEISSERIA GONORRHEA: NEGATIVE

## 2015-04-05 LAB — RPR

## 2015-04-05 LAB — HIV ANTIBODY (ROUTINE TESTING W REFLEX): HIV 1&2 Ab, 4th Generation: NONREACTIVE

## 2015-04-05 LAB — TSH: TSH: 0.78 m[IU]/L

## 2015-04-08 ENCOUNTER — Telehealth: Payer: Self-pay | Admitting: Family Medicine

## 2015-04-08 DIAGNOSIS — N760 Acute vaginitis: Principal | ICD-10-CM

## 2015-04-08 DIAGNOSIS — B9689 Other specified bacterial agents as the cause of diseases classified elsewhere: Secondary | ICD-10-CM

## 2015-04-08 MED ORDER — METRONIDAZOLE 500 MG PO TABS
500.0000 mg | ORAL_TABLET | Freq: Two times a day (BID) | ORAL | Status: DC
Start: 2015-04-08 — End: 2015-07-17

## 2015-04-08 NOTE — Telephone Encounter (Signed)
Physician Telephone Note  Reason for call: Results  Discussion with patient: Discussed diagnosis of BV. Discussed other negative labs, including HIV, RPR, Gonorrhea and chlamydia. No questions.  Meds ordered this encounter  Medications  . metroNIDAZOLE (FLAGYL) 500 MG tablet    Sig: Take 1 tablet (500 mg total) by mouth 2 (two) times daily.    Dispense:  14 tablet    Refill:  0    Jacquelin Hawkingalph Fenton Candee, MD PGY-3, [facility] 04/08/2015, 1:31 PM

## 2015-04-17 ENCOUNTER — Ambulatory Visit (INDEPENDENT_AMBULATORY_CARE_PROVIDER_SITE_OTHER): Payer: 59 | Admitting: Family Medicine

## 2015-04-17 ENCOUNTER — Encounter: Payer: Self-pay | Admitting: Family Medicine

## 2015-04-17 DIAGNOSIS — Z6841 Body Mass Index (BMI) 40.0 and over, adult: Secondary | ICD-10-CM

## 2015-04-17 DIAGNOSIS — I10 Essential (primary) hypertension: Secondary | ICD-10-CM | POA: Diagnosis not present

## 2015-04-17 NOTE — Patient Instructions (Signed)
You referral for bariatric surgery has been placed.  I have placed future labs for you to come in and have done.  You should not eat or drink anything but water or black coffee for 8 hours before having labs done.  I will contact you will the results of your labs.  If anything is abnormal, I will call you.  Otherwise, expect a copy to be mailed to you.  Follow up with me

## 2015-04-17 NOTE — Progress Notes (Signed)
    Subjective: ZO:XWRUEACC:weight loss surgery HPI: Kelsey Zavala is a 40 y.o. female presenting to clinic today for office visit. Concerns today include:  1. Weight  She notes that Wellbutrin caused horrible headaches so she discontinued.  She reports multiple failed attempts at diet and exercise.  She is not interested in pursuing weight loss medications anymore.  She reports feeling fed up with her weight.  She has gone to 2 different weight seminars, [facility]  She would like to pursue [facility]ric [facility].  2. Hypertension Blood pressure at home: 130/80s Blood pressure today: 136/84 Meds: Compliant with HCTZ, lisinopril Side effects: none ROS: Denies headache, dizziness, visual changes, nausea, vomiting, chest pain, abdominal pain or shortness of breath.  Social History Reviewed: former smoker. FamHx and MedHx reviewed.  Please see EMR. Health Maintenance: repeat PAP. Had abnormal pap in past.  ROS: Per HPI  Objective: Office vital signs reviewed. BP 136/84 mmHg  Pulse 105  Temp(Src) 98.5 F (36.9 C) (Oral)  Ht 5\' 8"  (1.727 m)  Wt 310 lb 8 oz (140.842 kg)  BMI 47.22 kg/m2  LMP 03/23/2015 (Exact Date)  Physical Examination:  General: Awake, alert, morbidly obese, No acute distress HEENT: Normal, sclera white, mmm Cardio: regular rate, no LE edema Pulm: normal WOB on room air MSK: Normal gait and station Psych: mood stable, affect appropriate, speech normal  Assessment/ Plan: 40 y.o. female   1. Morbid obesity, unspecified obesity type (HCC).  Agree that patient would benefit from bariatric surgery consultation. - Ambulatory referral to Plastic Surgery, Dr Clent RidgesWalsh (patient preference) has been placed.  2. BMI 45.0-49.9, adult [facility](HCC) - Ambulatory referral to Plastic Surgery - CBC; Future - COMPLETE METABOLIC PANEL WITH GFR; Future - Lipid panel; Future  3. Essential hypertension, benign, controlled - Continue current  regimen - COMPLETE METABOLIC PANEL WITH GFR; Future - Lipid panel; Future  Follow up in 4 weeks for pap smear.  H/o abnormal smear in past.  Kelsey IpAshly M Casha Estupinan, DO PGY-2, [facility]  Total time spent with patient 27 minutes.  Greater than 50% of encounter spent in coordination of care/counseling.

## 2015-05-01 DIAGNOSIS — I10 Essential (primary) hypertension: Secondary | ICD-10-CM | POA: Insufficient documentation

## 2015-05-16 ENCOUNTER — Encounter: Payer: Self-pay | Admitting: Family Medicine

## 2015-05-20 ENCOUNTER — Other Ambulatory Visit: Payer: Self-pay | Admitting: Family Medicine

## 2015-05-21 NOTE — Telephone Encounter (Signed)
If requiring refill, patient needs to be seen.  30 tablets should last for several outbreaks.  She likely will need to be started on prophylaxis if she is having >6 outbreaks per year.  Will send in short supply.  Have pt schedule appt.

## 2015-05-21 NOTE — Telephone Encounter (Signed)
This is your patient, see med refill 

## 2015-05-21 NOTE — Telephone Encounter (Signed)
LVM for pt to call back to inform her of below, I know she has upcoming appointment on 6/12 so wanted to just let her know that she can discuss this at this visit unless she needs to be seen earlier. Lamonte SakaiZimmerman Rumple, Courtne Lighty D, [place]

## 2015-06-10 ENCOUNTER — Other Ambulatory Visit: Payer: Self-pay | Admitting: Family Medicine

## 2015-06-11 ENCOUNTER — Other Ambulatory Visit (HOSPITAL_COMMUNITY)
Admission: RE | Admit: 2015-06-11 | Discharge: 2015-06-11 | Disposition: A | Discharge status: Home / Self Care | Payer: 59 | Source: Ambulatory Visit | Attending: Family Medicine | Admitting: Family Medicine

## 2015-06-11 ENCOUNTER — Ambulatory Visit (INDEPENDENT_AMBULATORY_CARE_PROVIDER_SITE_OTHER): Payer: 59 | Admitting: Internal Medicine

## 2015-06-11 ENCOUNTER — Encounter: Payer: Self-pay | Admitting: Internal Medicine

## 2015-06-11 VITALS — BP 142/84 | HR 80 | Temp 98.6°F | Wt 303.6 lb

## 2015-06-11 DIAGNOSIS — N760 Acute vaginitis: Secondary | ICD-10-CM | POA: Insufficient documentation

## 2015-06-11 DIAGNOSIS — R300 Dysuria: Secondary | ICD-10-CM

## 2015-06-11 DIAGNOSIS — Z113 Encounter for screening for infections with a predominantly sexual mode of transmission: Secondary | ICD-10-CM | POA: Insufficient documentation

## 2015-06-11 DIAGNOSIS — B373 Candidiasis of vulva and vagina: Secondary | ICD-10-CM

## 2015-06-11 DIAGNOSIS — B3731 Acute candidiasis of vulva and vagina: Secondary | ICD-10-CM

## 2015-06-11 LAB — POCT URINALYSIS DIPSTICK
Glucose, UA: NEGATIVE
Ketones, UA: NEGATIVE
NITRITE UA: NEGATIVE
RBC UA: NEGATIVE
Spec Grav, UA: 1.025
Urobilinogen, UA: 0.2
pH, UA: 6

## 2015-06-11 LAB — POCT WET PREP (WET MOUNT): Clue Cells Wet Prep Whiff POC: NEGATIVE

## 2015-06-11 MED ORDER — FLUCONAZOLE 150 MG PO TABS: ORAL_TABLET | ORAL | Status: DC

## 2015-06-11 MED ORDER — VALACYCLOVIR HCL 500 MG PO TABS: 500.0000 mg | ORAL_TABLET | Freq: Two times a day (BID) | ORAL | Status: DC

## 2015-06-11 NOTE — Patient Instructions (Signed)
I have provided 3 dose of diflucan. Take one tablet, wait 3 days. If no improvement can take another tablet. I will let you know the results of labs.

## 2015-06-11 NOTE — Progress Notes (Signed)
   [facility] Noralee CharsAsiyah Arun Herrod, MD Phone: [phone]  Reason For Visit: Yeast infection   Per patient last week she was have dysuria. Patient had "left over" keflex. She took this medication and her dysuria resolved. However 3 days ago she started having thick milky discharge. She also vaginal itching. Denies any vaginal pain or tenderness. She denies pelvic pain or fever/chills. She is sexual active with her husband only. Does not use  Condoms. She does have hx of herpes and denies any recent outbreaks. Indicates having 1 lesion, that she just noticed yesterday. Would like a refill on her valacyclovir. Denies pelvic pain, dysuria, changes in frequency of urination. Tubaligation in 2005, no other from of contraception since then.   Past Medical History Reviewed problem list.  Medications- reviewed and updated No additions to family history  Objective: BP 142/84 mmHg  Pulse 80  Temp(Src) 98.6 F (37 C) (Oral)  Wt 303 lb 9.6 oz (137.712 kg)  LMP 05/19/2015 Gen: NAD, alert, cooperative with exam Abdomen: no suprapubic pain Pelvic Exam: Small skin lesion on left side of the the mons pubis consistent with skin rxn to shaving, otherwise no lesions noted along outside of the labia major or labia minora. Vaginal wall with copious thick white curd discharge. No discharge from cervical os. Cervix wnl. No cervical motion tenderness   Assessment/Plan: See problem based a/p  Vaginal yeast infection Patient with thick white curd like discharge following exposure antibiotics last week.  No cervical motion tenderness. No pus like discharge from cervical os. No systemic symptoms. Sexual active with husband.  - Will get wet prep, G/C probe  - Will provide patient wit diflucan x 3 pills, in past patient has needed multiple course to clear yeast infection - Refilled valacyclovir as patient with hx of genital herpes, lesion on mons pubis consistent with shaving reaction, no numbness  or tingling per patient associated with it  -Hx of dysuria last week, therefore will get UA. If any signs of infection will follow up with urine culture  - follow up as needed pending improvement

## 2015-06-11 NOTE — Assessment & Plan Note (Signed)
Patient with thick white curd like discharge following exposure antibiotics last week.  No cervical motion tenderness. No pus like discharge from cervical os. No systemic symptoms. Sexual active with husband.  - Will get wet prep, G/C probe  - Will provide patient wit diflucan x 3 pills, in past patient has needed multiple course to clear yeast infection - Refilled valacyclovir as patient with hx of genital herpes, lesion on mons pubis consistent with shaving reaction, no numbness or tingling per patient associated with it  -Hx of dysuria last week, therefore will get UA. If any signs of infection will follow up with urine culture  - follow up as needed pending improvement

## 2015-06-12 LAB — CERVICOVAGINAL ANCILLARY ONLY
Chlamydia: NEGATIVE
NEISSERIA GONORRHEA: NEGATIVE
Trichomonas: NEGATIVE

## 2015-06-13 ENCOUNTER — Telehealth: Payer: Self-pay | Admitting: Internal Medicine

## 2015-06-13 NOTE — Telephone Encounter (Signed)
Provided results to the patient. Patient stated she improved with diflucan. No other as issues.

## 2015-06-14 LAB — CERVICOVAGINAL ANCILLARY ONLY
BACTERIAL VAGINITIS: NEGATIVE
CANDIDA VAGINITIS: POSITIVE — AB

## 2015-06-17 ENCOUNTER — Ambulatory Visit: Payer: 59 | Admitting: Family Medicine

## 2015-06-20 ENCOUNTER — Encounter: Payer: Self-pay | Admitting: Family Medicine

## 2015-06-20 ENCOUNTER — Ambulatory Visit (INDEPENDENT_AMBULATORY_CARE_PROVIDER_SITE_OTHER): Payer: 59 | Admitting: Family Medicine

## 2015-06-20 VITALS — BP 136/75 | HR 76 | Temp 98.7°F

## 2015-06-20 DIAGNOSIS — L299 Pruritus, unspecified: Secondary | ICD-10-CM | POA: Diagnosis not present

## 2015-06-20 MED ORDER — HYDROCORTISONE 1 % EX OINT: 1.0000 "application " | TOPICAL_OINTMENT | Freq: Two times a day (BID) | CUTANEOUS | Status: DC | PRN

## 2015-06-20 MED ORDER — HYDROXYZINE PAMOATE 25 MG PO CAPS: 25.0000 mg | ORAL_CAPSULE | Freq: Three times a day (TID) | ORAL | Status: DC | PRN

## 2015-06-20 NOTE — Progress Notes (Signed)
   Subjective:    Patient ID: Kelsey Zavala, female    DOB: 12/11/1975, 40 y.o.   MRN: [id number]  Seen for Same day visit for   CC: itching  She comes in today with concerns for ringworm.  She reports watching her nieces and nephews 4 days ago her recently diagnosed with ringworm.  Since then, she reports excessive itching in her hair back of her neck, chest, arms, and right thigh.  She reports circular lesions on her chest and thigh 2 days ago that resolved.  She has been using Benadryl cream, tea tree oil, witch hazel and topical Lotrimin.  She is concerned that she has ringworm and would like treatment for this.  Denies fever or chills.  Denies history of eczema or psoriasis.   Review of Systems   See HPI for ROS. Objective:  BP 136/75 mmHg  Pulse 76  Temp(Src) 98.7 F (37.1 C) (Oral)  SpO2 100%  LMP 05/19/2015  General: NAD Cardiac: RRR, normal heart sounds, no murmurs. 2+ radial and PT pulses bilaterally Respiratory: CTAB, normal effort Skin:Diffuse dry skin with superficial excoriations on posterior neck and right thigh with some hyperpigmentation changes.  No circular or erythematous lesions noted.  Negative wood's lamp evaluation    Assessment & Plan:   Itching Diffuse itching on neck, chest, back and scalp without discrete circular lesions.  Doubt ringworm even with recent exposure.  Most likely psychosomatic itching versus eczema - We'll treat with topical hydrocortisone and oral Vistaril when necessary - Advised to follow-up if itching persists after 1-2 weeks of treatment or sooner if she develops discrete circular lesions

## 2015-06-20 NOTE — Patient Instructions (Addendum)
Use Vistaril 25 mg up to 3 times a day as needed for itching.  You can also apply hydrocortisone cream to itchy areas twice a day as needed.  - Return if itching persist after 1-2 weeks or if you develop any new raised circular lesions concerning for ringworm - He can also try once a day, Claritin, Allegra or Zyrtec for itching

## 2015-06-20 NOTE — Assessment & Plan Note (Signed)
Diffuse itching on neck, chest, back and scalp without discrete circular lesions.  Doubt ringworm even with recent exposure.  Most likely psychosomatic itching versus eczema - We'll treat with topical hydrocortisone and oral Vistaril when necessary - Advised to follow-up if itching persists after 1-2 weeks of treatment or sooner if she develops discrete circular lesions

## 2015-06-21 ENCOUNTER — Ambulatory Visit: Payer: 59 | Admitting: Family Medicine

## 2015-07-11 ENCOUNTER — Other Ambulatory Visit: Payer: Self-pay | Admitting: *Deleted

## 2015-07-11 MED ORDER — VALACYCLOVIR HCL 500 MG PO TABS: 500.0000 mg | ORAL_TABLET | Freq: Every day | ORAL | Status: DC

## 2015-07-11 MED ORDER — VALACYCLOVIR HCL 500 MG PO TABS: ORAL_TABLET | ORAL | Status: DC

## 2015-07-11 NOTE — Telephone Encounter (Signed)
Will provide suppressive therapy to patient was on previously as requested. Per office visit I understood patient to desire a prescription for possibly future outbreaks. However suppressive therapy seem reasonable as patient has been using this previously.

## 2015-07-11 NOTE — Telephone Encounter (Signed)
Pt states that she would like for a nurse to call her back about a med refill.  When I attempted to get more info, she states "it is personal and Im not going to tell you that, I need the nurse to call me back" Fleeger, Maryjo RochesterJessica Dawn, CMA

## 2015-07-11 NOTE — Telephone Encounter (Signed)
Return call to patient regarding need to speak with nurse.  Patient stated she was given a 90 day supply of valACYclovir (VALTREX) 500 MG tablet to take daily.  Last Rx sent in was changed to only when she has an outbreak.  Patient is having an outbreak now, which started today.  Patient prefers to be back on valtrex as a preventative medication (daily).  Please advise.  Clovis PuMartin, Tamika L, RN

## 2015-07-11 NOTE — Telephone Encounter (Signed)
It appears that this refill was per Dr Cathlean CowerMikell.  I am not sure why the change.  I'm cc'ing her on this, but yes, it appears that she was on suppressive therapy previously.

## 2015-07-17 ENCOUNTER — Encounter: Payer: Self-pay | Admitting: Family Medicine

## 2015-07-17 ENCOUNTER — Other Ambulatory Visit (HOSPITAL_COMMUNITY)
Admission: RE | Admit: 2015-07-17 | Discharge: 2015-07-17 | Disposition: A | Discharge status: Home / Self Care | Payer: 59 | Source: Ambulatory Visit | Attending: Family Medicine | Admitting: Family Medicine

## 2015-07-17 ENCOUNTER — Ambulatory Visit (INDEPENDENT_AMBULATORY_CARE_PROVIDER_SITE_OTHER): Payer: 59 | Admitting: Family Medicine

## 2015-07-17 VITALS — BP 130/71 | HR 82 | Temp 98.5°F | Wt 302.0 lb

## 2015-07-17 DIAGNOSIS — A6000 Herpesviral infection of urogenital system, unspecified: Secondary | ICD-10-CM | POA: Diagnosis not present

## 2015-07-17 DIAGNOSIS — Z124 Encounter for screening for malignant neoplasm of cervix: Secondary | ICD-10-CM

## 2015-07-17 DIAGNOSIS — Z01419 Encounter for gynecological examination (general) (routine) without abnormal findings: Secondary | ICD-10-CM

## 2015-07-17 DIAGNOSIS — Z1151 Encounter for screening for human papillomavirus (HPV): Secondary | ICD-10-CM | POA: Insufficient documentation

## 2015-07-17 DIAGNOSIS — Z113 Encounter for screening for infections with a predominantly sexual mode of transmission: Secondary | ICD-10-CM | POA: Insufficient documentation

## 2015-07-17 DIAGNOSIS — N898 Other specified noninflammatory disorders of vagina: Secondary | ICD-10-CM | POA: Diagnosis not present

## 2015-07-17 LAB — POCT WET PREP (WET MOUNT): CLUE CELLS WET PREP WHIFF POC: NEGATIVE

## 2015-07-17 LAB — LIPID PANEL
CHOLESTEROL: 172 mg/dL (ref 125–200)
HDL: 48 mg/dL (ref >=46)
LDL Cholesterol: 107 mg/dL (ref <130)
Total CHOL/HDL Ratio: 3.6 Ratio (ref <=5.0)
Triglycerides: 87 mg/dL (ref <150)
VLDL: 17 mg/dL (ref <30)

## 2015-07-17 MED ORDER — HYDROCHLOROTHIAZIDE 25 MG PO TABS: 25.0000 mg | ORAL_TABLET | Freq: Every day | ORAL | Status: DC

## 2015-07-17 MED ORDER — LISINOPRIL 10 MG PO TABS: 10.0000 mg | ORAL_TABLET | Freq: Every day | ORAL | Status: DC

## 2015-07-17 NOTE — Patient Instructions (Addendum)
I will contact you will the results of your labs.  If anything is abnormal, I will call you.  Otherwise, expect a copy to be mailed to you.  Healthy vaginal hygiene practices   -  Avoid sleeper pajamas. Nightgowns allow air to circulate.  Sleep without underpants whenever possible.  -  Wear cotton underpants during the day. Double-rinse underwear after washing to avoid residual irritants. Do not use fabric softeners for underwear and swimsuits.  - Avoid tights, leotards, leggings, "skinny" jeans, and other tight-fitting clothing. Skirts and loose-fitting pants allow air to circulate.  - Avoid pantyliners.  Instead use tampons or cotton pads.  - Daily warm bathing is helpful:     - Soak in clean water (no soap) for 10 to 15 minutes. Adding vinegar or baking soda to the water has not been specifically studied and may not be better than clean water alone.      - Use soap to wash regions other than the genital area just before getting out of the tub. Limit use of any soap on genital areas. Use fragance-free soaps.     - Rinse the genital area well and gently pat dry.  Don't rub.  Hair dryer to assist with drying can be used only if on cool setting.     - Do not use bubble baths or perfumed soaps.  - Do not use any feminine sprays, douches or powders.  These contain chemicals that will irritate the skin.  - If the genital area is tender or swollen, cool compresses may relieve the discomfort. Unscented wet wipes can be used instead of toilet paper for wiping.   - Emollients, such as Vaseline, may help protect skin and can be applied to the irritated area.  - Always remember to wipe front-to-back after bowel movements. Pat dry after urination.  - Do not sit in wet swimsuits for long periods of time after swimming

## 2015-07-17 NOTE — Progress Notes (Signed)
Kelsey Zavala is a 40 y.o. female presents to office today for annual physical exam examination.  Concerns today include:  1. Weight loss She notes that she is 2 appointments away from bariatric surgery decision.  She should know by Sept.  90 grams of protein daily. 120 ounces of water daily.  Walks 30 minutes Tues w/ walking group. Thursday water aerobics.  She is noticing increased ROM/ flexibility.  Seeing [facility] for bariatric intervention.  2. Vaginal Discharge  Patient reports that discharge started 2 days ago.  She notes that discharge appears milky.  She mild nonfishy vaginal odors.  She denies vaginal pruritis, abnormal vaginal bleeding, dysuria, hematuria, pelvic pain, nausea, vomiting, fevers.   She notes that she has a uterine heaviness/ pressure like pain that is not associated with menstrual cycle.  She has used Motrin with some relief.   Has h/o HSV2 but no other history of STIs.  She is sexually active and does use condoms.  Contraception: BTL.  Patient's last menstrual period was 07/17/2015 (exact date).  Last eye exam: 02/2014 Last dental exam: 08/2015 Last pap smear: 09/2013; ASCUS HPV neg Immunizations needed: TDap Refills needed today: Valtrex, HCTZ, Lisinopril  Past Medical History  Diagnosis Date  . HTN (hypertension)   . GAD (generalized anxiety disorder)   . Genital herpes   . Depression   . History of abnormal Pap smear    Social History   Social History  . Marital Status: Married    Spouse Name: N/A  . Number of Children: N/A  . Years of Education: N/A   Occupational History  . Not on file.   Social History Main Topics  . Smoking status: Never Smoker   . Smokeless tobacco: Not on file  . Alcohol Use: No  . Drug Use: No  . Sexual Activity: Yes    Birth Control/ Protection: Surgical     Comment: Men only   Other Topics Concern  . Not on file   Social History Narrative   Live with three children and husband.  No EtOH or tobacco use.  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    Past Surgical History  Procedure Laterality Date  . Colposcopy    . Tubal ligation     Family History  Problem Relation Age of Onset  . Heart attack Father   . Hypertension Father   . Heart attack Cousin 39  . Diabetes Paternal Aunt   . Hypertension Mother   . Kidney failure Sister     ROS: Review of Systems Constitutional: negative Eyes: negative Ears, nose, mouth, throat, and face: negative Respiratory: negative Cardiovascular: negative Gastrointestinal: negative Genitourinary:positive for genital lesions and vaginal discharge Integument/breast: positive for axillary lump Hematologic/lymphatic: negative Musculoskeletal:positive for back pain and stiff joints Neurological: negative Behavioral/Psych: negative Endocrine: negative Allergic/Immunologic: negative   Physical exam BP 130/71 mmHg  Pulse 82  Temp(Src) 98.5 F (36.9 C) (Oral)  Wt 302 lb (136.986 kg)  SpO2 99%  LMP 07/17/2015 (Exact Date) General appearance: alert, cooperative, appears stated age and morbidly obese Head: Normocephalic, without obvious abnormality, atraumatic Eyes: conjunctivae/corneas clear. PERRL, EOM's intact. Fundi benign. Ears: normal TM's and external ear canals both ears Nose: Nares normal. Septum midline. Mucosa normal. No drainage or sinus tenderness. Throat: lips, mucosa, and tongue normal; teeth and gums normal Neck: no adenopathy, supple, symmetrical, trachea midline and thyroid not enlarged, symmetric, no tenderness/mass/nodules Back: symmetric, no curvature. ROM normal. No CVA tenderness. Lungs: clear to auscultation bilaterally Breasts:  normal appearance, no masses or tenderness, does have bilateral fat pad underneath axilla.  no axillary or supraclavicular LAD appreciated.  no dominant masses.  no bleeding/ discharge from nipples.  +gynecomastia Heart: regular rate and rhythm, S1, S2 normal, no murmur, click, rub or gallop Abdomen: soft, non-tender; bowel  sounds normal; no masses,  no organomegaly Pelvic: cervix normal in appearance, external genitalia normal, no adnexal masses or tenderness, no cervical motion tenderness, rectovaginal septum normal, uterus normal size, shape, and consistency and vagina normal without discharge; moderate bleeding from cervical os (patient actively menstruating) Extremities: extremities normal, atraumatic, no cyanosis or edema Pulses: 2+ and symmetric Skin: Skin color, texture, turgor normal. No rashes or lesions Lymph nodes: Cervical, supraclavicular, and axillary nodes normal. Neurologic: Alert and oriented X 3, normal strength and tone. Normal symmetric reflexes. Normal coordination and gait   Results for orders placed or performed in visit on 07/17/15 (from the past 24 hour(s))  POCT Wet Prep [facility]([place]     Status: None   Collection Time: 07/17/15  2:56 PM  Result Value Ref Range   Source Wet Prep POC VAG    WBC, Wet Prep HPF POC RARE    Bacteria Wet Prep HPF POC Few None, Few, Too numerous to count   Clue Cells Wet Prep HPF POC None None, Too numerous to count   Clue Cells Wet Prep Whiff POC Negative Whiff    Yeast Wet Prep HPF POC None    Trichomonas Wet Prep HPF POC NONE     Assessment/ Plan: 1. Well woman exam with routine gynecological exam - Medical history reviewed and updated. - Will need to recommend TDap at next visit - Pap performed as below  2. Genital herpes - On suppressive therapy, as is having >6 outbreaks per year - No lesions appreciated on today's exam  3. Vaginal discharge.  No evidence of yeast or BV infection on wet prep. - POCT Wet Prep [facility]([place] [place]) - Will have CMA call in am to let patient know of negative wet prep.  4. Screening for cervical cancer; h/o ASCUS +/ HPV neg in 2013. - GC/CT and HPV co-testing added to pap. - Cytology - PAP - Will contact with results  5. Morbid obesity due to excess calories (HCC) - Is in the final months of eligibility for bariatric  surgery with [facility]. - Lipid panel - Continue lifestyle modifications  Follow up in 1 year for annual physical exam or sooner if needed.  Jayshawn Colston M. Nadine CountsGottschalk, DO PGY-3, [facility]

## 2015-07-18 ENCOUNTER — Telehealth: Payer: Self-pay | Admitting: Family Medicine

## 2015-07-18 ENCOUNTER — Telehealth: Payer: Self-pay

## 2015-07-18 ENCOUNTER — Encounter: Payer: Self-pay | Admitting: Family Medicine

## 2015-07-18 LAB — CYTOLOGY - PAP

## 2015-07-18 NOTE — Telephone Encounter (Signed)
Patient informed. Kelsey Zavala, CMA  

## 2015-07-18 NOTE — Telephone Encounter (Signed)
Attempted to call with results.  Pap smear looks normal. Negative HPV. Retest in 3 years.  Lipid panel WNL.  Looks good to go from primary care standpoint for bariatric surgery.  Letter sent to home with results.  Ashly M. Nadine CountsGottschalk, DO PGY-3, [facility]

## 2015-07-18 NOTE — Telephone Encounter (Signed)
-----   Message from Raliegh IpAshly M Gottschalk, DO sent at 07/17/2015  7:45 PM EDT ----- Please call patient in am to report negative wet prep result.  No evidence of BV or yeast.  Will contact with result of pap smear.

## 2015-07-29 ENCOUNTER — Other Ambulatory Visit: Payer: Self-pay | Admitting: Family Medicine

## 2015-07-29 ENCOUNTER — Telehealth: Payer: Self-pay | Admitting: *Deleted

## 2015-07-29 DIAGNOSIS — N946 Dysmenorrhea, unspecified: Secondary | ICD-10-CM

## 2015-07-29 NOTE — Telephone Encounter (Signed)
Patient that she is having pelvic pain and that at last appointment the possibility of getting an ultrasound was discussed. Patient would like to proceed with this, states she would like to be scheduled somewhere where she can make payments.

## 2015-07-29 NOTE — Telephone Encounter (Signed)
Will forward to PCP to order. Jazmin Hartsell,CMA

## 2015-07-29 NOTE — Telephone Encounter (Signed)
It appears that she had an active order for u/s in the system.  However, I was unable to see when it expires, so new orders placed.  Please assist patient in setting this up.

## 2015-08-06 NOTE — Telephone Encounter (Signed)
Contacted pt to let her know her appointment for this [place] is on 08/14/15 @ 9am at [facility], she is to srrive with a full bladder. Lamonte Sakai, April D, [place]

## 2015-08-14 ENCOUNTER — Ambulatory Visit (HOSPITAL_COMMUNITY)
Admission: RE | Admit: 2015-08-14 | Discharge: 2015-08-14 | Disposition: A | Discharge status: Home / Self Care | Payer: 59 | Source: Ambulatory Visit | Attending: Family Medicine | Admitting: Family Medicine

## 2015-08-14 ENCOUNTER — Encounter: Payer: Self-pay | Admitting: Family Medicine

## 2015-08-14 DIAGNOSIS — N946 Dysmenorrhea, unspecified: Secondary | ICD-10-CM | POA: Insufficient documentation

## 2015-08-15 ENCOUNTER — Telehealth: Payer: Self-pay | Admitting: Family Medicine

## 2015-08-15 NOTE — Telephone Encounter (Signed)
Pt would like the results of her u/s. Please advise. Thanks! ep

## 2015-08-16 NOTE — Telephone Encounter (Signed)
Attempted to call patient.  No answer.  I sent out a letter with results 8/9.  Her ultrasound was normal.  Please attempt to call patient again today to relay this result.

## 2015-08-16 NOTE — Telephone Encounter (Signed)
LVM for pt to call back to inform her of below. Zimmerman Rumple, April D, CMA  

## 2015-08-16 NOTE — Telephone Encounter (Signed)
PT informed of below. Zimmerman Rumple, April D, CMA  

## 2015-09-11 ENCOUNTER — Ambulatory Visit: Payer: 59 | Admitting: Family Medicine

## 2015-09-19 ENCOUNTER — Encounter: Payer: Self-pay | Admitting: Family Medicine

## 2015-09-19 ENCOUNTER — Ambulatory Visit (INDEPENDENT_AMBULATORY_CARE_PROVIDER_SITE_OTHER): Payer: 59 | Admitting: Family Medicine

## 2015-09-19 VITALS — BP 152/96 | HR 81 | Temp 98.3°F | Wt 300.0 lb

## 2015-09-19 DIAGNOSIS — M545 Low back pain, unspecified: Secondary | ICD-10-CM

## 2015-09-19 MED ORDER — MELOXICAM 15 MG PO TABS: 15.0000 mg | ORAL_TABLET | Freq: Every day | ORAL | 0 refills | Status: DC

## 2015-09-19 NOTE — Patient Instructions (Signed)
I am concerned that you have a disc problem.  I am referring you to orthopedics for evaluation.  We will defer your imaging to them.  If you develop numbness/ tingling, weakness, loss of bowel or bladder function, numbness in your groin area, I want you to seek immediate evaluation.  Take your Meloxicam once daily with food.

## 2015-09-19 NOTE — Progress Notes (Signed)
    Subjective: CC: abdominal pain HPI: Kelsey Zavala is a 40 y.o. female presenting to clinic today for follow up. Concerns today include:  1. Pain Patient reports that she first started having symptoms in July.  She reports normal menstrual cycles.  She had a pelvic u/s that was unremarkable.  Pain is now radiating into back.  She reports that pain is sudden and throbbing.  She reports that her movement is restricted during this time.  She reports that pain radiates into groin.  Pain is worse with lying down.  She reports that pain is worst in the morning. She reports that she has replaced her bedding to see if this would improve symptoms.  Patient has been taking Motrin daily, she reports this takes the edge off.  She has been performing stretches.  No numbness, tingling.  Denies h/o previous injury or fracture.  She reports that RLE buckled while walking today.  Social History Reviewed: non smoker. FamHx and MedHx reviewed.  Please see EMR.  ROS: Per HPI  Objective: Office vital signs reviewed. BP (!) 152/96   Pulse 81   Temp 98.3 F (36.8 C) (Oral)   Wt 300 lb (136.1 kg)   LMP 09/06/2015   SpO2 100%   BMI 45.61 kg/m   Physical Examination:  General: Awake, alert, obese, No acute distress Extremities: warm, well perfused, No edema, cyanosis or clubbing; +2 pulses bilaterally MSK: Normal gait and station, normal spine curvature.  Patient has full AROM of lumbar spine.  No midline or paraspinal TTP. RLE: 4/5 right hip flexion, +pain with FADIR, +straight leg raise Neuro: Light touch sensation of LE grossly intact, 1/4 patellar and achilles DTRs bilaterally. Normal tip toe walk, normal heel walk, abnormal heel to toe walk.  Assessment/ Plan: 40 y.o. female   1. Right-sided low back pain without sciatica.  I am concerned about disc pathology.  Though had positive FADIR, so could also have hip pathology.  Though my suspicion is higher for lumbar discopathy.  Discussed obtaining  imaging.  However, patient wishes to have all imaging done w/ orthopedic if possible.  She demonstrates no emergent symptoms, so I think that this is reasonable. - STOP Motrin.  Start Mobic w/ food daily - Ambulatory referral to Orthopedic Surgery - Strict return precautions reviewed  Raliegh IpAshly M Alyla Pietila, DO PGY-3, [facility]

## 2015-10-01 ENCOUNTER — Telehealth: Payer: Self-pay | Admitting: *Deleted

## 2015-10-01 NOTE — Telephone Encounter (Signed)
If she cannot wait until her appt w ortho, she may consider [facility] during their after hours urgent care office, which I believe starts at 5:30pm.  She does not require an appt for this.

## 2015-10-01 NOTE — Telephone Encounter (Signed)
Pt informed of below. Zimmerman Rumple, April D, CMA  

## 2015-10-01 NOTE — Telephone Encounter (Signed)
Patient called and states that she has had increased pain since the last time she was in the office.  Informed her that her referral was faxed a couple of days after her appt here on the 14th and that it was also refaxed today.  Patient is aware of this but would like to know if there is something she can do in the meantime. Kelsey Zavala,CMA

## 2015-10-04 ENCOUNTER — Other Ambulatory Visit: Payer: Self-pay | Admitting: *Deleted

## 2015-10-04 MED ORDER — MELOXICAM 15 MG PO TABS: 15.0000 mg | ORAL_TABLET | Freq: Every day | ORAL | 0 refills | Status: DC

## 2015-10-08 ENCOUNTER — Other Ambulatory Visit: Payer: Self-pay | Admitting: *Deleted

## 2015-10-08 NOTE — Telephone Encounter (Signed)
Refill request for 90 day supply.  Martin, Tamika L, RN  

## 2015-10-09 NOTE — Telephone Encounter (Signed)
Patient is only to take this medication until she sees Ortho.  30 day supply should be sufficient

## 2015-11-21 ENCOUNTER — Ambulatory Visit: Payer: 59 | Admitting: Student

## 2015-12-06 ENCOUNTER — Emergency Department (HOSPITAL_COMMUNITY): Payer: 59

## 2015-12-06 ENCOUNTER — Emergency Department (HOSPITAL_COMMUNITY)
Admission: EM | Admit: 2015-12-06 | Discharge: 2015-12-06 | Disposition: A | Discharge status: Home / Self Care | Payer: 59 | Attending: Emergency Medicine | Admitting: Emergency Medicine

## 2015-12-06 ENCOUNTER — Ambulatory Visit: Payer: 59 | Admitting: Internal Medicine

## 2015-12-06 ENCOUNTER — Encounter (HOSPITAL_COMMUNITY): Payer: Self-pay | Admitting: Emergency Medicine

## 2015-12-06 DIAGNOSIS — I10 Essential (primary) hypertension: Secondary | ICD-10-CM | POA: Insufficient documentation

## 2015-12-06 DIAGNOSIS — Z79899 Other long term (current) drug therapy: Secondary | ICD-10-CM | POA: Diagnosis not present

## 2015-12-06 DIAGNOSIS — R0789 Other chest pain: Secondary | ICD-10-CM | POA: Diagnosis not present

## 2015-12-06 DIAGNOSIS — R079 Chest pain, unspecified: Secondary | ICD-10-CM

## 2015-12-06 LAB — CBC
HEMATOCRIT: 36.4 % (ref 36.0–46.0)
HEMOGLOBIN: 12.2 g/dL (ref 12.0–15.0)
MCH: 30.2 pg (ref 26.0–34.0)
MCHC: 33.5 g/dL (ref 30.0–36.0)
MCV: 90.1 fL (ref 78.0–100.0)
Platelets: 308 10*3/uL (ref 150–400)
RBC: 4.04 MIL/uL (ref 3.87–5.11)
RDW: 14 % (ref 11.5–15.5)
WBC: 6.1 10*3/uL (ref 4.0–10.5)

## 2015-12-06 LAB — BASIC METABOLIC PANEL
ANION GAP: 6 (ref 5–15)
BUN: 18 mg/dL (ref 6–20)
CHLORIDE: 103 mmol/L (ref 101–111)
CO2: 27 mmol/L (ref 22–32)
Calcium: 9.3 mg/dL (ref 8.9–10.3)
Creatinine, Ser: 0.72 mg/dL (ref 0.44–1.00)
GFR calc Af Amer: >60 mL/min (ref >60)
GLUCOSE: 77 mg/dL (ref 65–99)
POTASSIUM: 3.7 mmol/L (ref 3.5–5.1)
Sodium: 136 mmol/L (ref 135–145)

## 2015-12-06 LAB — I-STAT TROPONIN, ED: Troponin i, poc: 0 ng/mL (ref 0.00–0.08)

## 2015-12-06 NOTE — ED Notes (Signed)
Pt refused to be placed on cardiac monitoring. Pt stated "they already did all that ill just wait for the doctor. I want to go home".

## 2015-12-06 NOTE — ED Notes (Signed)
Pt refused last set of vitals

## 2015-12-06 NOTE — ED Triage Notes (Signed)
Patient states she has chest pain x 2 days.  Pain started last night and has increased over time.  She states she has some weakness. She feels like her body is "heavy"

## 2015-12-06 NOTE — ED Provider Notes (Signed)
[facility]-EMERGENCY DEPT Provider Note   CSN: [id number] Arrival date & time: 12/06/15  1824     History   Chief Complaint Chief Complaint  Patient presents with  . Chest Pain    HPI Kelsey Zavala is a 40 y.o. female.  HPI  40 year old female presents with chest pain since yesterday. Chest pains in the middle of her chest and radiates under both breasts. Started yesterday but was not as bad as it became this afternoon when she left the meeting. She states that walking seemed to make it worse. Deep inspirations also make it worse as well as when she had to lift her hands over her head for the chest x-ray. Pain is a pressure. No associated shortness breath, diaphoresis, nausea, or vomiting. No leg swelling or leg pain. No recent travel, hemoptysis, or estrogen use. No history of DVT. Has a history of hypertension and obesity, denies other medical problems including no smoking. Father has coronary disease, first developed this in his 7460s. Pain seems to be somewhat better but not gone. She has not tried anything for the pain. She states she felt weak on her feet but not dizzy or lightheaded earlier. Has resolved.  Past Medical History:  Diagnosis Date  . Depression   . GAD (generalized anxiety disorder)   . Genital herpes   . History of abnormal Pap smear   . HTN (hypertension)     Patient Active Problem List   Diagnosis Date Noted  . Itching 06/20/2015  . Vaginal yeast infection 06/11/2015  . Abnormal Pap smear of cervix 04/04/2015  . Meralgia paraesthetica 11/16/2014  . Dysmenorrhea 08/13/2014  . Grief reaction 08/13/2014  . Flank pain, acute 05/24/2014  . Morbid obesity (HCC) 03/29/2014  . Edema 03/03/2014  . Polymyalgia (HCC) 12/13/2013  . Essential hypertension, benign 09/26/2013  . Wellness examination 09/26/2013  . Acne 02/23/2011  . Other and unspecified ovarian cyst 09/25/2008  . PROTEINURIA, MILD 09/25/2008  . Genital herpes 08/16/2006  . Obesity 03/04/2006  . TOBACCO  USE, QUIT 03/04/2006    Past Surgical History:  Procedure Laterality Date  . COLPOSCOPY    . TUBAL LIGATION      OB History    No data available       Home Medications    Prior to Admission medications   Medication Sig Start Date End Date Taking? Authorizing Provider  hydrochlorothiazide (HYDRODIURIL) 25 MG tablet Take 1 tablet (25 mg total) by mouth daily. 07/17/15  Yes Ashly M Gottschalk, DO  lisinopril (PRINIVIL,ZESTRIL) 10 MG tablet Take 1 tablet (10 mg total) by mouth daily. 07/17/15  Yes Ashly Hulen SkainsM Gottschalk, DO  Multiple Vitamins-Minerals (MULTIVITAMIN ADULTS) TABS Take 1 tablet by mouth daily.   Yes Historical Provider, MD  hydrocortisone 1 % ointment Apply 1 application topically 2 (two) times daily as needed for itching. 06/20/15   Jamal CollinJames R Joyner, MD  hydrOXYzine (VISTARIL) 25 MG capsule Take 1 capsule (25 mg total) by mouth 3 (three) times daily as needed. Patient not taking: Reported on 12/06/2015 06/20/15   Jamal CollinJames R Joyner, MD  meloxicam (MOBIC) 15 MG tablet Take 1 tablet (15 mg total) by mouth daily. Patient not taking: Reported on 12/06/2015 10/04/15   Raliegh IpAshly M Gottschalk, DO  valACYclovir (VALTREX) 500 MG tablet Take 1 tablet once daily for suppression. Can take 1 tablet by mouth two times a day x 3 days for outbreaks 07/11/15   Kelsey Mayra ReelZahra Mikell, MD    Family History Family History  Problem Relation  Age of Onset  . Heart attack Father   . Hypertension Father   . Diabetes Father   . Hypertension Mother   . Aneurysm Mother   . Autism Daughter   . Heart attack Cousin 39  . Diabetes Paternal Aunt   . Kidney failure Sister     Social History Social History  Substance Use Topics  . Smoking status: Never Smoker  . Smokeless tobacco: Never Used  . Alcohol use No     Allergies   Norvasc [amlodipine besylate]   Review of Systems Review of Systems  Constitutional: Negative for diaphoresis.  Respiratory: Negative for cough and shortness of breath.     Cardiovascular: Positive for chest pain.  Gastrointestinal: Negative for nausea and vomiting.  All other systems reviewed and are negative.    Physical Exam Updated Vital Signs BP 132/84 (BP Location: Left Arm)   Pulse 94   Temp 99.3 F (37.4 C) (Oral)   Resp 18   Ht 5\' 8"  (1.727 m)   Wt 243 lb 6 oz (110.4 kg)   LMP 11/08/2015   SpO2 100%   BMI 37.01 kg/m   Physical Exam  Constitutional: She is oriented to person, place, and time. She appears well-developed and well-nourished. No distress.  obese  HENT:  Head: Normocephalic and atraumatic.  Right Ear: External ear normal.  Left Ear: External ear normal.  Nose: Nose normal.  Eyes: Right eye exhibits no discharge. Left eye exhibits no discharge.  Cardiovascular: Normal rate, regular rhythm and normal heart sounds.   Pulmonary/Chest: Effort normal and breath sounds normal. She exhibits no tenderness.  Abdominal: Soft. There is no tenderness.  Musculoskeletal: She exhibits no edema.  Neurological: She is alert and oriented to person, place, and time.  Skin: Skin is warm and dry. She is not diaphoretic.  Nursing note and vitals reviewed.    ED Treatments / Results  Labs (all labs ordered are listed, but only abnormal results are displayed) Labs Reviewed  BASIC METABOLIC PANEL  CBC  I-STAT TROPOININ, ED    EKG  EKG Interpretation  Date/Time:  Friday December 06 2015 18:49:48 EST Ventricular Rate:  93 PR Interval:    QRS Duration: 74 QT Interval:  343 QTC Calculation: 427 R Axis:   53 Text Interpretation:  Normal sinus rhythm Borderline T wave abnormalities no significant change since Mar 2017 Confirmed by Criss Alvine MD, Jacquel Redditt [phone]) on 12/06/2015 10:04:58 PM       Radiology Dg Chest 2 View  Result Date: 12/06/2015 CLINICAL DATA:  40 year old female with mid chest pain radiating into each breast since yesterday, with some associated shortness of breath today. EXAM: CHEST  2 VIEW COMPARISON:  Chest x-ray  03/16/2015. FINDINGS: Lung volumes are normal. No consolidative airspace disease. No pleural effusions. No pneumothorax. No pulmonary nodule or mass noted. Pulmonary vasculature and the cardiomediastinal silhouette are within normal limits. IMPRESSION: No radiographic evidence of acute cardiopulmonary disease. Electronically Signed   By: Trudie Reed M.D.   On: 12/06/2015 19:34    Procedures Procedures (including critical care time)  Medications Ordered in ED Medications - No data to display   Initial Impression / Assessment and Plan / ED Course  I have reviewed the triage vital signs and the nursing notes.  Pertinent labs & imaging results that were available during my care of the patient were reviewed by me and considered in my medical decision making (see chart for details).  Clinical Course     Initial workup is unremarkable.  Nonspecific T waves on ECG but unchanged since March. Atypical history, overall well appearing. HEART score 2. Low risk for PE, negative PERC score. I feel patient is low enough risk that a delta troponin is appropriate and follow-up with PCP. When talking to patient, she does not want to wait on a second troponin even though the next troponin would be drawn now. She would like to be discharged since no acute findings have been found. I discussed that the workup is technically not complete as well she needs to follow up with her PCP. She understands we could be missing MI without the second troponin. Probably some sort of chest wall etiology given the pleuritic nature and with movement of her arms. Recommend NSAIDs. Discussed strict return precautions.  Final Clinical Impressions(s) / ED Diagnoses   Final diagnoses:  Nonspecific chest pain    New Prescriptions New Prescriptions   No medications on file     Pricilla LovelessScott Hagan Vanau[place], MD 12/06/15 2232

## 2015-12-09 ENCOUNTER — Telehealth: Payer: Self-pay | Admitting: Family Medicine

## 2015-12-09 NOTE — Telephone Encounter (Signed)
Phone rang but I couldn't leave a message to remind her about her appt 12/5 since she had a NS 12/1 - Kelsey Zavala

## 2015-12-10 ENCOUNTER — Telehealth: Payer: Self-pay | Admitting: Family Medicine

## 2015-12-10 ENCOUNTER — Ambulatory Visit (INDEPENDENT_AMBULATORY_CARE_PROVIDER_SITE_OTHER): Payer: 59 | Admitting: Family Medicine

## 2015-12-10 ENCOUNTER — Ambulatory Visit (HOSPITAL_COMMUNITY)
Admission: RE | Admit: 2015-12-10 | Discharge: 2015-12-10 | Disposition: A | Discharge status: Home / Self Care | Payer: 59 | Source: Ambulatory Visit | Attending: Family Medicine | Admitting: Family Medicine

## 2015-12-10 ENCOUNTER — Encounter: Payer: Self-pay | Admitting: Family Medicine

## 2015-12-10 VITALS — BP 128/82 | HR 66 | Temp 98.5°F | Wt 297.2 lb

## 2015-12-10 DIAGNOSIS — R0789 Other chest pain: Secondary | ICD-10-CM | POA: Diagnosis not present

## 2015-12-10 LAB — TROPONIN I

## 2015-12-10 NOTE — Progress Notes (Signed)
Subjective: CC: ED follow up/ surgical assessment  HPI: Kelsey Zavala is a 40 y.o. female presenting to clinic today for follow up. Concerns today include:  1. ED follow up for CP [facility].  She reports that she had Chest pain that radiated under her breasts at that time.  She notes that she was having a very stressful day at work and developed this sensation.  She denies nausea, vomiting, diaphoresis, visual disturbance, dizziness but does note she felt a little weak.  Additionally, reports that she had not eaten or had anything to drink all day.  She reports that she had cardiac monitoring and troponin x1 done.  From the records, it appears that a second trop was recommended.  Patient notes that she declined this due to the first being normal and her being told that everything looked fine on EKG.  She did not want to be charged for unnecessary testing.  She notes that she was told by her anesthesiologist, that she is not eligible for surgery until she has cardiac clearance.  She is here to get this done, as she is scheduled for pre-op on Friday.  She denies CP, SOB, nausea, vomiting, diaphoresis, visual disturbance, weakness.  She notes symptoms resolved in the ED.  She reports compliance with antihypertensive regimen.  She is agreeable to having repeat troponin and EKG performed in office today.  Additionally, no history of MI, valvular disease, arrhythmia, or heart failure.  No history of DM, CVA.  She is able to perform >4 mets.  Social History Reviewed: non smoker. FamHx and MedHx reviewed.  Please see EMR. Health Maintenance: TDap, Flu shot  ROS: Per HPI  Objective: Office vital signs reviewed. BP 128/82   Pulse 66   Temp 98.5 F (36.9 C) (Oral)   Wt 297 lb 3.2 oz (134.8 kg)   LMP 11/08/2015   SpO2 99%   BMI 45.19 kg/m   Physical Examination:  General: Awake, alert, obese, No acute distress HEENT: EOMI, MMM Cardio: regular rate and rhythm, S1S2 heard,  no murmurs appreciated Pulm: clear to auscultation bilaterally, no wheezes, rhonchi or rales, normal WOB on room air Extremities: warm, well perfused, No edema, cyanosis or clubbing; +2 pulses bilaterally MSK: Normal gait and station  Orders placed or performed in visit on 12/10/15  . EKG 12-Lead   Results for orders placed or performed in visit on 12/10/15 (from the past 24 hour(s))  Troponin I     Status: None   Collection Time: 12/10/15 11:36 AM  Result Value Ref Range   Troponin I <0.03 <0.03 ng/mL   Assessment/ Plan: 40 y.o. female   1. Atypical chest pain, now resolved. 2. Pre-op evaluation  12/06/2015 ED notes and studies reviewed.  I have independently evaluated patient.  Kelsey Zavala is a 40 y.o. female who is low risk for an high risk surgery.  There are not modifiable risk factors (smoking, etc). Kelsey Zavala RCRI/NSQIP calculation for MACE is: 1 (for intraperitoneal surgery).   Recommend holding her antihypertensive meds, particularly ACE- I, day of surgery.  EKG repeated today and was unchanged from previous and showed no evidence of acute ischemic changes. Serum Troponin repeated and was negative.    Letter written with results for patient to pick up.  She was encouraged to follow up as needed and contact me with any questions should the need arise.  This patient was discussed with Dr Gwendolyn GrantWalden, who agrees with my  assessment and plan.   Raliegh IpAshly M Orvel Cutsforth, DO PGY-3, [facility]

## 2015-12-10 NOTE — Telephone Encounter (Signed)
Attempted to call patient with troponin results which were NEGATIVE.  I have written a note for patient and placed up front with results.  If patient calls back, please let her know this is ready for pick up.  Kelsey M. Nadine CountsGottschalk, DO PGY-3, [facility]

## 2015-12-11 ENCOUNTER — Other Ambulatory Visit: Payer: Self-pay | Admitting: *Deleted

## 2015-12-11 MED ORDER — HYDROCHLOROTHIAZIDE 25 MG PO TABS: 25.0000 mg | ORAL_TABLET | Freq: Every day | ORAL | 3 refills | Status: DC

## 2015-12-11 MED ORDER — VALACYCLOVIR HCL 500 MG PO TABS: ORAL_TABLET | ORAL | 3 refills | Status: DC

## 2015-12-11 MED ORDER — LISINOPRIL 10 MG PO TABS: 10.0000 mg | ORAL_TABLET | Freq: Every day | ORAL | 3 refills | Status: DC

## 2015-12-16 ENCOUNTER — Encounter: Payer: 59 | Attending: Surgery | Admitting: Dietician

## 2015-12-16 DIAGNOSIS — Z713 Dietary counseling and surveillance: Secondary | ICD-10-CM | POA: Insufficient documentation

## 2015-12-16 NOTE — Progress Notes (Signed)
Pt is being scheduled for preop appt; please place surgical orders in epic. Thanks.  

## 2015-12-17 NOTE — Progress Notes (Signed)
  Pre-Operative Nutrition Class:  Appt start time: 6269   End time:  1830.  Patient was seen on 12/16/2015 for Pre-Operative Bariatric Surgery [facility].   Surgery date: 12/25/2015 Surgery type: Sleeve gastrectomy Start weight at [facility]: none available Weight today: 296.5 lbs  TANITA  BODY COMP RESULTS  12/16/15   BMI (kg/m^2) n/a   Fat Mass (lbs)    Fat Free Mass (lbs)    Total Body Water (lbs)     Samples given per MNT protocol. Patient educated on appropriate usage: Premier protein shake (strawberry - qty 1) Lot #: [id number] Exp: 10/2016  Unjury Protein Powder (chicken soup - qty 1) Lot #: [id number] Exp: 03/2017  The following the learning objectives were met by the patient during this course:  Identify Pre-Op Dietary Goals and will begin 2 weeks pre-operatively  Identify appropriate sources of fluids and proteins   State protein recommendations and appropriate sources pre and post-operatively  Identify Post-Operative Dietary Goals and will follow for 2 weeks post-operatively  Identify appropriate multivitamin and calcium sources  Describe the need for physical activity post-operatively and will follow MD recommendations  State when to call healthcare provider regarding medication questions or post-operative complications  Handouts given during class include:  Pre-Op Bariatric Surgery Diet Handout  Protein Shake Handout  Post-Op Bariatric Surgery Nutrition Handout  BELT Program Information Flyer  Support Group Information Flyer  [facility] Bariatric Supplements Price List  Follow-Up Plan: Patient will follow-up at [facility] 2 weeks post operatively for diet advancement per MD.

## 2015-12-18 ENCOUNTER — Ambulatory Visit: Payer: Self-pay | Admitting: Surgery

## 2015-12-18 ENCOUNTER — Encounter: Payer: Self-pay | Admitting: Dietician

## 2015-12-18 ENCOUNTER — Encounter (HOSPITAL_COMMUNITY): Payer: Self-pay

## 2015-12-18 ENCOUNTER — Encounter (HOSPITAL_COMMUNITY)
Admission: RE | Admit: 2015-12-18 | Discharge: 2015-12-18 | Disposition: A | Discharge status: Home / Self Care | Payer: 59 | Source: Ambulatory Visit | Attending: Surgery | Admitting: Surgery

## 2015-12-18 DIAGNOSIS — Z01812 Encounter for preprocedural laboratory examination: Secondary | ICD-10-CM | POA: Diagnosis not present

## 2015-12-18 DIAGNOSIS — E669 Obesity, unspecified: Secondary | ICD-10-CM | POA: Insufficient documentation

## 2015-12-18 LAB — CBC WITH DIFFERENTIAL/PLATELET
BASOS ABS: 0 10*3/uL (ref 0.0–0.1)
BASOS PCT: 0 %
EOS ABS: 0.2 10*3/uL (ref 0.0–0.7)
Eosinophils Relative: 4 %
HEMATOCRIT: 36 % (ref 36.0–46.0)
HEMOGLOBIN: 11.7 g/dL — AB (ref 12.0–15.0)
Lymphocytes Relative: 40 %
Lymphs Abs: 1.8 10*3/uL (ref 0.7–4.0)
MCH: 30.1 pg (ref 26.0–34.0)
MCHC: 32.5 g/dL (ref 30.0–36.0)
MCV: 92.5 fL (ref 78.0–100.0)
MONOS PCT: 8 %
Monocytes Absolute: 0.4 10*3/uL (ref 0.1–1.0)
NEUTROS ABS: 2.2 10*3/uL (ref 1.7–7.7)
NEUTROS PCT: 48 %
Platelets: 294 10*3/uL (ref 150–400)
RBC: 3.89 MIL/uL (ref 3.87–5.11)
RDW: 14.1 % (ref 11.5–15.5)
WBC: 4.5 10*3/uL (ref 4.0–10.5)

## 2015-12-18 LAB — COMPREHENSIVE METABOLIC PANEL
ALBUMIN: 3.8 g/dL (ref 3.5–5.0)
ALK PHOS: 62 U/L (ref 38–126)
ALT: 24 U/L (ref 14–54)
ANION GAP: 5 (ref 5–15)
AST: 23 U/L (ref 15–41)
BILIRUBIN TOTAL: 0.6 mg/dL (ref 0.3–1.2)
BUN: 12 mg/dL (ref 6–20)
CALCIUM: 8.9 mg/dL (ref 8.9–10.3)
CO2: 26 mmol/L (ref 22–32)
Chloride: 106 mmol/L (ref 101–111)
Creatinine, Ser: 0.74 mg/dL (ref 0.44–1.00)
GFR calc Af Amer: >60 mL/min (ref >60)
GFR calc non Af Amer: >60 mL/min (ref >60)
GLUCOSE: 91 mg/dL (ref 65–99)
Potassium: 3.9 mmol/L (ref 3.5–5.1)
SODIUM: 137 mmol/L (ref 135–145)
TOTAL PROTEIN: 7.2 g/dL (ref 6.5–8.1)

## 2015-12-18 NOTE — Progress Notes (Signed)
EKG 12-10-15 EPIC CHEST XRAY 12-06-15 EPIC

## 2015-12-18 NOTE — Patient Instructions (Addendum)
Larey BrickDedra L Tolson  12/18/2015   Your procedure is scheduled on: 12-25-15  [facility]  [facility]  elevators to 3rd floor to  Short Stay Center at 740 AM.  Call this number if you have problems the morning of surgery [phone]   Remember: ONLY 1 PERSON MAY GO WITH YOU TO SHORT STAY TO GET  READY MORNING OF YOUR SURGERY.  Do not eat food or drink liquids :After Midnight.     Take these medicines the morning of surgery with A SIP OF WATER: NONE                               You may not have any metal on your body including hair pins and              piercings  Do not wear jewelry, make-up, lotions, powders or perfumes, deodorant             Do not wear nail polish.  Do not shave  48 hours prior to surgery.              Men may shave face and neck.   Do not bring valuables to the hospital. [facility] IS NOT             RESPONSIBLE   FOR VALUABLES.  Contacts, dentures or bridgework may not be worn into surgery.  Leave suitcase in the car. After surgery it may be brought to your room.                  Please read over the following fact sheets you were given: _____________________________________________________________________             [facility] - Preparing for Surgery Before surgery, you can play an important role.  Because skin is not sterile, your skin needs to be as free of germs as possible.  You can reduce the number of germs on your skin by washing with CHG (chlorahexidine gluconate) soap before surgery.  CHG is an antiseptic cleaner which kills germs and bonds with the skin to continue killing germs even after washing. Please DO NOT use if you have an allergy to CHG or antibacterial soaps.  If your skin becomes reddened/irritated stop using the CHG and inform your nurse when you arrive at Short Stay. Do not shave (including legs and underarms) for at least 48 hours prior to the first CHG shower.  You may shave your  face/neck. Please follow these instructions carefully:  1.  Shower with CHG Soap the night before surgery and the  morning of Surgery.  2.  If you choose to wash your hair, wash your hair first as usual with your  normal  shampoo.  3.  After you shampoo, rinse your hair and body thoroughly to remove the  shampoo.                           4.  Use CHG as you would any other liquid soap.  You can apply chg directly  to the skin and wash                       Gently with a scrungie or clean washcloth.  5.  Apply the CHG Soap to  your body ONLY FROM THE NECK DOWN.   Do not use on face/ open                           Wound or open sores. Avoid contact with eyes, ears mouth and genitals (private parts).                       Wash face,  Genitals (private parts) with your normal soap.             6.  Wash thoroughly, paying special attention to the area where your surgery  will be performed.  7.  Thoroughly rinse your body with warm water from the neck down.  8.  DO NOT shower/wash with your normal soap after using and rinsing off  the CHG Soap.                9.  Pat yourself dry with a clean towel.            10.  Wear clean pajamas.            11.  Place clean sheets on your bed the night of your first shower and do not  sleep with pets. Day of Surgery : Do not apply any lotions/deodorants the morning of surgery.  Please wear clean clothes to the hospital/surgery center.  FAILURE TO FOLLOW THESE INSTRUCTIONS MAY RESULT IN THE CANCELLATION OF YOUR SURGERY PATIENT SIGNATURE_________________________________  NURSE SIGNATURE__________________________________  ________________________________________________________________________

## 2015-12-24 NOTE — H&P (Signed)
Kelsey Zavala 11/21/2015 3:51 PM Location: [facility] Office Patient #: [id number] DOB: 01/07/1975 Single / Language: [place] / Race: Black or African American Female   History of Present Illness Kelsey Zavala(Kelsey Amsden B. Daphine DeutscherMartin MD; 11/21/2015 4:58 PM) Patient words: Initial interview of patient who has had [facility]. Her BMI is 44 and hypertension is her only comorbidity. She has followed several friends who have had either bypass or sleeve gastrectomy. She has done her homework; she has given her a surgery time of Dec 29. Will check with Kelsey Zavala on this.   She has had a BTL and no history of DVT.  She has been heavy all of her life. Both of her parents are large. She has been working at this lately and has dropped from over 320 lbs to her present 293.  The patient is a 40 year old female.   Other Problems Kelsey Zavala(Kelsey Zavala, CMA; 11/21/2015 3:51 PM) High blood pressure  Sleep Apnea   Past Surgical History Kelsey Zavala(Kelsey Zavala, CMA; 11/21/2015 3:51 PM) Oral Surgery   Diagnostic Studies History Kelsey Zavala(Kelsey Zavala, CMA; 11/21/2015 3:51 PM) Colonoscopy  never Mammogram  never Pap Smear  1-5 years ago  Allergies Kelsey Zavala(Kelsey Zavala, CMA; 11/21/2015 3:52 PM) Norvasc *CALCIUM CHANNEL BLOCKERS*   Medication History (Kelsey DevoidChemira Zavala, CMA; 11/21/2015 3:52 PM) Phentermine HCl (37.5MG  Capsule, Oral) Active. Lisinopril (10MG  Tablet, Oral) Active. HydroCHLOROthiazide (25MG  Tablet, Oral) Active. Medications Reconciled  Social History Kelsey Zavala(Kelsey Zavala, CMA; 11/21/2015 3:51 PM) No alcohol use  No caffeine use  No drug use  Tobacco use  Never smoker.  Family History Kelsey Zavala(Kelsey Zavala, CMA; 11/21/2015 3:51 PM) Diabetes Mellitus  Father. Hypertension  Father, Mother.  Pregnancy / Birth History Kelsey Zavala(Kelsey Zavala, CMA; 11/21/2015 3:51 PM) Age at menarche  12 years. Contraceptive History  Depo-provera, Oral contraceptives. Gravida  4 Length (months) of breastfeeding   7-12 Maternal age  38-20 Para  3 Regular periods     Review of Systems Kelsey Zavala(Kelsey Zavala CMA; 11/21/2015 3:51 PM) General Present- Weight Loss. Not Present- Appetite Loss, Chills, Fatigue, Fever, Night Sweats and Weight Gain. Skin Not Present- Change in Wart/Mole, Dryness, Hives, Jaundice, New Lesions, Non-Healing Wounds, Rash and Ulcer. HEENT Not Present- Earache, Hearing Loss, Hoarseness, Nose Bleed, Oral Ulcers, Ringing in the Ears, Seasonal Allergies, Sinus Pain, Sore Throat, Visual Disturbances, Wears glasses/contact lenses and Yellow Eyes. Respiratory Not Present- Bloody sputum, Chronic Cough, Difficulty Breathing, Snoring and Wheezing. Breast Not Present- Breast Mass, Breast Pain, Nipple Discharge and Skin Changes. Cardiovascular Not Present- Chest Pain, Difficulty Breathing Lying Down, Leg Cramps, Palpitations, Rapid Heart Rate, Shortness of Breath and Swelling of Extremities. Gastrointestinal Not Present- Abdominal Pain, Bloating, Bloody Stool, Change in Bowel Habits, Chronic diarrhea, Constipation, Difficulty Swallowing, Excessive gas, Gets full quickly at meals, Hemorrhoids, Indigestion, Nausea, Rectal Pain and Vomiting. Female Genitourinary Not Present- Frequency, Nocturia, Painful Urination, Pelvic Pain and Urgency. Musculoskeletal Not Present- Back Pain, Joint Pain, Joint Stiffness, Muscle Pain, Muscle Weakness and Swelling of Extremities. Neurological Not Present- Decreased Memory, Fainting, Headaches, Numbness, Seizures, Tingling, Tremor, Trouble walking and Weakness. Psychiatric Not Present- Anxiety, Bipolar, Change in Sleep Pattern, Depression, Fearful and Frequent crying. Endocrine Not Present- Cold Intolerance, Excessive Hunger, Hair Changes, Heat Intolerance, Hot flashes and New Diabetes. Hematology Not Present- Blood Thinners, Easy Bruising, Excessive bleeding, Gland problems, HIV and Persistent Infections.  Vitals (Kelsey Zavala CMA; 11/21/2015 3:51 PM) 11/21/2015 3:51  PM Weight: 293.2 lb Height: 68in Body Surface Area: 2.4 m Body Mass Index: 44.58 kg/m  Temp.: 98.92F(Oral)  Pulse:  87 (Regular)  BP: 130/84 (Sitting, Left Arm, Standard)       Physical Exam (Jahmai Finelli B. Daphine DeutscherMartin MD; 11/21/2015 4:59 PM) General Note: WDAAF NAD HEENT unremarkable Neck supple Chest clear Heart SR without murmurs Abdomen nontender and periumbilical incisions from BTL Ext FROM and no DVT     Assessment & Plan Kelsey Zavala(Emiliano Welshans B. Daphine DeutscherMartin MD; 11/21/2015 5:01 PM) MORBID OBESITY (E66.01) Story: Two weeks prior to surgery Go on the extremely low carb liquid diet One week prior to surgery No aspirin products. Tylenol is acceptable Stop smoking 24 hours prior to surgery No alcoholic beverages Report fever greater than 100.5 or excessive nasal drainage suggesting infection Continue bariatric preop diet Perform bowel prep if ordered Do not eat or drink anything after midnight the night before surgery Do not take any medications except those instructed by the anesthesiologist Morning of surgery Please arrive at the hospital at least 2 hours before your scheduled surgery time. No makeup, fingernail polish or jewelry Bring insurance cards with you Bring your CPAP mask if you use this Impression: After discussing sleeve and bypass in detail including drawings, she wants to pursue sleeve gastrectomy. A sleeve gastrectomy permit was given to her. Will move forward with scheduling since she has completed the prerequisites  Plan:  Lap sleeve gastrectomy  Matt B. Daphine DeutscherMartin, MD, FACS

## 2015-12-25 ENCOUNTER — Encounter (HOSPITAL_COMMUNITY)
Admission: RE | Disposition: A | Discharge status: Home / Self Care | Payer: Self-pay | Source: Ambulatory Visit | Attending: Surgery

## 2015-12-25 ENCOUNTER — Inpatient Hospital Stay (HOSPITAL_COMMUNITY): Payer: 59 | Admitting: Anesthesiology

## 2015-12-25 ENCOUNTER — Inpatient Hospital Stay (HOSPITAL_COMMUNITY)
Admission: RE | Admit: 2015-12-25 | Discharge: 2015-12-28 | DRG: 621 | Disposition: A | Discharge status: Home / Self Care | Payer: 59 | Source: Ambulatory Visit | Attending: Surgery | Admitting: Surgery

## 2015-12-25 ENCOUNTER — Encounter (HOSPITAL_COMMUNITY): Payer: Self-pay | Admitting: *Deleted

## 2015-12-25 DIAGNOSIS — Z79899 Other long term (current) drug therapy: Secondary | ICD-10-CM

## 2015-12-25 DIAGNOSIS — Z87891 Personal history of nicotine dependence: Secondary | ICD-10-CM

## 2015-12-25 DIAGNOSIS — G4730 Sleep apnea, unspecified: Secondary | ICD-10-CM | POA: Diagnosis present

## 2015-12-25 DIAGNOSIS — Z6841 Body Mass Index (BMI) 40.0 and over, adult: Secondary | ICD-10-CM

## 2015-12-25 DIAGNOSIS — R110 Nausea: Secondary | ICD-10-CM | POA: Diagnosis not present

## 2015-12-25 DIAGNOSIS — Z9884 Bariatric surgery status: Secondary | ICD-10-CM

## 2015-12-25 DIAGNOSIS — I10 Essential (primary) hypertension: Secondary | ICD-10-CM | POA: Diagnosis present

## 2015-12-25 HISTORY — PX: LAPAROSCOPIC GASTRIC SLEEVE RESECTION: SHX5895

## 2015-12-25 LAB — CBC
HCT: 34 % — ABNORMAL LOW (ref 36.0–46.0)
HEMOGLOBIN: 11.5 g/dL — AB (ref 12.0–15.0)
MCH: 30.1 pg (ref 26.0–34.0)
MCHC: 33.8 g/dL (ref 30.0–36.0)
MCV: 89 fL (ref 78.0–100.0)
PLATELETS: 271 10*3/uL (ref 150–400)
RBC: 3.82 MIL/uL — ABNORMAL LOW (ref 3.87–5.11)
RDW: 13.9 % (ref 11.5–15.5)
WBC: 13 10*3/uL — ABNORMAL HIGH (ref 4.0–10.5)

## 2015-12-25 LAB — CREATININE, SERUM
CREATININE: 0.84 mg/dL (ref 0.44–1.00)
GFR calc Af Amer: >60 mL/min (ref >60)
GFR calc non Af Amer: >60 mL/min (ref >60)

## 2015-12-25 LAB — PREGNANCY, URINE: PREG TEST UR: NEGATIVE

## 2015-12-25 SURGERY — GASTRECTOMY, SLEEVE, LAPAROSCOPIC
Anesthesia: General | Site: Abdomen

## 2015-12-25 MED ORDER — KCL IN DEXTROSE-NACL 20-5-0.45 MEQ/L-%-% IV SOLN
INTRAVENOUS | Status: DC
  Administered 2015-12-25: 125 mL/h via INTRAVENOUS
  Administered 2015-12-26 (×2): via INTRAVENOUS
  Administered 2015-12-27: 1000 mL via INTRAVENOUS
  Filled 2015-12-25 (×6): qty 1000

## 2015-12-25 MED ORDER — ROCURONIUM BROMIDE 50 MG/5ML IV SOSY
PREFILLED_SYRINGE | INTRAVENOUS | Status: AC
  Filled 2015-12-25: qty 5

## 2015-12-25 MED ORDER — PROPOFOL 10 MG/ML IV BOLUS
INTRAVENOUS | Status: AC
  Filled 2015-12-25: qty 20

## 2015-12-25 MED ORDER — LIDOCAINE 2% (20 MG/ML) 5 ML SYRINGE
INTRAMUSCULAR | Status: DC | PRN
  Administered 2015-12-25: 50 mg via INTRAVENOUS

## 2015-12-25 MED ORDER — HEPARIN SODIUM (PORCINE) 5000 UNIT/ML IJ SOLN
5000.0000 [IU] | INTRAMUSCULAR | Status: AC
  Administered 2015-12-25: 5000 [IU] via SUBCUTANEOUS
  Filled 2015-12-25: qty 1

## 2015-12-25 MED ORDER — HYDROMORPHONE HCL 1 MG/ML IJ SOLN: 0.2500 mg | INTRAMUSCULAR | Status: DC | PRN

## 2015-12-25 MED ORDER — ONDANSETRON HCL 4 MG/2ML IJ SOLN
4.0000 mg | INTRAMUSCULAR | Status: DC | PRN
  Administered 2015-12-25 – 2015-12-27 (×5): 4 mg via INTRAVENOUS
  Filled 2015-12-25 (×5): qty 2

## 2015-12-25 MED ORDER — HEPARIN SODIUM (PORCINE) 5000 UNIT/ML IJ SOLN
5000.0000 [IU] | Freq: Three times a day (TID) | INTRAMUSCULAR | Status: DC
  Administered 2015-12-25 – 2015-12-28 (×8): 5000 [IU] via SUBCUTANEOUS
  Filled 2015-12-25 (×7): qty 1

## 2015-12-25 MED ORDER — HYDROMORPHONE HCL 2 MG/ML IJ SOLN
INTRAMUSCULAR | Status: AC
  Administered 2015-12-25: 0.25 mg
  Filled 2015-12-25: qty 1

## 2015-12-25 MED ORDER — ONDANSETRON HCL 4 MG/2ML IJ SOLN
INTRAMUSCULAR | Status: AC
  Filled 2015-12-25: qty 2

## 2015-12-25 MED ORDER — LACTATED RINGERS IR SOLN
Status: DC | PRN
  Administered 2015-12-25: 3000 mL

## 2015-12-25 MED ORDER — FENTANYL CITRATE (PF) 250 MCG/5ML IJ SOLN
INTRAMUSCULAR | Status: AC
  Filled 2015-12-25: qty 5

## 2015-12-25 MED ORDER — APREPITANT 80 MG PO CAPS
80.0000 mg | ORAL_CAPSULE | ORAL | Status: AC
  Administered 2015-12-25: 80 mg via ORAL
  Filled 2015-12-25: qty 1

## 2015-12-25 MED ORDER — ONDANSETRON HCL 4 MG/2ML IJ SOLN
INTRAMUSCULAR | Status: DC | PRN
  Administered 2015-12-25: 4 mg via INTRAVENOUS

## 2015-12-25 MED ORDER — FENTANYL CITRATE (PF) 100 MCG/2ML IJ SOLN
INTRAMUSCULAR | Status: DC | PRN
  Administered 2015-12-25 (×6): 50 ug via INTRAVENOUS
  Administered 2015-12-25: 100 ug via INTRAVENOUS
  Administered 2015-12-25 (×2): 50 ug via INTRAVENOUS

## 2015-12-25 MED ORDER — LIDOCAINE 2% (20 MG/ML) 5 ML SYRINGE
INTRAMUSCULAR | Status: AC
  Filled 2015-12-25: qty 5

## 2015-12-25 MED ORDER — PREMIER PROTEIN SHAKE
2.0000 [oz_av] | ORAL | Status: DC
  Administered 2015-12-27 – 2015-12-28 (×11): 2 [oz_av] via ORAL
  Filled 2015-12-25: qty 325.31

## 2015-12-25 MED ORDER — ONDANSETRON HCL 4 MG/2ML IJ SOLN
4.0000 mg | Freq: Once | INTRAMUSCULAR | Status: DC | PRN
Start: 2015-12-25 — End: 2015-12-25

## 2015-12-25 MED ORDER — PROPOFOL 10 MG/ML IV BOLUS
INTRAVENOUS | Status: DC | PRN
  Administered 2015-12-25: 200 mg via INTRAVENOUS

## 2015-12-25 MED ORDER — SODIUM CHLORIDE 0.9 % IJ SOLN
INTRAMUSCULAR | Status: AC
  Filled 2015-12-25: qty 10

## 2015-12-25 MED ORDER — ACETAMINOPHEN 160 MG/5ML PO SOLN: 650.0000 mg | ORAL | Status: DC | PRN

## 2015-12-25 MED ORDER — MIDAZOLAM HCL 2 MG/2ML IJ SOLN
INTRAMUSCULAR | Status: AC
  Filled 2015-12-25: qty 2

## 2015-12-25 MED ORDER — BUPIVACAINE LIPOSOME 1.3 % IJ SUSP
20.0000 mL | Freq: Once | INTRAMUSCULAR | Status: AC
  Administered 2015-12-25: 20 mL
  Filled 2015-12-25: qty 20

## 2015-12-25 MED ORDER — SCOPOLAMINE 1 MG/3DAYS TD PT72
1.0000 | MEDICATED_PATCH | TRANSDERMAL | Status: DC
  Administered 2015-12-25: 1.5 mg via TRANSDERMAL
  Filled 2015-12-25 (×2): qty 1

## 2015-12-25 MED ORDER — PROMETHAZINE HCL 25 MG/ML IJ SOLN
12.5000 mg | Freq: Four times a day (QID) | INTRAMUSCULAR | Status: DC | PRN
  Administered 2015-12-25: 12.5 mg via INTRAVENOUS
  Filled 2015-12-25: qty 1

## 2015-12-25 MED ORDER — PANTOPRAZOLE SODIUM 40 MG IV SOLR
40.0000 mg | Freq: Every day | INTRAVENOUS | Status: DC
  Administered 2015-12-25 – 2015-12-27 (×3): 40 mg via INTRAVENOUS
  Filled 2015-12-25 (×3): qty 40

## 2015-12-25 MED ORDER — SODIUM CHLORIDE 0.9 % IJ SOLN
INTRAMUSCULAR | Status: DC | PRN
  Administered 2015-12-25: 10 mL

## 2015-12-25 MED ORDER — SUGAMMADEX SODIUM 500 MG/5ML IV SOLN
INTRAVENOUS | Status: DC | PRN
  Administered 2015-12-25: 300 mg via INTRAVENOUS

## 2015-12-25 MED ORDER — CEFOTETAN DISODIUM-DEXTROSE 2-2.08 GM-% IV SOLR
2.0000 g | INTRAVENOUS | Status: AC
  Administered 2015-12-25: 2 g via INTRAVENOUS

## 2015-12-25 MED ORDER — OXYCODONE HCL 5 MG/5ML PO SOLN
5.0000 mg | ORAL | Status: DC | PRN
  Administered 2015-12-26 (×3): 5 mg via ORAL
  Administered 2015-12-27: 10 mg via ORAL
  Filled 2015-12-25 (×2): qty 5
  Filled 2015-12-25: qty 10
  Filled 2015-12-25: qty 5

## 2015-12-25 MED ORDER — PROMETHAZINE HCL 25 MG/ML IJ SOLN
6.2500 mg | Freq: Once | INTRAMUSCULAR | Status: AC
  Administered 2015-12-25: 6.25 mg via INTRAVENOUS

## 2015-12-25 MED ORDER — CEFOTETAN DISODIUM-DEXTROSE 2-2.08 GM-% IV SOLR
INTRAVENOUS | Status: AC
  Filled 2015-12-25: qty 50

## 2015-12-25 MED ORDER — MEPERIDINE HCL 50 MG/ML IJ SOLN: 6.2500 mg | INTRAMUSCULAR | Status: DC | PRN

## 2015-12-25 MED ORDER — LACTATED RINGERS IV SOLN
INTRAVENOUS | Status: DC
  Administered 2015-12-25: 11:00:00 via INTRAVENOUS
  Administered 2015-12-25: 1000 mL via INTRAVENOUS

## 2015-12-25 MED ORDER — PROMETHAZINE HCL 25 MG/ML IJ SOLN
INTRAMUSCULAR | Status: AC
  Administered 2015-12-25: 6.25 mg via INTRAVENOUS
  Filled 2015-12-25: qty 1

## 2015-12-25 MED ORDER — ACETAMINOPHEN 160 MG/5ML PO SOLN: 325.0000 mg | ORAL | Status: DC | PRN

## 2015-12-25 MED ORDER — ROCURONIUM BROMIDE 10 MG/ML (PF) SYRINGE
PREFILLED_SYRINGE | INTRAVENOUS | Status: DC | PRN
  Administered 2015-12-25 (×2): 10 mg via INTRAVENOUS
  Administered 2015-12-25: 50 mg via INTRAVENOUS
  Administered 2015-12-25 (×2): 10 mg via INTRAVENOUS

## 2015-12-25 MED ORDER — 0.9 % SODIUM CHLORIDE (POUR BTL) OPTIME
TOPICAL | Status: DC | PRN
  Administered 2015-12-25: 1000 mL

## 2015-12-25 MED ORDER — DEXAMETHASONE SODIUM PHOSPHATE 10 MG/ML IJ SOLN
INTRAMUSCULAR | Status: AC
  Filled 2015-12-25: qty 1

## 2015-12-25 MED ORDER — MORPHINE SULFATE (PF) 2 MG/ML IV SOLN
2.0000 mg | INTRAVENOUS | Status: DC | PRN
  Administered 2015-12-25 – 2015-12-26 (×2): 2 mg via INTRAVENOUS
  Filled 2015-12-25 (×2): qty 1

## 2015-12-25 SURGICAL SUPPLY — 72 items
ADH SKN CLS APL DERMABOND .7 (GAUZE/BANDAGES/DRESSINGS) ×1
APL SRG 32X5 SNPLK LF DISP (MISCELLANEOUS)
APPLICATOR COTTON TIP 6IN STRL (MISCELLANEOUS) IMPLANT
APPLIER CLIP 5 13 M/L LIGAMAX5 (MISCELLANEOUS)
APPLIER CLIP ROT 10 11.4 M/L (STAPLE)
APPLIER CLIP ROT 13.4 12 LRG (CLIP)
APR CLP LRG 13.4X12 ROT 20 MLT (CLIP)
APR CLP MED LRG 11.4X10 (STAPLE)
APR CLP MED LRG 5 ANG JAW (MISCELLANEOUS)
BAG SPEC RTRVL LRG 6X4 10 (ENDOMECHANICALS)
BLADE SURG 15 STRL LF DISP TIS (BLADE) ×1 IMPLANT
BLADE SURG 15 STRL SS (BLADE) ×2
CABLE HIGH FREQUENCY MONO STRZ (ELECTRODE) ×2 IMPLANT
CLIP APPLIE 5 13 M/L LIGAMAX5 (MISCELLANEOUS) IMPLANT
CLIP APPLIE ROT 10 11.4 M/L (STAPLE) IMPLANT
CLIP APPLIE ROT 13.4 12 LRG (CLIP) IMPLANT
CLIP SUT LAPRA TY ABSORB (SUTURE) ×1 IMPLANT
COVER SURGICAL LIGHT HANDLE (MISCELLANEOUS) ×2 IMPLANT
DERMABOND ADVANCED (GAUZE/BANDAGES/DRESSINGS) ×1
DERMABOND ADVANCED .7 DNX12 (GAUZE/BANDAGES/DRESSINGS) IMPLANT
DEVICE SUT QUICK LOAD TK 5 (STAPLE) IMPLANT
DEVICE SUT TI-KNOT TK 5X26 (MISCELLANEOUS) IMPLANT
DEVICE SUTURE ENDOST 10MM (ENDOMECHANICALS) ×1 IMPLANT
DEVICE TROCAR PUNCTURE CLOSURE (ENDOMECHANICALS) ×2 IMPLANT
DISSECTOR BLUNT TIP ENDO 5MM (MISCELLANEOUS) IMPLANT
ELECT REM PT RETURN 9FT ADLT (ELECTROSURGICAL) ×2
ELECTRODE REM PT RTRN 9FT ADLT (ELECTROSURGICAL) ×1 IMPLANT
ENDOSTITCH 0 SINGLE 48 (SUTURE) ×1 IMPLANT
GAUZE SPONGE 4X4 12PLY STRL (GAUZE/BANDAGES/DRESSINGS) IMPLANT
GLOVE BIOGEL M 8.0 STRL (GLOVE) ×2 IMPLANT
GOWN STRL REUS W/TWL XL LVL3 (GOWN DISPOSABLE) ×8 IMPLANT
HANDLE STAPLE EGIA 4 XL (STAPLE) ×2 IMPLANT
HOVERMATT SINGLE USE (MISCELLANEOUS) ×2 IMPLANT
IRRIG SUCT STRYKERFLOW 2 WTIP (MISCELLANEOUS) ×2
IRRIGATION SUCT STRKRFLW 2 WTP (MISCELLANEOUS) ×1 IMPLANT
KIT BASIN OR (CUSTOM PROCEDURE TRAY) ×2 IMPLANT
MARKER SKIN DUAL TIP RULER LAB (MISCELLANEOUS) ×2 IMPLANT
NDL SPNL 22GX3.5 QUINCKE BK (NEEDLE) ×1 IMPLANT
NEEDLE SPNL 22GX3.5 QUINCKE BK (NEEDLE) ×2 IMPLANT
PACK UNIVERSAL I (CUSTOM PROCEDURE TRAY) ×2 IMPLANT
POUCH SPECIMEN RETRIEVAL 10MM (ENDOMECHANICALS) IMPLANT
RELOAD STAPLE 45 PURP MED/THCK (STAPLE) IMPLANT
RELOAD TRI 45 ART MED THCK BLK (STAPLE) ×2 IMPLANT
RELOAD TRI 45 ART MED THCK PUR (STAPLE) IMPLANT
RELOAD TRI 60 ART MED THCK BLK (STAPLE) ×2 IMPLANT
RELOAD TRI 60 ART MED THCK PUR (STAPLE) ×4 IMPLANT
SCISSORS LAP 5X45 EPIX DISP (ENDOMECHANICALS) IMPLANT
SEALANT SURGICAL APPL DUAL CAN (MISCELLANEOUS) IMPLANT
SHEARS HARMONIC ACE PLUS 45CM (MISCELLANEOUS) ×2 IMPLANT
SLEEVE ADV FIXATION 5X100MM (TROCAR) ×4 IMPLANT
SLEEVE GASTRECTOMY 36FR VISIGI (MISCELLANEOUS) ×2 IMPLANT
SOLUTION ANTI FOG 6CC (MISCELLANEOUS) ×2 IMPLANT
SPONGE LAP 18X18 X RAY DECT (DISPOSABLE) ×2 IMPLANT
STAPLER VISISTAT 35W (STAPLE) ×2 IMPLANT
SUT SURGIDAC NAB ES-9 0 48 120 (SUTURE) IMPLANT
SUT VIC AB 4-0 SH 18 (SUTURE) ×2 IMPLANT
SUT VICRYL 0 TIES 12 18 (SUTURE) ×2 IMPLANT
SYR 10ML ECCENTRIC (SYRINGE) ×2 IMPLANT
SYR 20CC LL (SYRINGE) ×2 IMPLANT
SYR 50ML LL SCALE MARK (SYRINGE) ×2 IMPLANT
SYSTEM WECK SHIELD CLOSURE (TROCAR) IMPLANT
TOWEL OR 17X26 10 PK STRL BLUE (TOWEL DISPOSABLE) ×4 IMPLANT
TOWEL OR NON WOVEN STRL DISP B (DISPOSABLE) ×2 IMPLANT
TRAY FOLEY W/METER SILVER 16FR (SET/KITS/TRAYS/PACK) IMPLANT
TROCAR ADV FIXATION 12X100MM (TROCAR) ×3 IMPLANT
TROCAR ADV FIXATION 5X100MM (TROCAR) ×2 IMPLANT
TROCAR BLADELESS 15MM (ENDOMECHANICALS) ×2 IMPLANT
TROCAR BLADELESS OPT 5 100 (ENDOMECHANICALS) ×2 IMPLANT
TUBE CALIBRATION LAPBAND (TUBING) IMPLANT
TUBING CONNECTING 10 (TUBING) ×2 IMPLANT
TUBING ENDO SMARTCAP (MISCELLANEOUS) ×2 IMPLANT
TUBING INSUF HEATED (TUBING) ×2 IMPLANT

## 2015-12-25 NOTE — Discharge Instructions (Addendum)

## 2015-12-25 NOTE — Transfer of Care (Signed)
Immediate Anesthesia Transfer of Care Note  Patient: Kelsey Zavala  Procedure(s) Performed: Procedure(s): LAPAROSCOPIC GASTRIC SLEEVE RESECTION, UPPER ENDO (N/A)  Patient Location: PACU  Anesthesia Type:General  Level of Consciousness:  sedated, patient cooperative and responds to stimulation  Airway & Oxygen Therapy:Patient Spontanous Breathing and Patient connected to face mask oxgen  Post-op Assessment:  Report given to PACU RN and Post -op Vital signs reviewed and stable  Post vital signs:  Reviewed and stable  Last Vitals:  Vitals:   12/25/15 0816 12/25/15 1150  BP: 135/83   Pulse: 79   Resp: 16 (P) 19  Temp: 36.8 C (P) 36.7 C    Complications: No apparent anesthesia complications

## 2015-12-25 NOTE — Anesthesia Postprocedure Evaluation (Signed)
Anesthesia Post Note  Patient: Kelsey Zavala L Mccoll  Procedure(s) Performed: Procedure(s) (LRB): LAPAROSCOPIC GASTRIC SLEEVE RESECTION, UPPER ENDO (N/A)  Patient location during evaluation: PACU Anesthesia Type: General Level of consciousness: awake and alert Pain management: pain level controlled Vital Signs Assessment: post-procedure vital signs reviewed and stable Respiratory status: spontaneous breathing, nonlabored ventilation, respiratory function stable and patient connected to nasal cannula oxygen Cardiovascular status: blood pressure returned to baseline and stable Postop Assessment: no signs of nausea or vomiting Anesthetic complications: no       Last Vitals:  Vitals:   12/25/15 1230 12/25/15 1245  BP: (!) 154/81 (!) 150/83  Pulse: 79 80  Resp: 15 14  Temp:      Last Pain:  Vitals:   12/25/15 1245  TempSrc:   PainSc: Asleep                 [name]

## 2015-12-25 NOTE — Anesthesia Preprocedure Evaluation (Signed)
Anesthesia Evaluation  Patient identified by MRN, date of birth, ID band Patient awake    Reviewed: Allergy & Precautions, NPO status , Patient's Chart, lab work & pertinent test results  Airway Mallampati: I  TM Distance: >3 FB Neck ROM: Full    Dental   Pulmonary    Pulmonary exam normal        Cardiovascular hypertension, Pt. on medications Normal cardiovascular exam     Neuro/Psych    GI/Hepatic   Endo/Other    Renal/GU      Musculoskeletal   Abdominal   Peds  Hematology   Anesthesia Other Findings   Reproductive/Obstetrics                             Anesthesia Physical Anesthesia Plan  ASA: III  Anesthesia Plan: General   Post-op Pain Management:    Induction: Intravenous  Airway Management Planned: Oral ETT  Additional Equipment:   Intra-op Plan:   Post-operative Plan: Extubation in OR  Informed Consent: I have reviewed the patients History and Physical, chart, labs and discussed the procedure including the risks, benefits and alternatives for the proposed anesthesia with the patient or authorized representative who has indicated his/her understanding and acceptance.     Plan Discussed with: CRNA and Surgeon  Anesthesia Plan Comments:         Anesthesia Quick Evaluation  

## 2015-12-25 NOTE — Op Note (Signed)
Surgeon: Wenda LowMatt Lucciano Vitali, MD, FACS  Asst:  none  Anes:  General endotracheal  Procedure: Laparoscopic sleeve gastrectomy and upper endoscopy  Diagnosis: Morbid obesity  Complications: None noted  EBL:   5 cc  Description of Procedure:  The patient was take to OR 1 and given general anesthesia.  The abdomen was prepped with Technicare and draped sterilely.  A timeout was performed.  Access to the abdomen was achieved with a 5 mm Optiview through the left upper quadrant.  Following insufflation, the state of the abdomen was found to be free of adhesions except an omental plug in an umbilical hernia that was small.  The calibration tube was inserted no hiatal hernia was detected.  The ViSiGi 36Fr tube was inserted to deflate the stomach and was pulled back into the esophagus.    The pylorus was identified and we measured 5 cm back and marked the antrum.  At that point we began dissection to take down the greater curvature of the stomach using the Harmonic scalpel.  This dissection was taken all the way up to the left crus.  Posterior attachments of the stomach were also taken down.    The ViSiGi tube was then passed into the antrum and suction applied so that it was snug along the lessor curvature.  The "crow's foot" or incisura was identified.  The sleeve gastrectomy was begun using the [employer] platform stapler beginning with a 4.5 cm black load with TRS followed by a 6 cm black load with TRS.  The sleeve was completed with multiple firings of the purple loads with TRS.  When the sleeve was complete the tube was taken off suction and insufflated briefly.  The tube was withdrawn.  Upper endoscopy was then performed by me.  The scope was passed to the pylorous. No bleeding or bubbles were noted.  With the duodenum inflated the small bowel could be seen through the mesentery defect and this was closed with a 0 vicryl and a laparotie.  .     The specimen was extracted through the 15 trocar site.   Wounds were infiltrated with Exparel and closed with 4-0 Monocryl.    Kelsey B. Daphine DeutscherMartin, MD, [facility] [place] [phone]

## 2015-12-25 NOTE — Interval H&P Note (Signed)
History and Physical Interval Note:  12/25/2015 9:26 AM  Kelsey Zavala  has presented today for surgery, with the diagnosis of Morbid Obesity, OSA  The various methods of treatment have been discussed with the patient and family. After consideration of risks, benefits and other options for treatment, the patient has consented to  Procedure(s): LAPAROSCOPIC GASTRIC SLEEVE RESECTION, UPPER ENDO (N/A) as a surgical intervention .  The patient's history has been reviewed, patient examined, no change in status, stable for surgery.  I have reviewed the patient's chart and labs.  Questions were answered to the patient's satisfaction.  I saw her on the third floor and performed a PE.  I answered questions and reiterated to her that I did not have a bariatric assistant today and she was comfortable moving forward.     [name]

## 2015-12-25 NOTE — Anesthesia Procedure Notes (Addendum)
Procedure Name: Intubation Date/Time: 12/25/2015 9:53 AM Performed by: Paris LoreBLANTON, Ulis Kaps M Pre-anesthesia Checklist: Patient identified, Emergency Drugs available, Suction available, Patient being monitored and Timeout performed Patient Re-evaluated:Patient Re-evaluated prior to inductionOxygen Delivery Method: Circle system utilized Preoxygenation: Pre-oxygenation with 100% oxygen Intubation Type: IV induction Ventilation: Mask ventilation without difficulty Laryngoscope Size: Mac and 4 Tube type: Oral Tube size: 7.5 mm Number of attempts: 1 Airway Equipment and Method: Stylet Placement Confirmation: ETT inserted through vocal cords under direct vision,  positive ETCO2,  CO2 detector and breath sounds checked- equal and bilateral Secured at: 21 cm Tube secured with: Tape Dental Injury: Teeth and Oropharynx as per pre-operative assessment

## 2015-12-26 LAB — CBC WITH DIFFERENTIAL/PLATELET
BASOS ABS: 0 10*3/uL (ref 0.0–0.1)
Basophils Relative: 0 %
EOS ABS: 0 10*3/uL (ref 0.0–0.7)
EOS PCT: 0 %
HCT: 34.5 % — ABNORMAL LOW (ref 36.0–46.0)
Hemoglobin: 11.6 g/dL — ABNORMAL LOW (ref 12.0–15.0)
LYMPHS PCT: 10 %
Lymphs Abs: 0.8 10*3/uL (ref 0.7–4.0)
MCH: 29.4 pg (ref 26.0–34.0)
MCHC: 33.6 g/dL (ref 30.0–36.0)
MCV: 87.3 fL (ref 78.0–100.0)
MONO ABS: 0.4 10*3/uL (ref 0.1–1.0)
Monocytes Relative: 5 %
Neutro Abs: 6.8 10*3/uL (ref 1.7–7.7)
Neutrophils Relative %: 85 %
PLATELETS: 286 10*3/uL (ref 150–400)
RBC: 3.95 MIL/uL (ref 3.87–5.11)
RDW: 13.5 % (ref 11.5–15.5)
WBC: 8 10*3/uL (ref 4.0–10.5)

## 2015-12-26 LAB — HEMOGLOBIN AND HEMATOCRIT, BLOOD
HCT: 36.5 % (ref 36.0–46.0)
HEMOGLOBIN: 12.2 g/dL (ref 12.0–15.0)

## 2015-12-26 NOTE — Plan of Care (Signed)
Problem: Food- and Nutrition-Related Knowledge Deficit (NB-1.1) Goal: Nutrition education Formal process to instruct or train a patient/client in a skill or to impart knowledge to help patients/clients voluntarily manage or modify food choices and eating behavior to maintain or improve health. Outcome: Completed/Met Date Met: 12/26/15 Nutrition Education Note  Received consult for diet education per DROP protocol.   Discussed 2 week post op diet with pt. Emphasized that liquids must be non carbonated, non caffeinated, and sugar free. Fluid goals discussed. Reviewed progression of diet to include soft proteins at 7-10 days post-op. Pt to follow up with outpatient bariatric RD for further diet progression after 2 weeks. Multivitamins and minerals also reviewed. Teach back method used, pt expressed understanding, expect good compliance.   Diet: First 2 Weeks  You will see the dietitian about two (2) weeks after your surgery. The dietitian will increase the types of foods you can eat if you are handling liquids well:  If you have severe vomiting or nausea and cannot handle clear liquids lasting longer than 1 day, call your surgeon  Protein Shake  Drink at least 2 ounces of shake 5-6 times per day  Each serving of protein shakes (usually 8 - 12 ounces) should have a minimum of:  15 grams of protein  And no more than 5 grams of carbohydrate  Goal for protein each day:  Men = 80 grams per day  Women = 60 grams per day  Protein powder may be added to fluids such as non-fat milk or Lactaid milk or Soy milk (limit to 35 grams added protein powder per serving)   Hydration  Slowly increase the amount of water and other clear liquids as tolerated (See Acceptable Fluids)  Slowly increase the amount of protein shake as tolerated  Sip fluids slowly and throughout the day  May use sugar substitutes in small amounts (no more than 6 - 8 packets per day; i.e. Splenda)   Fluid Goal  The first goal is to  drink at least 8 ounces of protein shake/drink per day (or as directed by the nutritionist); some examples of protein shakes are [employer], AMR [employer], EAS Edge HP, and Unjury. See handout from pre-op Bariatric Education Class:  Slowly increase the amount of protein shake you drink as tolerated  You may find it easier to slowly sip shakes throughout the day  It is important to get your proteins in first  Your fluid goal is to drink 64 - 100 ounces of fluid daily  It may take a few weeks to build up to this  32 oz (or more) should be clear liquids  And  32 oz (or more) should be full liquids (see below for examples)  Liquids should not contain sugar, caffeine, or carbonation   Clear Liquids:  Water or Sugar-free flavored water (i.e. Fruit H2O, Propel)  Decaffeinated coffee or tea (sugar-free)  Crystal Lite, Wyler's Lite, Minute Maid Lite  Sugar-free Jell-O  Bouillon or broth  Sugar-free Popsicle: *Less than 20 calories each; Limit 1 per day   Full Liquids:  Protein Shakes/Drinks + 2 choices per day of other full liquids  Full liquids must be:  No More Than 12 grams of Carbs per serving  No More Than 3 grams of Fat per serving  Strained low-fat cream soup  Non-Fat milk  Fat-free Lactaid Milk  Sugar-free yogurt (Dannon Lite & Fit, Greek yogurt)     Kelsey Bibles, MS, RD, LDN Pager: [phone] After Hours Pager: [phone]

## 2015-12-26 NOTE — Progress Notes (Signed)
Patient ID: Kelsey Zavala, female   DOB: 22-Oct-1975, 40 y.o.   MRN: [id number] [facility] Surgery Progress Note:   1 Day Post-Op  Subjective: Mental status is clear.  Drinking slowly Objective: Vital signs in last 24 hours: Temp:  [97.8 F (36.6 C)-100.1 F (37.8 C)] 98.4 F (36.9 C) (12/21 1000) Pulse Rate:  [70-91] 73 (12/21 1000) Resp:  [14-19] 18 (12/21 1000) BP: (148-170)/(74-99) 164/76 (12/21 1000) SpO2:  [97 %-100 %] 100 % (12/21 1000)  Intake/Output from previous day: 12/20 0701 - 12/21 0700 In: 1510 [P.O.:160; I.V.:1350] Out: 6130 [Urine:6080; Blood:50] Intake/Output this shift: Total I/O In: -  Out: 750 [Urine:750]  Physical Exam: Work of breathing is not labored.  In bed.  Feels tired  Lab Results:  Results for orders placed or performed during the hospital encounter of 12/25/15 (from the past 48 hour(s))  Pregnancy, urine STAT morning of surgery     Status: None   Collection Time: 12/25/15  7:47 AM  Result Value Ref Range   Preg Test, Ur NEGATIVE NEGATIVE    Comment:        THE SENSITIVITY OF THIS METHODOLOGY IS >20 mIU/mL.   CBC     Status: Abnormal   Collection Time: 12/25/15 12:29 PM  Result Value Ref Range   WBC 13.0 (H) 4.0 - 10.5 K/uL   RBC 3.82 (L) 3.87 - 5.11 MIL/uL   Hemoglobin 11.5 (L) 12.0 - 15.0 g/dL   HCT 34.0 (L) 36.0 - 46.0 %   MCV 89.0 78.0 - 100.0 fL   MCH 30.1 26.0 - 34.0 pg   MCHC 33.8 30.0 - 36.0 g/dL   RDW 13.9 11.5 - 15.5 %   Platelets 271 150 - 400 K/uL  Creatinine, serum     Status: None   Collection Time: 12/25/15 12:29 PM  Result Value Ref Range   Creatinine, Ser 0.84 0.44 - 1.00 mg/dL   GFR calc non Af Amer >60 >60 mL/min   GFR calc Af Amer >60 >60 mL/min    Comment: (NOTE) The eGFR has been calculated using the CKD EPI equation. This calculation has not been validated in all clinical situations. eGFR's persistently <60 mL/min signify possible Chronic Kidney Disease.   CBC WITH DIFFERENTIAL     Status: Abnormal   Collection Time: 12/26/15  4:40 AM  Result Value Ref Range   WBC 8.0 4.0 - 10.5 K/uL   RBC 3.95 3.87 - 5.11 MIL/uL   Hemoglobin 11.6 (L) 12.0 - 15.0 g/dL   HCT 34.5 (L) 36.0 - 46.0 %   MCV 87.3 78.0 - 100.0 fL   MCH 29.4 26.0 - 34.0 pg   MCHC 33.6 30.0 - 36.0 g/dL   RDW 13.5 11.5 - 15.5 %   Platelets 286 150 - 400 K/uL   Neutrophils Relative % 85 %   Neutro Abs 6.8 1.7 - 7.7 K/uL   Lymphocytes Relative 10 %   Lymphs Abs 0.8 0.7 - 4.0 K/uL   Monocytes Relative 5 %   Monocytes Absolute 0.4 0.1 - 1.0 K/uL   Eosinophils Relative 0 %   Eosinophils Absolute 0.0 0.0 - 0.7 K/uL   Basophils Relative 0 %   Basophils Absolute 0.0 0.0 - 0.1 K/uL    Radiology/Results: No results found.  Anti-infectives: Anti-infectives    Start     Dose/Rate Route Frequency Ordered Stop   12/25/15 0746  cefoTEtan in Dextrose 5% (CEFOTAN) IVPB 2 g     2 g Intravenous On call  to O.R. 12/25/15 0746 12/25/15 1003      Assessment/Plan: Problem List: Patient Active Problem List   Diagnosis Date Noted  . S/P laparoscopic sleeve gastrectomy Dec 2017 12/25/2015  . Itching 06/20/2015  . Vaginal yeast infection 06/11/2015  . Abnormal Pap smear of cervix 04/04/2015  . Meralgia paraesthetica 11/16/2014  . Dysmenorrhea 08/13/2014  . Grief reaction 08/13/2014  . Flank pain, acute 05/24/2014  . Morbid obesity ([place]) 03/29/2014  . Edema 03/03/2014  . Polymyalgia ([place]) 12/13/2013  . Essential hypertension, benign 09/26/2013  . Wellness examination 09/26/2013  . Acne 02/23/2011  . Other and unspecified ovarian cyst 09/25/2008  . PROTEINURIA, MILD 09/25/2008  . Genital herpes 08/16/2006  . Obesity 03/04/2006  . TOBACCO USE, QUIT 03/04/2006    Will try to progress clears to shakes.   1 Day Post-Op    LOS: 1 day   Matt B. Hassell Done, MD, [facility] [phone] beeper [phone]  12/26/2015 11:21 AM

## 2015-12-26 NOTE — Progress Notes (Signed)
Pt is nauseous with water and and protein shakes. Patient tried on unjury chicken soup flavor, seems to tolerate better.  Zofran given, will continue to monitor.    Jaelin Fackler, RN

## 2015-12-27 LAB — CBC WITH DIFFERENTIAL/PLATELET
Basophils Absolute: 0 10*3/uL (ref 0.0–0.1)
Basophils Relative: 0 %
EOS ABS: 0.1 10*3/uL (ref 0.0–0.7)
Eosinophils Relative: 1 %
HEMATOCRIT: 33.5 % — AB (ref 36.0–46.0)
HEMOGLOBIN: 11.3 g/dL — AB (ref 12.0–15.0)
LYMPHS ABS: 1.6 10*3/uL (ref 0.7–4.0)
LYMPHS PCT: 22 %
MCH: 30.3 pg (ref 26.0–34.0)
MCHC: 33.7 g/dL (ref 30.0–36.0)
MCV: 89.8 fL (ref 78.0–100.0)
Monocytes Absolute: 0.6 10*3/uL (ref 0.1–1.0)
Monocytes Relative: 8 %
NEUTROS ABS: 5 10*3/uL (ref 1.7–7.7)
NEUTROS PCT: 69 %
Platelets: 273 10*3/uL (ref 150–400)
RBC: 3.73 MIL/uL — AB (ref 3.87–5.11)
RDW: 13.9 % (ref 11.5–15.5)
WBC: 7.3 10*3/uL (ref 4.0–10.5)

## 2015-12-27 NOTE — Progress Notes (Signed)
[facility] Office:  [phone] General Surgery Progress Note   LOS: 2 days  POD -  2 Days Post-Op  Assessment/Plan: 1.  LAPAROSCOPIC GASTRIC SLEEVE RESECTION, UPPER ENDO - 12/25/2015 - D. Kelvis Berger  Significant nausea - has scopolamine patch, on protonix, on Zofran  Clinically looks okay with normal WBC and PE.  Will keep one more day   2.  HTN 3.  Sleep apnea 4.  DVT prophylaxis - SQ Heparin   Active Problems:   S/P laparoscopic sleeve gastrectomy Dec 2017  Subjective:  Has significant nausea when she walks.  Is anxious about going home.   Feels okay laying in bed.  Objective:   Vitals:   12/27/15 0235 12/27/15 0523  BP: (!) 152/77 (!) 151/82  Pulse: 75 69  Resp: 18 18  Temp: 98.6 F (37 C) 98.8 F (37.1 C)     Intake/Output from previous day:  12/21 0701 - 12/22 0700 In: 4432.1 [P.O.:180; I.V.:4252.1] Out: 2450 [Urine:2450]  Intake/Output this shift:  No intake/output data recorded.   Physical Exam:   General: Obese AA F who is alert and oriented.    HEENT: Normal. Pupils equal. .   Lungs: Clear.  Pulls 1,400 cc on IS   Abdomen: Soft.  Has BS.  No localized tenderness.   Wound: Clean   Neurologic:  Grossly intact to motor and sensory function.   Psychiatric: Has normal mood and affect.   Lab Results:    Recent Labs  12/26/15 0440 12/26/15 1604 12/27/15 0439  WBC 8.0  --  7.3  HGB 11.6* 12.2 11.3*  HCT 34.5* 36.5 33.5*  PLT 286  --  273    BMET   Recent Labs  12/25/15 1229  CREATININE 0.84    PT/INR  No results for input(s): LABPROT, INR in the last 72 hours.  ABG  No results for input(s): PHART, HCO3 in the last 72 hours.  Invalid input(s): PCO2, PO2   Studies/Results:  No results found.   Anti-infectives:   Anti-infectives    Start     Dose/Rate Route Frequency Ordered Stop   12/25/15 0746  cefoTEtan in Dextrose 5% (CEFOTAN) IVPB 2 g     2 g Intravenous On call to O.R. 12/25/15 0746 12/25/15 1003      Ovidio Kinavid  Khup Sapia, MD, FACS Pager: [phone] [facility] Office: [phone] 12/27/2015

## 2015-12-27 NOTE — Progress Notes (Signed)
12/27/15 1553  Encouraged patient to drink primere shake and to walk the hallways. Patient sitting straight up in bed Bangladeshindian style using cellphone.

## 2015-12-27 NOTE — Progress Notes (Signed)
12/27/15  1705  Patient still has 8oz of primere left to drink before 1800. Patient did walk one lap.

## 2015-12-28 LAB — BASIC METABOLIC PANEL
ANION GAP: 7 (ref 5–15)
BUN: 10 mg/dL (ref 6–20)
CALCIUM: 8.8 mg/dL — AB (ref 8.9–10.3)
CHLORIDE: 105 mmol/L (ref 101–111)
CO2: 25 mmol/L (ref 22–32)
Creatinine, Ser: 0.72 mg/dL (ref 0.44–1.00)
GFR calc non Af Amer: >60 mL/min (ref >60)
Glucose, Bld: 81 mg/dL (ref 65–99)
POTASSIUM: 3.8 mmol/L (ref 3.5–5.1)
Sodium: 137 mmol/L (ref 135–145)

## 2015-12-28 MED ORDER — ONDANSETRON 4 MG PO TBDP: 4.0000 mg | ORAL_TABLET | Freq: Three times a day (TID) | ORAL | 0 refills | Status: DC | PRN

## 2015-12-28 MED ORDER — PANTOPRAZOLE SODIUM 20 MG PO TBEC: 20.0000 mg | DELAYED_RELEASE_TABLET | Freq: Every day | ORAL | 1 refills | Status: DC

## 2015-12-28 MED ORDER — OXYCODONE HCL 5 MG/5ML PO SOLN: 5.0000 mg | Freq: Four times a day (QID) | ORAL | 0 refills | Status: DC | PRN

## 2015-12-28 NOTE — Discharge Summary (Signed)
Physician Discharge Summary  Patient ID:  Kelsey Zavala  MRN: [id number]  DOB/AGE: [birth year] 40 y.o.  Admit date: 12/25/2015 Discharge date: 12/28/2015  Discharge Diagnoses:  1.  Morbid obesity - weight - 293, BMI - 44.5 2.  HTN 3.  Sleep apnea   Active Problems:   S/P laparoscopic sleeve gastrectomy Dec 2017  Operation: Procedure(s): LAPAROSCOPIC GASTRIC SLEEVE RESECTION, UPPER ENDO on 12/25/2015 - M[facility]  Discharged Condition: good  Hospital Course: Kelsey Zavala is an 40 y.o. female whose primary care physician is Delynn Flavin, DO and who was admitted 12/25/2015 with a chief complaint of morbid obesity.   She was brought to the operating room on 12/25/2015 and underwent  LAPAROSCOPIC GASTRIC SLEEVE RESECTION, UPPER ENDO.  Early, she had trouble with nausea, and thus was not ready to go home. But today, she is doing much better.  She is tolerated the protein drinks and water. She missed her pre op teaching - so she'll need to call our office and the nutritionist to arrange follow up.  The discharge instructions were reviewed with the patient.  Consults: None  Significant Diagnostic Studies: Results for orders placed or performed during the hospital encounter of 12/25/15  CBC WITH DIFFERENTIAL  Result Value Ref Range   WBC 4.5 4.0 - 10.5 K/uL   RBC 3.89 3.87 - 5.11 MIL/uL   Hemoglobin 11.7 (L) 12.0 - 15.0 g/dL   HCT 40.9 81.1 - 91.4 %   MCV 92.5 78.0 - 100.0 fL   MCH 30.1 26.0 - 34.0 pg   MCHC 32.5 30.0 - 36.0 g/dL   RDW 78.2 95.6 - 21.3 %   Platelets 294 150 - 400 K/uL   Neutrophils Relative % 48 %   Neutro Abs 2.2 1.7 - 7.7 K/uL   Lymphocytes Relative 40 %   Lymphs Abs 1.8 0.7 - 4.0 K/uL   Monocytes Relative 8 %   Monocytes Absolute 0.4 0.1 - 1.0 K/uL   Eosinophils Relative 4 %   Eosinophils Absolute 0.2 0.0 - 0.7 K/uL   Basophils Relative 0 %   Basophils Absolute 0.0 0.0 - 0.1 K/uL  Comprehensive metabolic panel  Result Value Ref Range   Sodium 137 135  - 145 mmol/L   Potassium 3.9 3.5 - 5.1 mmol/L   Chloride 106 101 - 111 mmol/L   CO2 26 22 - 32 mmol/L   Glucose, Bld 91 65 - 99 mg/dL   BUN 12 6 - 20 mg/dL   Creatinine, Ser 0.86 0.44 - 1.00 mg/dL   Calcium 8.9 8.9 - 57.8 mg/dL   Total Protein 7.2 6.5 - 8.1 g/dL   Albumin 3.8 3.5 - 5.0 g/dL   AST 23 15 - 41 U/L   ALT 24 14 - 54 U/L   Alkaline Phosphatase 62 38 - 126 U/L   Total Bilirubin 0.6 0.3 - 1.2 mg/dL   GFR calc non Af Amer >60 >60 mL/min   GFR calc Af Amer >60 >60 mL/min   Anion gap 5 5 - 15  Pregnancy, urine STAT morning of surgery  Result Value Ref Range   Preg Test, Ur NEGATIVE NEGATIVE  CBC  Result Value Ref Range   WBC 13.0 (H) 4.0 - 10.5 K/uL   RBC 3.82 (L) 3.87 - 5.11 MIL/uL   Hemoglobin 11.5 (L) 12.0 - 15.0 g/dL   HCT 46.9 (L) 62.9 - 52.8 %   MCV 89.0 78.0 - 100.0 fL   MCH 30.1 26.0 - 34.0 pg  MCHC 33.8 30.0 - 36.0 g/dL   RDW 78.213.9 95.611.5 - 21.315.5 %   Platelets 271 150 - 400 K/uL  Creatinine, serum  Result Value Ref Range   Creatinine, Ser 0.84 0.44 - 1.00 mg/dL   GFR calc non Af Amer >60 >60 mL/min   GFR calc Af Amer >60 >60 mL/min  CBC WITH DIFFERENTIAL  Result Value Ref Range   WBC 8.0 4.0 - 10.5 K/uL   RBC 3.95 3.87 - 5.11 MIL/uL   Hemoglobin 11.6 (L) 12.0 - 15.0 g/dL   HCT 08.634.5 (L) 57.836.0 - 46.946.0 %   MCV 87.3 78.0 - 100.0 fL   MCH 29.4 26.0 - 34.0 pg   MCHC 33.6 30.0 - 36.0 g/dL   RDW 62.913.5 52.811.5 - 41.315.5 %   Platelets 286 150 - 400 K/uL   Neutrophils Relative % 85 %   Neutro Abs 6.8 1.7 - 7.7 K/uL   Lymphocytes Relative 10 %   Lymphs Abs 0.8 0.7 - 4.0 K/uL   Monocytes Relative 5 %   Monocytes Absolute 0.4 0.1 - 1.0 K/uL   Eosinophils Relative 0 %   Eosinophils Absolute 0.0 0.0 - 0.7 K/uL   Basophils Relative 0 %   Basophils Absolute 0.0 0.0 - 0.1 K/uL  Hemoglobin and hematocrit, blood  Result Value Ref Range   Hemoglobin 12.2 12.0 - 15.0 g/dL   HCT 24.436.5 01.036.0 - 27.246.0 %  CBC with Differential  Result Value Ref Range   WBC 7.3 4.0 - 10.5 K/uL    RBC 3.73 (L) 3.87 - 5.11 MIL/uL   Hemoglobin 11.3 (L) 12.0 - 15.0 g/dL   HCT 53.633.5 (L) 64.436.0 - 03.446.0 %   MCV 89.8 78.0 - 100.0 fL   MCH 30.3 26.0 - 34.0 pg   MCHC 33.7 30.0 - 36.0 g/dL   RDW 74.213.9 59.511.5 - 63.815.5 %   Platelets 273 150 - 400 K/uL   Neutrophils Relative % 69 %   Neutro Abs 5.0 1.7 - 7.7 K/uL   Lymphocytes Relative 22 %   Lymphs Abs 1.6 0.7 - 4.0 K/uL   Monocytes Relative 8 %   Monocytes Absolute 0.6 0.1 - 1.0 K/uL   Eosinophils Relative 1 %   Eosinophils Absolute 0.1 0.0 - 0.7 K/uL   Basophils Relative 0 %   Basophils Absolute 0.0 0.0 - 0.1 K/uL  Basic metabolic panel  Result Value Ref Range   Sodium 137 135 - 145 mmol/L   Potassium 3.8 3.5 - 5.1 mmol/L   Chloride 105 101 - 111 mmol/L   CO2 25 22 - 32 mmol/L   Glucose, Bld 81 65 - 99 mg/dL   BUN 10 6 - 20 mg/dL   Creatinine, Ser 7.560.72 0.44 - 1.00 mg/dL   Calcium 8.8 (L) 8.9 - 10.3 mg/dL   GFR calc non Af Amer >60 >60 mL/min   GFR calc Af Amer >60 >60 mL/min   Anion gap 7 5 - 15    Dg Chest 2 View  Result Date: 12/06/2015 CLINICAL DATA:  40 year old female with mid chest pain radiating into each breast since yesterday, with some associated shortness of breath today. EXAM: CHEST  2 VIEW COMPARISON:  Chest x-ray 03/16/2015. FINDINGS: Lung volumes are normal. No consolidative airspace disease. No pleural effusions. No pneumothorax. No pulmonary nodule or mass noted. Pulmonary vasculature and the cardiomediastinal silhouette are within normal limits. IMPRESSION: No radiographic evidence of acute cardiopulmonary disease. Electronically Signed   By: Trudie Reedaniel  Entrikin M.D.   On:  12/06/2015 19:34    Discharge Exam:  Vitals:   12/27/15 2135 12/28/15 0551  BP: (!) 146/72 132/68  Pulse: 73 81  Resp: 18 18  Temp: 98.6 F (37 C) 98.9 F (37.2 C)    General: Obese AA F who is alert and generally healthy appearing.  Lungs: Clear to auscultation and symmetric breath sounds. Heart:  RRR. No murmur or rub. Abdomen: Soft. No  mass. No hernia. Normal bowel sounds.  Incisions look good.  Discharge Medications:   Allergies as of 12/28/2015      Reactions   Norvasc [amlodipine Besylate] Swelling   Other Swelling   MULLIN AND MARSHMALLOW ROOT= SOB, SWELLING      Medication List    TAKE these medications   acetaminophen 325 MG tablet Commonly known as:  TYLENOL Take 650 mg by mouth every 6 (six) hours as needed for moderate pain or headache.   hydrochlorothiazide 25 MG tablet Commonly known as:  HYDRODIURIL Take 1 tablet (25 mg total) by mouth daily. Notes to patient:  Monitor Blood Pressure Daily and keep a log for primary care physician.  Monitor for symptoms of dehydration.  You may need to make changes to your medications with rapid weight loss.     ibuprofen 200 MG tablet Commonly known as:  ADVIL,MOTRIN Take 400 mg by mouth every 6 (six) hours as needed for headache or moderate pain.   lisinopril 10 MG tablet Commonly known as:  PRINIVIL,ZESTRIL Take 1 tablet (10 mg total) by mouth daily.   MULTIVITAMIN ADULTS Tabs Take 1 tablet by mouth daily.   ondansetron 4 MG disintegrating tablet Commonly known as:  ZOFRAN ODT Take 1 tablet (4 mg total) by mouth every 8 (eight) hours as needed for nausea or vomiting.   oxyCODONE 5 MG/5ML solution Commonly known as:  ROXICODONE Take 5-10 mLs (5-10 mg total) by mouth every 6 (six) hours as needed for moderate pain or severe pain.   pantoprazole 20 MG tablet Commonly known as:  PROTONIX Take 1 tablet (20 mg total) by mouth daily.   valACYclovir 500 MG tablet Commonly known as:  VALTREX Take 1 tablet once daily for suppression. Can take 1 tablet by mouth two times a day x 3 days for outbreaks What changed:  how much to take  how to take this  when to take this  reasons to take this  additional instructions       Disposition: 01-Home or Self Care  Discharge Instructions    Ambulate hourly while awake    Complete by:  As directed    Call  MD for:  difficulty breathing, headache or visual disturbances    Complete by:  As directed    Call MD for:  persistant dizziness or light-headedness    Complete by:  As directed    Call MD for:  persistant nausea and vomiting    Complete by:  As directed    Call MD for:  redness, tenderness, or signs of infection (pain, swelling, redness, odor or green/yellow discharge around incision site)    Complete by:  As directed    Call MD for:  severe uncontrolled pain    Complete by:  As directed    Call MD for:  temperature >101 F    Complete by:  As directed    Diet bariatric full liquid    Complete by:  As directed    Incentive spirometry    Complete by:  As directed    Perform hourly  while awake      Follow-up Information    MARTIN,MATTHEW B, MD Follow up on 01/07/2017.   Specialty:  General Surgery Why:  follow up appointment at [facility]  Contact information: [address] [place] [address] [phone]        Valarie MerinoMARTIN,MATTHEW B, MD Follow up.   Specialty:  General Surgery Contact information: [address] [place] [address] [phone]            Signed: Ovidio Kinavid Monita Swier, M.D., [facility] Office:  [phone]  12/28/2015, 9:53 AM

## 2015-12-28 NOTE — Progress Notes (Signed)
Extensive teaching given to pt about diet and post operative care. Pt to call [facility] on Tuesday to ensure that she has all appointments needed scheduled. All questions answered and pt ws pleased with stay.

## 2016-01-03 ENCOUNTER — Encounter: Payer: Self-pay | Admitting: Family Medicine

## 2016-01-03 ENCOUNTER — Ambulatory Visit (INDEPENDENT_AMBULATORY_CARE_PROVIDER_SITE_OTHER): Payer: 59 | Admitting: Family Medicine

## 2016-01-03 DIAGNOSIS — Z9884 Bariatric surgery status: Secondary | ICD-10-CM | POA: Diagnosis not present

## 2016-01-03 NOTE — Progress Notes (Signed)
   Subjective:    Patient ID: Kelsey Zavala , female   DOB: 05/06/1975 , 40 y.o..   MRN: [id number]  HPI  Kelsey Zavala is here for  Chief Complaint  Patient presents with  . Follow-up   She is POD 9 from gastric sleeve surgery. Patient is here today because she would like to be on short-term disability after her surgery. She states that she stopped work after her surgery on December 20th after she had a gastric sleeve placed. She would prefer to be out of work for 4 weeks after her surgery. Patient notes that she has informed her insurance of this. Patient will need proof that she is under the care of our office. Patient states that she has discussed this already with Dr. Nadine CountsGottschalk.   Since patient's surgery she states that she is doing well. Denies any pain at this time. Is eating and drinking okay. She is following up with her surgeon next week.  Review of Systems: Per HPI. All other systems reviewed and are negative.  Social Hx:  reports that she has never smoked. She has never used smokeless tobacco.   Objective:   BP 120/78   Pulse 89   Temp 98.5 F (36.9 C) (Oral)   Ht 5\' 8"  (1.727 m)   Wt 273 lb 12.8 oz (124.2 kg)   LMP 12/09/2015   SpO2 99%   BMI 41.63 kg/m  Physical Exam  Gen: NAD, alert, well-appearing, obese Cardiac: Regular rate and rhythm Respiratory: non-labored breathing Neurological: no gross deficits.  Psych: good insight   Assessment & Plan:  S/P laparoscopic sleeve gastrectomy Dec 2017 Patient doing well after surgery. Tolerating PO well. Unclear what patient was requesting at the time of visit, followed up with patient with phone conversation with Dr. Randolm IdolFletke to clarify needs. Patient just wanted to have documentation that this is medical home. This is indeed where she sees her PCP. Advised patient to follow up with surgery and her PCP.    Anders Simmondshristina Gambino, MD [facility], PGY-2

## 2016-01-03 NOTE — Assessment & Plan Note (Addendum)
Patient doing well after surgery. Tolerating PO well. Unclear what patient was requesting at the time of visit, followed up with patient with phone conversation with Dr. Randolm IdolFletke to clarify needs. Patient just wanted to have documentation that this is medical home. This is indeed where she sees her PCP. Advised patient to follow up with surgery and her PCP.

## 2016-01-07 ENCOUNTER — Telehealth (HOSPITAL_COMMUNITY): Payer: Self-pay

## 2016-01-07 ENCOUNTER — Ambulatory Visit: Payer: 59 | Admitting: Dietician

## 2016-01-07 NOTE — Telephone Encounter (Signed)
Made discharge phone call to patient asking the following questions.    1. Do you have someone to care for you now that you are home?  Yes  2. Are you having pain now that is not relieved by your pain medication?  "No, My pain is well controlled"  3. Are you able to drink the recommended daily amount of fluids (48 ounces minimum/day) and protein (60-80 grams/day) as prescribed by the dietitian or nutritional counselor?  "My fluid intake is good, I'm only getting in approximately 45g of protein daily"  Patient is working towards the 60g goal"  4. Are you taking the vitamins and minerals as prescribed?  Yes 5. Do you have the "on call" number to contact your surgeon if you have a problem or question?  Yes 6. Are your incisions free of redness, swelling or drainage? (If steri strips, address that these can fall off, shower as tolerated) Patient states that her top incision is slightly open.  Patient has not noticed any redness, drainage, or odor.  Encouraged to monitor for those signs of infection.   7. Have your bowels moved since your surgery?  If not, are you passing gas?  Yes 8. Are you up and walking 3-4 times per day? Yes 9. Were you provided your discharge medications before your surgery or before you were discharged from the hospital and are you taking them without problem?  Yes  Shakayla Hickox RN

## 2016-01-08 ENCOUNTER — Telehealth: Payer: Self-pay | Admitting: Family Medicine

## 2016-01-08 NOTE — Telephone Encounter (Signed)
Spoke to patient re short term disability paperwork.  I have her ROI sheets here.  She notes that [facility] [employer] [facility] will be sending requests for information/ office visits.  She notes some nausea w/ vomiting over last 24 hours.  She has appt scheduled with Dr Daphine DeutscherMartin, her surgeon, tomorrow for evaluation.  Recommended that she have office notes faxed over to us so that I can remain in the loop of her care.  Encouraged to call with questions.  Ashly M. Nadine CountsGottschalk, DO PGY-3, [facility]

## 2016-01-14 ENCOUNTER — Ambulatory Visit: Payer: 59 | Admitting: Dietician

## 2016-04-14 ENCOUNTER — Emergency Department (HOSPITAL_COMMUNITY): Payer: 59

## 2016-04-14 ENCOUNTER — Encounter (HOSPITAL_COMMUNITY): Payer: Self-pay | Admitting: Emergency Medicine

## 2016-04-14 ENCOUNTER — Emergency Department (HOSPITAL_COMMUNITY)
Admission: EM | Admit: 2016-04-14 | Discharge: 2016-04-15 | Disposition: A | Discharge status: Home / Self Care | Payer: 59 | Attending: Emergency Medicine | Admitting: Emergency Medicine

## 2016-04-14 DIAGNOSIS — I10 Essential (primary) hypertension: Secondary | ICD-10-CM | POA: Insufficient documentation

## 2016-04-14 DIAGNOSIS — Z79899 Other long term (current) drug therapy: Secondary | ICD-10-CM | POA: Diagnosis not present

## 2016-04-14 DIAGNOSIS — R110 Nausea: Secondary | ICD-10-CM

## 2016-04-14 LAB — CBC
HCT: 37 % (ref 36.0–46.0)
Hemoglobin: 12.3 g/dL (ref 12.0–15.0)
MCH: 29.6 pg (ref 26.0–34.0)
MCHC: 33.2 g/dL (ref 30.0–36.0)
MCV: 88.9 fL (ref 78.0–100.0)
PLATELETS: 278 10*3/uL (ref 150–400)
RBC: 4.16 MIL/uL (ref 3.87–5.11)
RDW: 13.6 % (ref 11.5–15.5)
WBC: 5.8 10*3/uL (ref 4.0–10.5)

## 2016-04-14 LAB — URINALYSIS, ROUTINE W REFLEX MICROSCOPIC
Bilirubin Urine: NEGATIVE
GLUCOSE, UA: NEGATIVE mg/dL
Hgb urine dipstick: NEGATIVE
Ketones, ur: 5 mg/dL — AB
LEUKOCYTES UA: NEGATIVE
NITRITE: NEGATIVE
PROTEIN: NEGATIVE mg/dL
Specific Gravity, Urine: 1.029 (ref 1.005–1.030)
pH: 5 (ref 5.0–8.0)

## 2016-04-14 LAB — I-STAT BETA HCG BLOOD, ED (MC, WL, AP ONLY)

## 2016-04-14 LAB — BASIC METABOLIC PANEL
Anion gap: 7 (ref 5–15)
BUN: 16 mg/dL (ref 6–20)
CHLORIDE: 106 mmol/L (ref 101–111)
CO2: 23 mmol/L (ref 22–32)
CREATININE: 0.79 mg/dL (ref 0.44–1.00)
Calcium: 9.1 mg/dL (ref 8.9–10.3)
Glucose, Bld: 81 mg/dL (ref 65–99)
Potassium: 3.8 mmol/L (ref 3.5–5.1)
SODIUM: 136 mmol/L (ref 135–145)

## 2016-04-14 NOTE — ED Notes (Signed)
Provided pt with ginger ale. Pt able to keep liquids down.

## 2016-04-14 NOTE — ED Notes (Signed)
Patient tolerating PO fluids 

## 2016-04-14 NOTE — ED Notes (Signed)
ED Provider at bedside. 

## 2016-04-14 NOTE — ED Notes (Signed)
Patient transported to X-ray 

## 2016-04-14 NOTE — ED Notes (Signed)
Patient given ginger ale. 

## 2016-04-14 NOTE — ED Notes (Signed)
Patient states that she wants to be checked for STDs. Denies any symptoms at this time but feels she has issues trusting her partner.

## 2016-04-14 NOTE — ED Triage Notes (Signed)
Patient states that she is bariatric surgery patient and over the past 2 weeks hasnt been able to get in her PO fluids due to not appetizing or nauseated.  Patient states that she has tried different Po fluids and oral yogurt etc. Patient states that she has been weak/fatigued over the past 2 days. Patient states that she has barely any urine output and is darker in color than normal.  Patient denies dysuria. Patient believes she is dehydrated.

## 2016-04-15 MED ORDER — ONDANSETRON 8 MG PO TBDP: 8.0000 mg | ORAL_TABLET | Freq: Three times a day (TID) | ORAL | 0 refills | Status: DC | PRN

## 2016-04-15 MED ORDER — FAMOTIDINE 20 MG PO TABS: 20.0000 mg | ORAL_TABLET | Freq: Two times a day (BID) | ORAL | 0 refills | Status: DC

## 2016-04-15 NOTE — ED Provider Notes (Signed)
[facility]-EMERGENCY DEPT Provider Note   CSN: [id number] Arrival date & time: 04/14/16  1841     History   Chief Complaint Chief Complaint  Patient presents with  . unable to get in PO fluids  . Fatigue    HPI Kelsey Zavala is a 41 y.o. female.  HPI Pt Has a history of gastric sleeve bariatric surgery back in December.   For the last several weeks she's had trouble with nausea and poor appetite.  Certain foods seem to make her more nauseated.  She has been trying to keep up with her fluid intake. She's also tried various foods including yogurt. She feels like she has been getting dehydrated. She's felt more fatigued over the last couple of days. Her urine output has decreased. She has called her bariatric surgeon.  She prefers to only see Dr. Daphine Deutscher in 's her appointment is not for a couple of weeks. Past Medical History:  Diagnosis Date  . GAD (generalized anxiety disorder)    OK NOW  . Genital herpes   . History of abnormal Pap smear   . HTN (hypertension)     Patient Active Problem List   Diagnosis Date Noted  . S/P laparoscopic sleeve gastrectomy Dec 2017 12/25/2015  . Itching 06/20/2015  . Vaginal yeast infection 06/11/2015  . Abnormal Pap smear of cervix 04/04/2015  . Meralgia paraesthetica 11/16/2014  . Dysmenorrhea 08/13/2014  . Grief reaction 08/13/2014  . Flank pain, acute 05/24/2014  . Morbid obesity (HCC) 03/29/2014  . Edema 03/03/2014  . Polymyalgia (HCC) 12/13/2013  . Essential hypertension, benign 09/26/2013  . Wellness examination 09/26/2013  . Acne 02/23/2011  . Other and unspecified ovarian cyst 09/25/2008  . PROTEINURIA, MILD 09/25/2008  . Genital herpes 08/16/2006  . Obesity 03/04/2006  . TOBACCO USE, QUIT 03/04/2006    Past Surgical History:  Procedure Laterality Date  . COLPOSCOPY    . LAPAROSCOPIC GASTRIC SLEEVE RESECTION N/A 12/25/2015   Procedure: LAPAROSCOPIC GASTRIC SLEEVE RESECTION, UPPER ENDO;  Surgeon: Luretha Murphy, MD;   Location: [facility] ORS;  Service: General;  Laterality: N/A;  . TUBAL LIGATION      OB History    No data available       Home Medications    Prior to Admission medications   Medication Sig Start Date End Date Taking? Authorizing Provider  acetaminophen (TYLENOL) 325 MG tablet Take 650 mg by mouth every 6 (six) hours as needed for mild pain, moderate pain, fever or headache.    Yes Historical Provider, MD  Calcium-Vitamin A-Vitamin D (LIQUID CALCIUM PO) Take 30 mLs by mouth daily.   Yes Historical Provider, MD  Multiple Vitamins-Minerals (MULTIVITAMIN ADULTS) TABS Take 1 tablet by mouth daily.   Yes Historical Provider, MD  oxyCODONE (ROXICODONE) 5 MG/5ML solution Take 5-10 mLs (5-10 mg total) by mouth every 6 (six) hours as needed for moderate pain or severe pain. 12/28/15  Yes Ovidio Kin, MD  valACYclovir (VALTREX) 500 MG tablet Take 1 tablet once daily for suppression. Can take 1 tablet by mouth two times a day x 3 days for outbreaks Patient taking differently: Take 500 mg by mouth daily. Pt takes one tablet twice daily for three days if she has an outbreak. 12/11/15  Yes Ashly M Gottschalk, DO  famotidine (PEPCID) 20 MG tablet Take 1 tablet (20 mg total) by mouth 2 (two) times daily. 04/15/16   Linwood Dibbles, MD  hydrochlorothiazide (HYDRODIURIL) 25 MG tablet Take 1 tablet (25 mg total) by mouth daily. Patient  not taking: Reported on 04/14/2016 12/11/15   Ashly M Gottschalk, DO  lisinopril (PRINIVIL,ZESTRIL) 10 MG tablet Take 1 tablet (10 mg total) by mouth daily. Patient not taking: Reported on 04/14/2016 12/11/15   Ashly M Gottschalk, DO  ondansetron (ZOFRAN ODT) 8 MG disintegrating tablet Take 1 tablet (8 mg total) by mouth every 8 (eight) hours as needed for nausea or vomiting. 04/15/16   Linwood Dibbles, MD  pantoprazole (PROTONIX) 20 MG tablet Take 1 tablet (20 mg total) by mouth daily. Patient not taking: Reported on 04/14/2016 12/28/15   Ovidio Kin, MD    Family History Family History    Problem Relation Age of Onset  . Heart attack Father   . Hypertension Father   . Diabetes Father   . Hypertension Mother   . Aneurysm Mother   . Autism Daughter   . Heart attack Cousin 39  . Diabetes Paternal Aunt   . Kidney failure Sister     Social History Social History  Substance Use Topics  . Smoking status: Never Smoker  . Smokeless tobacco: Never Used  . Alcohol use No     Allergies   Other and Norvasc [amlodipine besylate]   Review of Systems Review of Systems  All other systems reviewed and are negative.    Physical Exam Updated Vital Signs BP 136/84 (BP Location: Left Arm)   Pulse 87   Temp 99.3 F (37.4 C) (Oral)   Resp 20   Ht  (1.727 m)   Wt 113.5 kg   LMP 03/25/2016 Comment: neg preg test today  SpO2 99%   BMI 38.05 kg/m   Physical Exam  Constitutional: She appears well-developed and well-nourished. No distress.  HENT:  Head: Normocephalic and atraumatic.  Right Ear: External ear normal.  Left Ear: External ear normal.  Eyes: Conjunctivae are normal. Right eye exhibits no discharge. Left eye exhibits no discharge. No scleral icterus.  Neck: Neck supple. No tracheal deviation present.  Cardiovascular: Normal rate, regular rhythm and intact distal pulses.   Pulmonary/Chest: Effort normal and breath sounds normal. No stridor. No respiratory distress. She has no wheezes. She has no rales.  Abdominal: Soft. Bowel sounds are normal. She exhibits no distension. There is no tenderness. There is no rebound and no guarding.  Musculoskeletal: She exhibits no edema or tenderness.  Neurological: She is alert. She has normal strength. No cranial nerve deficit (no facial droop, extraocular movements intact, no slurred speech) or sensory deficit. She exhibits normal muscle tone. She displays no seizure activity. Coordination normal.  Skin: Skin is warm and dry. No rash noted.  Psychiatric: She has a normal mood and affect.  Nursing note and vitals  reviewed.    ED Treatments / Results  Labs (all labs ordered are listed, but only abnormal results are displayed) Labs Reviewed  URINALYSIS, ROUTINE W REFLEX MICROSCOPIC - Abnormal; Notable for the following:       Result Value   APPearance CLOUDY (*)    Ketones, ur 5 (*)    All other components within normal limits  BASIC METABOLIC PANEL  CBC  RPR  HIV ANTIBODY (ROUTINE TESTING)  I-STAT BETA HCG BLOOD, ED ([facility], [facility], [facility] ONLY)     Radiology Dg Abdomen 1 View  Result Date: 04/14/2016 CLINICAL DATA:  Nausea and vomiting for 1 day, dehydrated. Status post gastric sleeve December 2017. EXAM: ABDOMEN - 1 VIEW COMPARISON:  None. FINDINGS: The bowel gas pattern is normal. No radio-opaque calculi or other significant radiographic abnormality are  seen. Surgical staples LEFT upper quadrant consistent with surgical history. IMPRESSION: Normal bowel gas pattern. Electronically Signed   By: Awilda Metro M.D.   On: 04/14/2016 22:25    Procedures Procedures (including critical care time)  Medications Ordered in ED Medications - No data to display   Initial Impression / Assessment and Plan / ED Course  I have reviewed the triage vital signs and the nursing notes.  Pertinent labs & imaging results that were available during my care of the patient were reviewed by me and considered in my medical decision making (see chart for details).  Clinical Course as of Apr 16 10  Tue Apr 14, 2016  2145 Pt requests RPR and HIV.   She has concerns about her boyfriend  [JK]    Clinical Course User Index [JK] Linwood Dibbles, MD    Patient's laboratory tests are reassuring. X-rays do not show any evidence of acute abnormalities. The patient is feeling better in the emergency room. We discussed IV fluids but she feels like she is able to keep down oral fluids right now.  No evidence of any dehydration. No evidence of any electrolyte abnormalities. I discussed trying Pepcid and Zofran as needed at home.  Follow up with her surgeon as planned.  At this time there does not appear to be any evidence of an acute emergency medical condition and the patient appears stable for discharge with appropriate outpatient follow up.   Final Clinical Impressions(s) / ED Diagnoses   Final diagnoses:  Nausea    New Prescriptions New Prescriptions   FAMOTIDINE (PEPCID) 20 MG TABLET    Take 1 tablet (20 mg total) by mouth 2 (two) times daily.   ONDANSETRON (ZOFRAN ODT) 8 MG DISINTEGRATING TABLET    Take 1 tablet (8 mg total) by mouth every 8 (eight) hours as needed for nausea or vomiting.     Linwood Dibbles, MD 04/15/16 [phone]

## 2016-04-15 NOTE — Discharge Instructions (Signed)
Take the medications as prescribed, follow up with Dr Daphine Deutscher as planned.  Continue your fluid intake

## 2016-04-16 LAB — RPR: RPR Ser Ql: NONREACTIVE

## 2016-04-16 LAB — HIV ANTIBODY (ROUTINE TESTING W REFLEX): HIV SCREEN 4TH GENERATION: NONREACTIVE

## 2016-04-27 ENCOUNTER — Ambulatory Visit (INDEPENDENT_AMBULATORY_CARE_PROVIDER_SITE_OTHER): Payer: 59 | Admitting: Family Medicine

## 2016-04-27 ENCOUNTER — Encounter: Payer: Self-pay | Admitting: Student

## 2016-04-27 ENCOUNTER — Other Ambulatory Visit (HOSPITAL_COMMUNITY)
Admission: RE | Admit: 2016-04-27 | Discharge: 2016-04-27 | Disposition: A | Discharge status: Home / Self Care | Payer: 59 | Source: Ambulatory Visit | Attending: Family Medicine | Admitting: Family Medicine

## 2016-04-27 VITALS — BP 118/66 | HR 77 | Temp 99.3°F | Wt 246.0 lb

## 2016-04-27 DIAGNOSIS — N898 Other specified noninflammatory disorders of vagina: Secondary | ICD-10-CM

## 2016-04-27 DIAGNOSIS — L0292 Furuncle, unspecified: Secondary | ICD-10-CM

## 2016-04-27 LAB — POCT WET PREP (WET MOUNT)
Clue Cells Wet Prep Whiff POC: NEGATIVE
TRICHOMONAS WET PREP HPF POC: ABSENT

## 2016-04-27 MED ORDER — SULFAMETHOXAZOLE-TRIMETHOPRIM 800-160 MG PO TABS: 1.0000 | ORAL_TABLET | Freq: Two times a day (BID) | ORAL | 0 refills | Status: DC

## 2016-04-27 MED ORDER — FLUCONAZOLE 150 MG PO TABS: 150.0000 mg | ORAL_TABLET | Freq: Every day | ORAL | 0 refills | Status: DC

## 2016-04-27 NOTE — Progress Notes (Signed)
   Subjective:    Patient ID: Kelsey Zavala, female    DOB: 08-28-1975, 41 y.o.   MRN: [id number]   CC:  HPI:   Smoking status reviewed  Review of Systems  Per HPI, else denies recent illness, fever, headache, changes in vision, chest pain, shortness of breath, abdominal pain, N/V/D, weakness    Objective:  BP 118/66   Pulse 77   Temp 99.3 F (37.4 C) (Oral)   Wt 246 lb (111.6 kg)   LMP 04/19/2016   SpO2 98%   BMI 37.40 kg/m  Vitals and nursing note reviewed  General: NAD Cardiac: RRR, normal heart sounds, no murmurs. 2+ radial and PT pulses bilaterally Respiratory: CTAB, normal effort Abdomen: soft, nontender, nondistended, no hepatic or splenomegaly. Bowel sounds present Extremities: no edema or cyanosis. WWP. Skin: warm and dry, no rashes noted Neuro: alert and oriented, no focal deficits   Assessment & Plan:    No problem-specific Assessment & Plan notes found for this encounter.    Alyssa A. Kennon Rounds MD, MS Family Medicine Resident PGY-3 Pager [phone]

## 2016-04-27 NOTE — Patient Instructions (Signed)
We obtained several labs today. The results should return in a few days. Please take Bactrim twice daily as prescribed for 7 days. I have given you a 10 day course of Diflucan for yeast. Please return if no improvement in 2-3 weeks.  Dr. Caroleen Hamman  Hidradenitis Suppurativa Hidradenitis suppurativa is a long-term (chronic) skin disease that starts with blocked sweat glands or hair follicles. Bacteria may grow in these blocked openings of your skin. Hidradenitis suppurativa is like a severe form of acne that develops in areas of your body where acne would be unusual. It is most likely to affect the areas of your body where skin rubs against skin and becomes moist. This includes your:  Underarms.  Groin.  Genital areas.  Buttocks.  Upper thighs.  Breasts. Hidradenitis suppurativa may start out with small pimples. The pimples can develop into deep sores that break open (rupture) and drain pus. Over time your skin may thicken and become scarred. Hidradenitis suppurativa cannot be passed from person to person. What are the causes? The exact cause of hidradenitis suppurativa is not known. This condition may be due to:  Female and female hormones. The condition is rare before and after puberty.  An overactive body defense system (immune system). Your immune system may overreact to the blocked hair follicles or sweat glands and cause swelling and pus-filled sores. What increases the risk? You may have a higher risk of hidradenitis suppurativa if you:  Are a woman.  Are between ages 105 and 40.  Have a family history of hidradenitis suppurativa.  Have a personal history of acne.  Are overweight.  Smoke.  Take the drug lithium. What are the signs or symptoms? The first signs of an outbreak are usually painful skin bumps that look like pimples. As the condition progresses:  Skin bumps may get bigger and grow deeper into the skin.  Bumps under the skin may rupture and drain smelly  pus.  Skin may become itchy and infected.  Skin may thicken and scar.  Drainage may continue through tunnels under the skin (fistulas).  Walking and moving your arms can become painful. How is this diagnosed? Your health care provider may diagnose hidradenitis suppurativa based on your medical history and your signs and symptoms. A physical exam will also be done. You may need to see a health care provider who specializes in skin diseases (dermatologist). You may also have tests done to confirm the diagnosis. These can include:  Swabbing a sample of pus or drainage from your skin so it can be sent to the lab and tested for infection.  Blood tests to check for infection. How is this treated? The same treatment will not work for everybody with hidradenitis suppurativa. Your treatment will depend on how severe your symptoms are. You may need to try several treatments to find what works best for you. Part of your treatment may include cleaning and bandaging (dressing) your wounds. You may also have to take medicines, such as the following:  Antibiotics.  Acne medicines.  Medicines to block or suppress the immune system.  A diabetes medicine (metformin) is sometimes used to treat this condition.  For women, birth control pills can sometimes help relieve symptoms. You may need surgery if you have a severe case of hidradenitis suppurativa that does not respond to medicine. Surgery may involve:  Using a laser to clear the skin and remove hair follicles.  Opening and draining deep sores.  Removing the areas of skin that are diseased and  scarred. Follow these instructions at home:  Learn as much as you can about your disease, and work closely with your health care providers.  Take medicines only as directed by your health care provider.  If you were prescribed an antibiotic medicine, finish it all even if you start to feel better.  If you are overweight, losing weight may be very  helpful. Try to reach and maintain a healthy weight.  Do not use any tobacco products, including cigarettes, chewing tobacco, or electronic cigarettes. If you need help quitting, ask your health care provider.  Do not shave the areas where you get hidradenitis suppurativa.  Do not wear deodorant.  Wear loose-fitting clothes.  Try not to overheat and get sweaty.  Take a daily bleach bath as directed by your health care provider.  Fill your bathtub halfway with water.  Pour in  cup of unscented household bleach.  Soak for 5-10 minutes.  Cover sore areas with a warm, clean washcloth (compress) for 5-10 minutes. Contact a health care provider if:  You have a flare-up of hidradenitis suppurativa.  You have chills or a fever.  You are having trouble controlling your symptoms at home. This information is not intended to replace advice given to you by your health care provider. Make sure you discuss any questions you have with your health care provider. Document Released: 08/06/2003 Document Revised: 05/30/2015 Document Reviewed: 03/24/2013 Elsevier Interactive Patient Education  2017 [employer].

## 2016-04-28 LAB — CERVICOVAGINAL ANCILLARY ONLY
CHLAMYDIA, DNA PROBE: NEGATIVE
NEISSERIA GONORRHEA: NEGATIVE

## 2016-04-28 NOTE — Progress Notes (Signed)
Subjective:     Patient ID: Kelsey Zavala, female   DOB: 08-06-1975, 41 y.o.   MRN: [id number]  HPI Kelsey Zavala is a 41yo female presenting today for boils in groin.  Note, Kelsey Zavala was initially on Dr. Elie Confer schedule. Dr. Kennon Rounds noted a bug in Kelsey Zavala's hair and attempted to remove it but the bug jumped. Kelsey Zavala became upset and shook her hair in Dr. Elie Confer direction. Dr. Kennon Rounds took a step back and Kelsey Zavala then became even more angry and insisted on being evaluated by another physician.  Reports four day history of boils in groin. Boils seem to come and go intermittently and occasionally also occur in axilla. Notes vaginal itching with milky discharge, no odor. Reports she was diagnosed with  Herpes in the past, but her last several flares do not look like the pictures she has googled of herpes. Denies dysuria, frequency, or urgency. Denies abdominal pain or flank pain. Sexually active with husband, does not use protection. Notes if antibiotics are needed today, she will need course of diflucan--often requires 7-14day course and 1-2 dose does not work for her.  Note Kelsey Zavala remained very easily angered throughout my encounter. Stated she was going to file a complaint about her care her. Plans to move from [facility] soon and no longer plans on receiving her care here.  Nonsmoker.  Review of Systems Per HPI    Objective:   Physical Exam  Constitutional: She appears well-developed and well-nourished. No distress.  Cardiovascular: Normal rate and regular rhythm.   No murmur heard. Pulmonary/Chest: Effort normal. No respiratory distress. She has no wheezes.  Abdominal: Soft. She exhibits no distension. There is no tenderness.  No CVA tenderness  Genitourinary:  Genitourinary Comments: White vaginal discharge noted, adhering to vaginal walls. No adnexal tenderness noted. No cervical motion tenderness noted. No vaginal wall tenderness.   Skin:  Two boils noted in right groin, no  fluctuance noted  Psychiatric: She has a normal mood and affect. Her behavior is normal.      Assessment and Plan:     Boils, Vaginal Discharge Suspect possible Hidradenitis Suppurativa given reports of similar lesions in axilla and intermittent flares. No fluctuance noted, so I do not think I&D would be beneficial. Will swab for HSV given prior diagnosis. GC/Chlamydia screen obtained. Wet Prep with yeast. Prescription for Bactrim given in conjunction with Diflucan. Follow up if no improvement.

## 2016-04-29 ENCOUNTER — Other Ambulatory Visit (HOSPITAL_COMMUNITY): Payer: Self-pay | Admitting: Surgery

## 2016-04-29 DIAGNOSIS — Z9884 Bariatric surgery status: Secondary | ICD-10-CM

## 2016-04-30 LAB — HERPES SIMPLEX VIRUS CULTURE

## 2016-05-06 ENCOUNTER — Other Ambulatory Visit (HOSPITAL_COMMUNITY): Payer: Self-pay | Admitting: Surgery

## 2016-05-06 DIAGNOSIS — Z9884 Bariatric surgery status: Secondary | ICD-10-CM

## 2016-07-21 ENCOUNTER — Other Ambulatory Visit (HOSPITAL_COMMUNITY)
Admission: RE | Admit: 2016-07-21 | Discharge: 2016-07-21 | Disposition: A | Discharge status: Home / Self Care | Payer: Self-pay | Source: Ambulatory Visit | Attending: Family Medicine | Admitting: Family Medicine

## 2016-07-21 ENCOUNTER — Encounter: Payer: Self-pay | Admitting: Student in an Organized Health Care Education/Training Program

## 2016-07-21 ENCOUNTER — Ambulatory Visit (INDEPENDENT_AMBULATORY_CARE_PROVIDER_SITE_OTHER): Payer: Self-pay | Admitting: Student in an Organized Health Care Education/Training Program

## 2016-07-21 VITALS — BP 138/98 | HR 91 | Temp 98.3°F | Ht 68.0 in | Wt 227.4 lb

## 2016-07-21 DIAGNOSIS — Z87891 Personal history of nicotine dependence: Secondary | ICD-10-CM

## 2016-07-21 DIAGNOSIS — N898 Other specified noninflammatory disorders of vagina: Secondary | ICD-10-CM | POA: Insufficient documentation

## 2016-07-21 DIAGNOSIS — A6000 Herpesviral infection of urogenital system, unspecified: Secondary | ICD-10-CM

## 2016-07-21 DIAGNOSIS — R87619 Unspecified abnormal cytological findings in specimens from cervix uteri: Secondary | ICD-10-CM

## 2016-07-21 LAB — POCT WET PREP (WET MOUNT)
CLUE CELLS WET PREP WHIFF POC: NEGATIVE
TRICHOMONAS WET PREP HPF POC: ABSENT

## 2016-07-21 MED ORDER — VARENICLINE TARTRATE 0.5 MG X 11 & 1 MG X 42 PO MISC: ORAL | 0 refills | Status: DC

## 2016-07-21 MED ORDER — METRONIDAZOLE 500 MG PO TABS: 500.0000 mg | ORAL_TABLET | Freq: Two times a day (BID) | ORAL | 0 refills | Status: AC

## 2016-07-21 NOTE — Progress Notes (Signed)
CC: vaginal odor/discomfort  HPI: Kelsey Zavala is a 41 y.o. female with PMH significant for HTN, obesity, tobacco use disorderwho presents to [facility] with vaginal odor and discomfort, as well as interest in smoking cessation.  Vaginal odor/discomfort - fishy odor and "discomfort" which is not pain or itching - no dyspareunia - no pruritis - + milky discharge - no pain with urination, no urinary frequency, no hematuria or urgency - states symptoms began after using different soap than usual on a business trip and also used a "scented tampon"  Tobacco cessation - patient endorses having quit smoking previously using chantax - her mother recently died and she started smoking again - she smokes less than one half pack per day - she would like to quit, she is using the smoking quit line and would like Rx for chantax again  Questions about previous GU exams - patient reports she was previously diagnosed with HSV, also had biopsy of her cervix done - she reports she is not confident that she actually has HSV/would like a second opinion on this diagnosis - she reports she is unsure what the results of her cervical biopsy were  Review of Symptoms:  See HPI for ROS.   CC, SH/smoking status, and VS noted.  Objective: BP (!) 138/98   Pulse 91   Temp 98.3 F (36.8 C) (Oral)   Ht 5\' 8"  (1.727 m)   Wt 227 lb 6.4 oz (103.1 kg)   LMP 07/11/2016 (Exact Date)   SpO2 99%   BMI 34.58 kg/m  GEN: NAD, alert, cooperative, and pleasant. GU: Female genitalia: normal external genitalia, vulva, vagina, cervix, uterus and adnexa. +lesion noted on cervix which she reports was previously biopsied SKIN: warm and dry, no rashes or lesions NEURO: II-XII grossly intact, normal gait, peripheral sensation intact PSYCH: AAOx3, appropriate affect  Wet prep: No WBC, few bacteria, no clue cells, negative whiff, no yeast, absent trich  Assessment and plan:  TOBACCO USE, QUIT - verenicline starter pack  prescribed - declines pharmacy appointment - follow up with PCP advised - continue quit line use  Genital herpes - patient has this in her history from previous provider, however most recent swab was negative - did not have time to give this full attention this visit - advise follow up with PCP   Abnormal Pap smear of cervix - follow up with PCP   Vaginal odor - wet prep, GC/Chlamydia drawn today - patient declines UA, states that an additional test will be very expensive for her - wet prep was completely WNL. Gave patient these results, she feels strongly she is developing a BV infection because her symptoms are consistent with prior infections - will give antibiotics this time given her insistence - will follow up with results of GC/Chla   Orders Placed This Encounter  Procedures  . POCT Wet Prep [facility])    Meds ordered this encounter  Medications  . varenicline (CHANTIX STARTING MONTH PAK) 0.5 MG X 11 & 1 MG X 42 tablet    Sig: Take one 0.5 mg tablet by mouth once daily for 3 days, then increase to one 0.5 mg tablet twice daily for 4 days, then increase to one 1 mg tablet twice daily.    Dispense:  53 tablet    Refill:  0  . metroNIDAZOLE (FLAGYL) 500 MG tablet    Sig: Take 1 tablet (500 mg total) by mouth 2 (two) times daily.    Dispense:  14  tablet    Refill:  0    Howard PouchLauren Nyala Kirchner, MD,MS,  PGY2 07/22/2016 10:35 AM

## 2016-07-21 NOTE — Patient Instructions (Addendum)
It was a pleasure seeing you today in our clinic.  - I sent the chantix starter month to your pharmacy - please continue to use the quit line as needed for quitting smoking - please feel free to schedule a follow up appointment in our office for continued counseling for quitting smoking  - You do not appear to have a vaginal infection, however given your symptoms and you know your own body best, I will send the treatment for bacterial vaginosis to your pharmacy  Please schedule a visit with your PCP for further medical concerns  Our clinic's number is [phone]. Please call with questions or concerns about what we discussed today.  Be well, Dr. Mosetta PuttFeng

## 2016-07-22 ENCOUNTER — Telehealth: Payer: Self-pay | Admitting: Student in an Organized Health Care Education/Training Program

## 2016-07-22 DIAGNOSIS — N898 Other specified noninflammatory disorders of vagina: Secondary | ICD-10-CM | POA: Insufficient documentation

## 2016-07-22 LAB — CERVICOVAGINAL ANCILLARY ONLY
Chlamydia: NEGATIVE
Neisseria Gonorrhea: NEGATIVE

## 2016-07-22 NOTE — Assessment & Plan Note (Signed)
-  follow up with PCP

## 2016-07-22 NOTE — Telephone Encounter (Signed)
I called and spoke with patient, informed her that GC/Chla screen was negative in clinic yesterday

## 2016-07-22 NOTE — Assessment & Plan Note (Addendum)
-   wet prep, GC/Chlamydia drawn today - patient declines UA, states that an additional test will be very expensive for her - wet prep was completely WNL. Gave patient these results, she feels strongly she is developing a BV infection because her symptoms are consistent with prior infections - will give antibiotics this time given her insistence - will follow up with results of GC/Chla

## 2016-07-22 NOTE — Assessment & Plan Note (Signed)
-   verenicline starter pack prescribed - declines pharmacy appointment - follow up with PCP advised - continue quit line use

## 2016-07-22 NOTE — Assessment & Plan Note (Signed)
-   patient has this in her history from previous provider, however most recent swab was negative - did not have time to give this full attention this visit - advise follow up with PCP

## 2016-08-25 ENCOUNTER — Ambulatory Visit (INDEPENDENT_AMBULATORY_CARE_PROVIDER_SITE_OTHER): Payer: Self-pay | Admitting: Internal Medicine

## 2016-08-25 VITALS — BP 122/84 | HR 98 | Temp 99.1°F | Ht 68.0 in | Wt 219.8 lb

## 2016-08-25 DIAGNOSIS — R21 Rash and other nonspecific skin eruption: Secondary | ICD-10-CM

## 2016-08-25 MED ORDER — CETIRIZINE HCL 5 MG/5ML PO SOLN: 10.0000 mg | Freq: Every day | ORAL | 1 refills | Status: DC

## 2016-08-25 NOTE — Assessment & Plan Note (Addendum)
-   Suspect allergic reaction with rash on lips and R leg, though no trigger identified. No red flag symptoms of dyspnea or trouble eating. Swelling of lips improving, per patient. No vesicles on lips to suggest herpes.  - Recommended zyrtec for itching and swelling. - Try hydrocortisone 1% OTC ointment for rash on leg.  - Would consider allergy testing in future if has more episodes

## 2016-08-25 NOTE — Patient Instructions (Signed)
Kelsey Zavala,  It does look like you have an allergic reaction. I do not see any lesions in your mouth to suggest hand foot and mouth disease. You do not have any vesicles to suggest herpes.  Try zyrtec for the next few days. If no improvement, call in and we could consider steroids.  Best, Dr. Sampson Goon

## 2016-08-25 NOTE — Progress Notes (Signed)
[facility] Progress Note  Subjective:  Kelsey Zavala is a 41 y.o. female with history of gastric sleeve surgery and 1-day history of rash. Symptoms began yesterday. She noticed bumps on her upper lip with sensation of numbness. This morning, she felt that her upper lip was swollen. She also noted rash on her R lower leg that itches some. She has not had changes in diet. Does a lot of juicing but denies such a reaction in the past. Says she tends to buy organic and washes produce well. No new soaps, toothpastes. Also has sensation of "fluid" in her R ear. Has not tried anything for symptoms--says she did not want to take benadryl as she needed to work. She is not taking any regular medications beside vitamin supplements--lisinopril is on medication list but has not taken for months (asks that I do not update her list, as she would prefer this be done by her PCP). No sick contacts.  ROS: No SOB, no trouble swallow  Allergies  Allergen Reactions  . Other Shortness Of Breath and Other (See Comments)    Pt is allergic to marshmallow root.    . Norvasc [Amlodipine Besylate] Swelling and Other (See Comments)    Reaction:  Hand/feet/leg swelling     Objective: Blood pressure 122/84, pulse 98, temperature 99.1 F (37.3 C), temperature source Oral, height 5\' 8"  (1.727 m), weight 219 lb 12.8 oz (99.7 kg), SpO2 98 %. Body mass index is 33.42 kg/m. Constitutional: Pleasant female, in NAD HENT: MMM, no swelling or tongue or lesions of mucosa. Few small papules but no vesicles just below the vermillion of outer edges of upper lip. No obvious swelling noted. TMs normal bilaterally.  Cardiovascular: RRR, S1, S2, no m/r/g.  Pulmonary/Chest: Effort normal and breath sounds normal. No respiratory distress.  Neurological: AOx3, no focal deficits. Skin: Few slightly erythematous papules of R medial lower leg.  Psychiatric: Somewhat anxious affect.  Vitals reviewed  Assessment/Plan: Rash and  nonspecific skin eruption - Suspect allergic reaction with rash on lips and R leg, though no trigger identified. No red flag symptoms of dyspnea or trouble eating. Swelling of lips improving, per patient. No vesicles on lips to suggest herpes.  - Recommended zyrtec for itching and swelling. - Try hydrocortisone 1% OTC ointment for rash on leg.  - Would consider allergy testing in future if has more episodes  Follow-up prn.  Dani Gobble, MD [facility], PGY-3

## 2017-02-05 ENCOUNTER — Telehealth: Payer: Self-pay | Admitting: *Deleted

## 2017-02-05 NOTE — Telephone Encounter (Signed)
-----   Message from Darreld McleanJessica M Smith sent at 02/04/2017  5:55 PM EST ----- Regarding: Questions-we have not seen the pt, she would be new- Contact: [phone] My name is Nance PewDedra Wahlstrom and I was told to speak with the nurse because I'm wanting a procedure for a corn removal from the top of my toe. If you would give me a call back at [phone]. I wanted to know what I would have to do and what not and after speaking with you I would like to speak to someone in insurance to see how it is billed and things like that.   Message was left at 11:26 am -she is not a pt of ours at this time.

## 2017-02-05 NOTE — Telephone Encounter (Signed)
Unable to leave message the mail box was full.

## 2017-02-25 ENCOUNTER — Ambulatory Visit: Payer: BLUE CROSS/BLUE SHIELD | Admitting: Podiatry

## 2017-02-25 ENCOUNTER — Ambulatory Visit (INDEPENDENT_AMBULATORY_CARE_PROVIDER_SITE_OTHER): Payer: BLUE CROSS/BLUE SHIELD

## 2017-02-25 ENCOUNTER — Encounter: Payer: Self-pay | Admitting: Podiatry

## 2017-02-25 VITALS — BP 138/84 | HR 67 | Resp 16

## 2017-02-25 DIAGNOSIS — M2041 Other hammer toe(s) (acquired), right foot: Secondary | ICD-10-CM

## 2017-02-25 DIAGNOSIS — M2042 Other hammer toe(s) (acquired), left foot: Secondary | ICD-10-CM | POA: Diagnosis not present

## 2017-02-25 NOTE — Progress Notes (Signed)
Subjective:  Patient ID: Kelsey Zavala, female    DOB: 06/24/1975,  MRN: [id number] HPI Chief Complaint  Patient presents with  . Toe Pain    3rd and 4th toes bilateral - developing corns on toes, wants removed, wears dress shoes for work which are now becoming uncomfortable, also callused area plantar forefoot left     42 y.o. female presents with the above complaint.     Past Medical History:  Diagnosis Date  . GAD (generalized anxiety disorder)    OK NOW  . Genital herpes   . History of abnormal Pap smear   . HTN (hypertension)    Past Surgical History:  Procedure Laterality Date  . COLPOSCOPY    . LAPAROSCOPIC GASTRIC SLEEVE RESECTION N/A 12/25/2015   Procedure: LAPAROSCOPIC GASTRIC SLEEVE RESECTION, UPPER ENDO;  Surgeon: Luretha MurphyMatthew Martin, MD;  Location: [facility];  Service: General;  Laterality: N/A;  . TUBAL LIGATION      Current Outpatient Medications:  .  acetaminophen (TYLENOL) 325 MG tablet, Take 650 mg by mouth every 6 (six) hours as needed for mild pain, moderate pain, fever or headache. , Disp: , Rfl:  .  Calcium-Vitamin A-Vitamin D (LIQUID CALCIUM PO), Take 30 mLs by mouth daily., Disp: , Rfl:  .  Multiple Vitamins-Minerals (MULTIVITAMIN ADULTS) TABS, Take 1 tablet by mouth daily., Disp: , Rfl:   Allergies  Allergen Reactions  . Other Shortness Of Breath and Other (See Comments)    Pt is allergic to marshmallow root.    Marland Kitchen. Norvasc [Amlodipine Besylate] Swelling and Other (See Comments)    Reaction:  Hand/feet/leg swelling    Review of Systems  All other systems reviewed and are negative.  Objective:   Vitals:   02/25/17 1335  BP: 138/84  Pulse: 67  Resp: 16    General: Well developed, nourished, in no acute distress, alert and oriented x3   Dermatological: Skin is warm, dry and supple bilateral. Nails x 10 are well maintained; remaining integument appears unremarkable at this time. There are no open sores, no preulcerative lesions, no rash or signs of  infection present.  Vascular: Dorsalis Pedis artery and Posterior Tibial artery pedal pulses are 2/4 bilateral with immedate capillary fill time. Pedal hair growth present. No varicosities and no lower extremity edema present bilateral.   Neruologic: Grossly intact via light touch bilateral. Vibratory intact via tuning fork bilateral. Protective threshold with Semmes Wienstein monofilament intact to all pedal sites bilateral. Patellar and Achilles deep tendon reflexes 2+ bilateral. No Babinski or clonus noted bilateral.   Musculoskeletal: No gross boney pedal deformities bilateral. No pain, crepitus, or limitation noted with foot and ankle range of motion bilateral. Muscular strength 5/5 in all groups tested bilateral.  Flexible hammertoe deformities.  These toes will lay down flat and they are not rigid.  She does have some postinflammatory hyperpigmentation associated with irritation of the toes because of shoe gear.  But she does wear high heels on a regular basis.  Gait: Unassisted, Nonantalgic.    Radiographs:  Radiographs taken today 3 views bilateral foot demonstrates relatively rectus foot without acute findings.  Osseously mature individual  Assessment & Plan:   Assessment: Flexible hammertoe deformities resulting in reactive hyper keratotic lesions and postinflammatory hyperpigmentation.  Plan: Discussed etiology pathology conservative versus surgical therapies I expressed to her that really the only thing that could be done to even help with the discoloration and the reactive hyperkeratosis is to possibly perform tenotomies flexors and extensors to help keep  the toes from rubbing in the shoes.  She asked if we could cut the discoloration out I expressed to her that as long as the toes bend the way they are and rubbing her shoes the discoloration will be there and that there is nothing that can be done about that.  She understands that is amenable to it we will follow-up with me on an  as-needed basis.     Sheppard Luckenbach T. [place], [place]

## 2017-04-16 ENCOUNTER — Ambulatory Visit: Payer: Self-pay | Admitting: Family Medicine

## 2017-06-25 IMAGING — US US TRANSVAGINAL NON-OB
1 series · 15 of 25 positions shown · non-contrast
Comparison: 07/29/2009.

CLINICAL DATA: Pelvic pain.  Dysmenorrhea.

EXAM:
TRANSABDOMINAL AND TRANSVAGINAL ULTRASOUND OF PELVIS
TECHNIQUE: Both transabdominal and transvaginal ultrasound examinations of the
pelvis were performed. Transabdominal technique was performed for
global imaging of the pelvis including uterus, ovaries, adnexal
regions, and pelvic cul-de-sac. It was necessary to proceed with
endovaginal exam following the transabdominal exam to visualize the
uterus and ovaries.

[Series 1: us transvaginal non-ob · 15 of 44 slices shown]
[im 1/44]
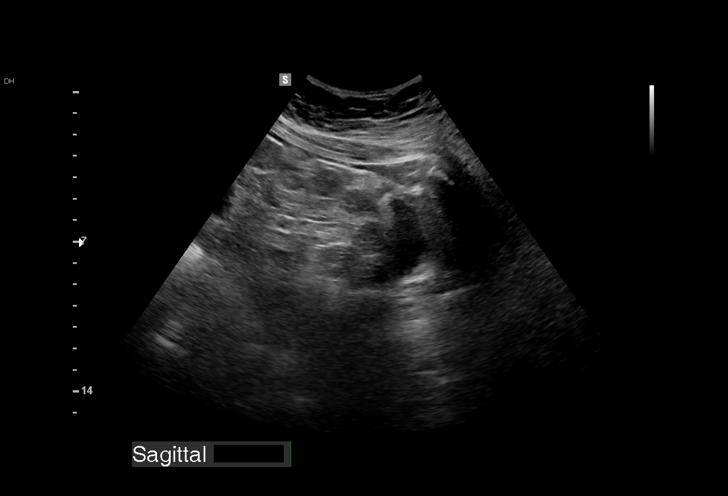
[im 4/44]
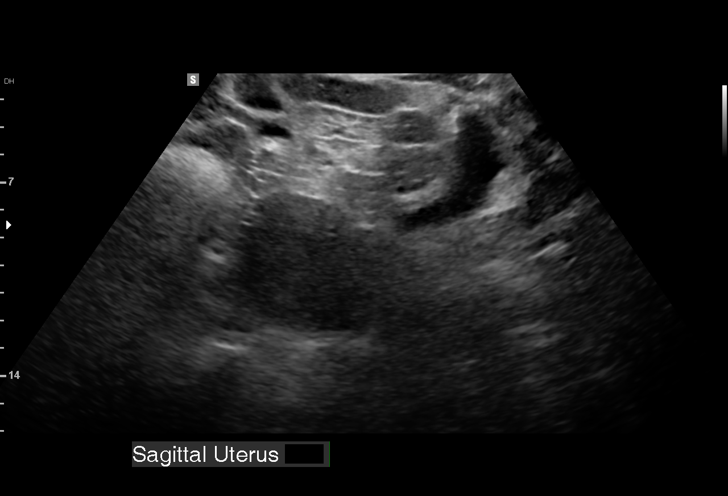
[im 8/44]
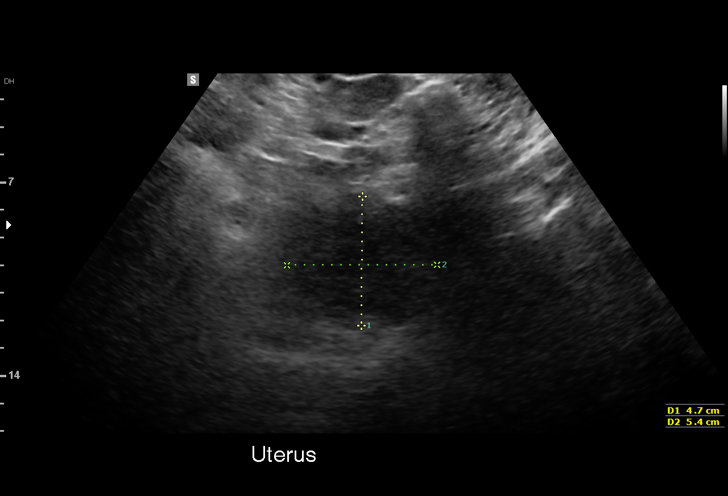
[im 9/44]
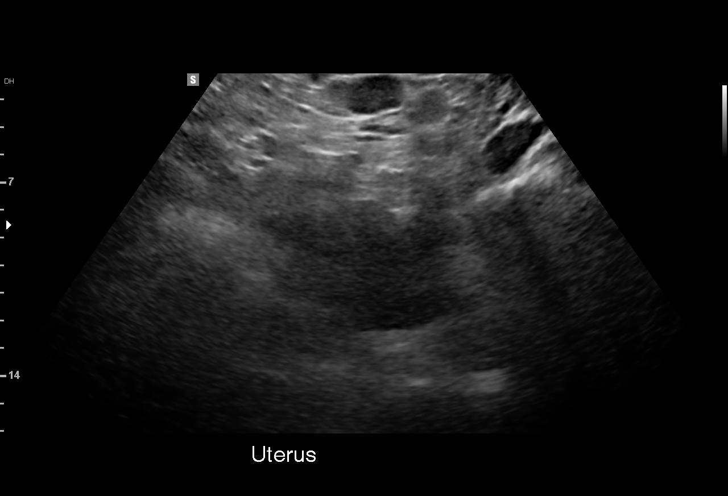
[im 13/44]
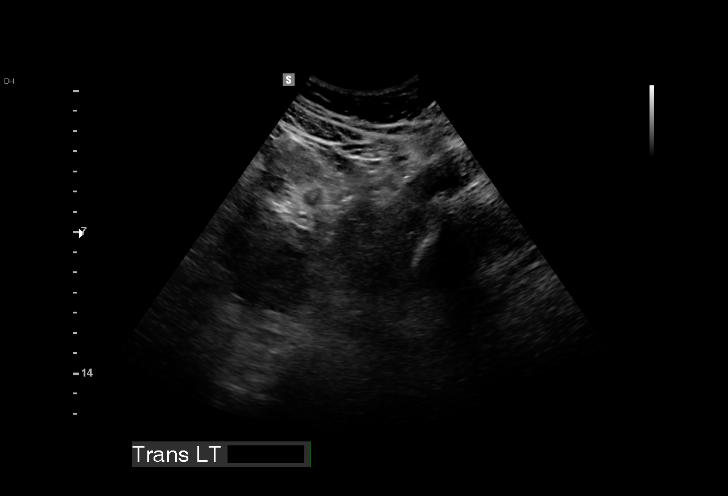
[im 17/44]
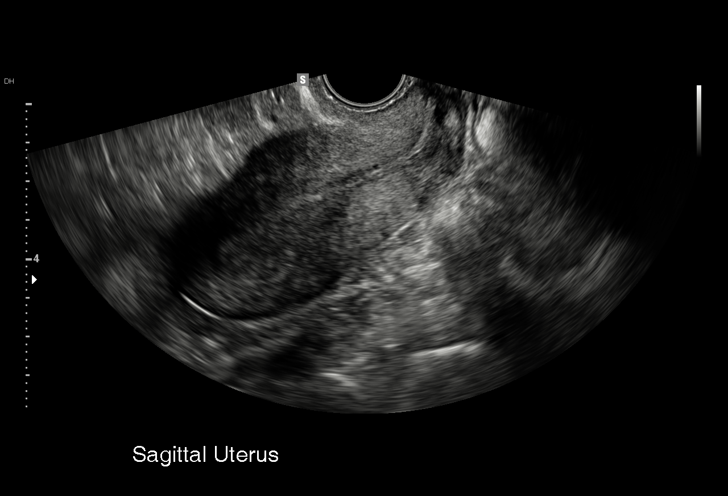
[im 18/44]
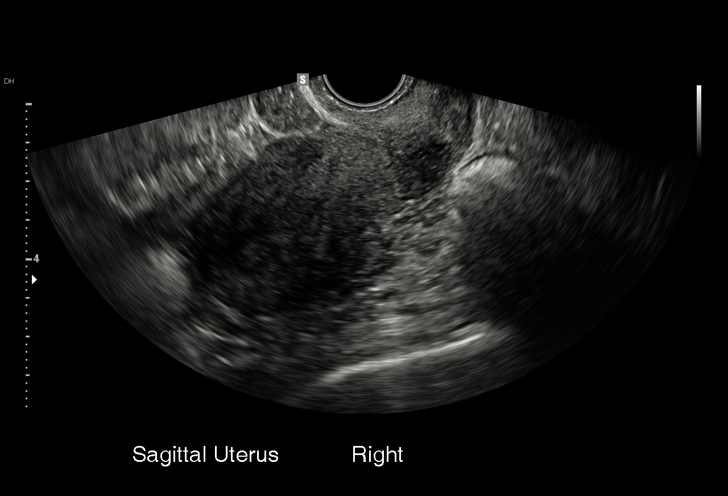
[im 22/44]
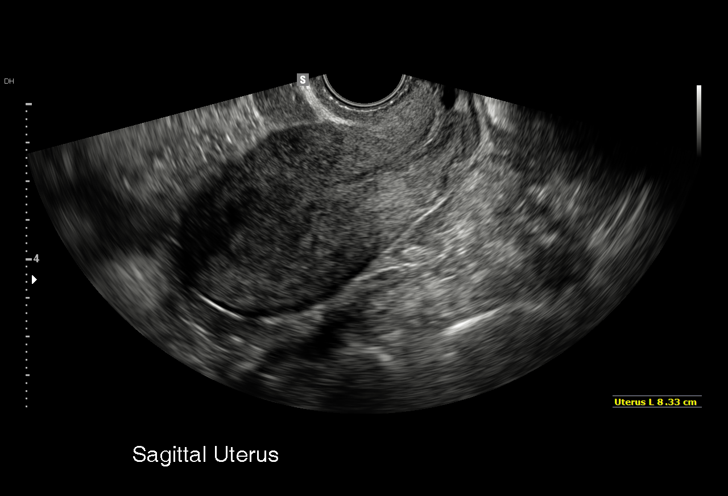
[im 26/44]
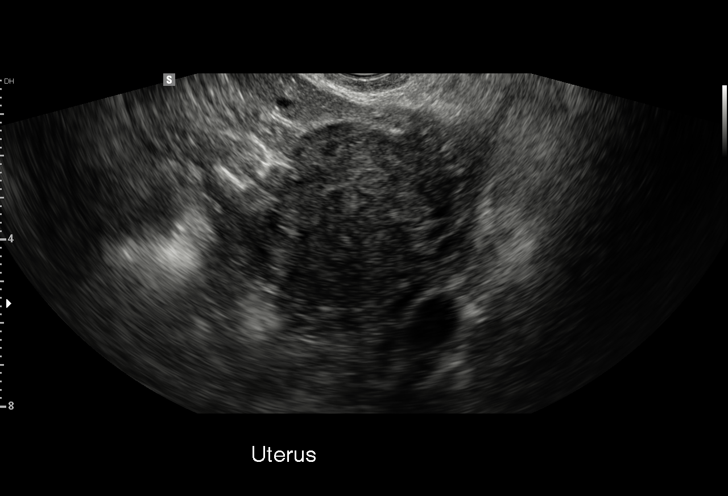
[im 27/44]
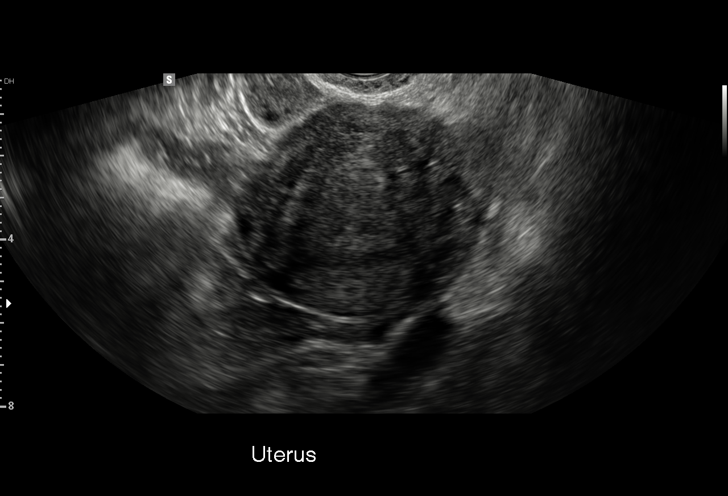
[im 31/44]
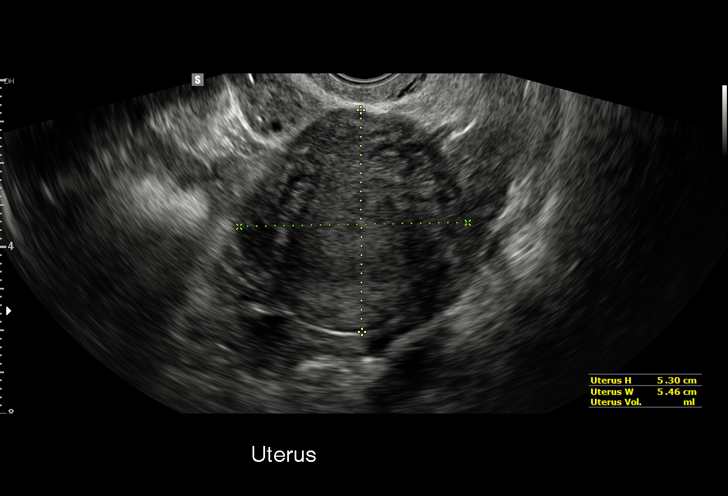
[im 35/44]
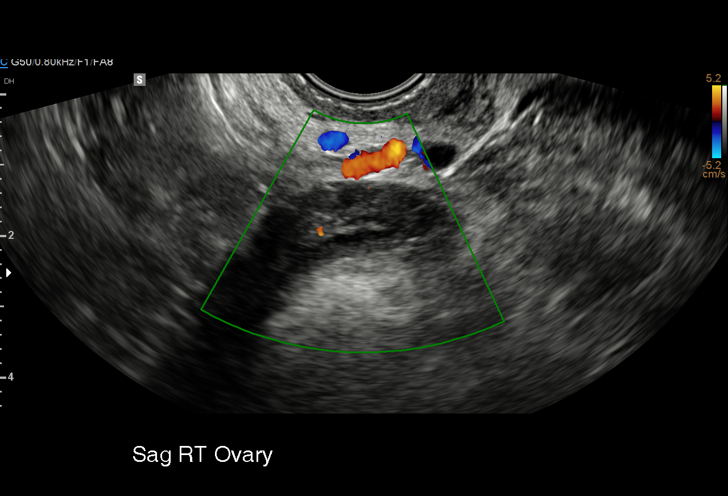
[im 36/44]
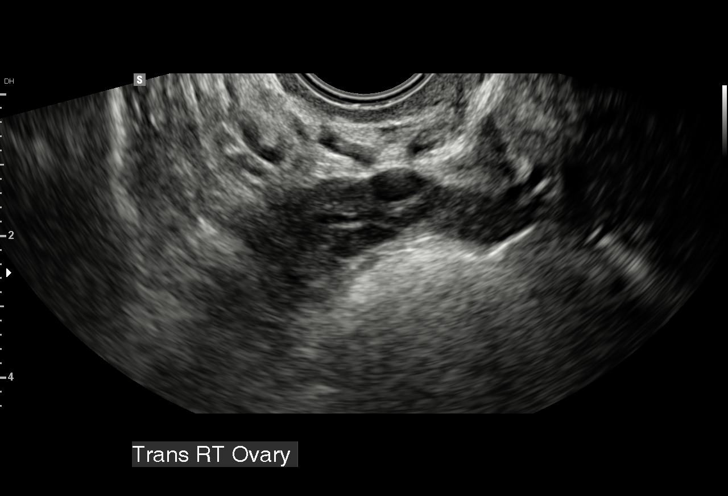
[im 40/44]
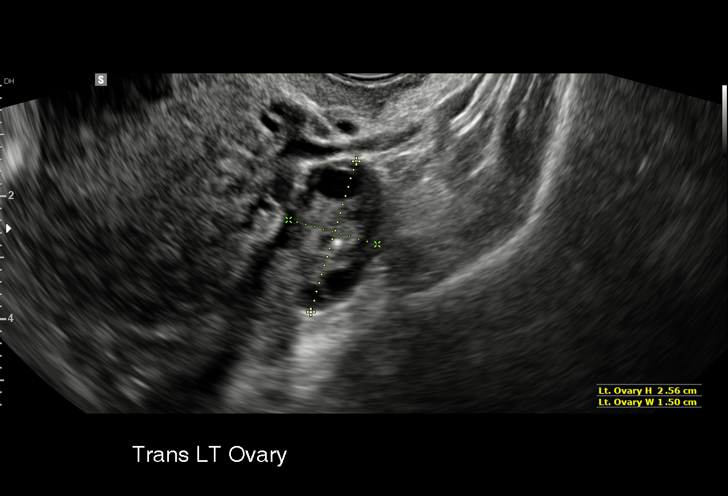
[im 44/44]
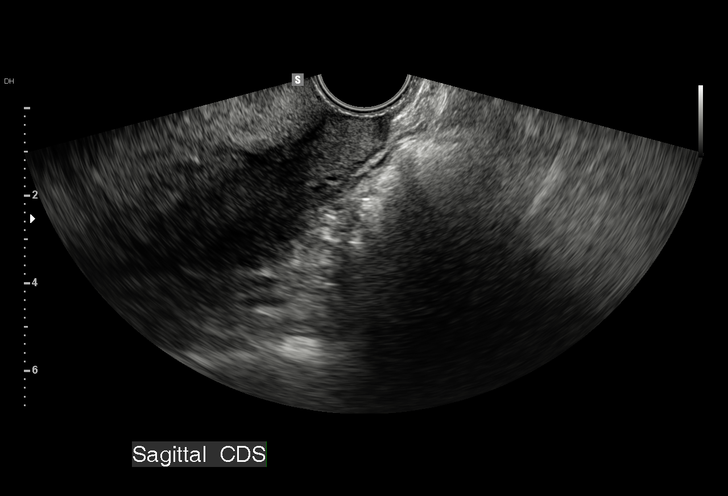

[15 of 25 positions shown; findings below may reference images not displayed]

FINDINGS: Uterus

Measurements: 8.3 x 5.2 x 5.5 cm. No fibroids or other mass
visualized.

Endometrium

Thickness: 7.3 mm.  No focal abnormality visualized.

Right ovary

Measurements: 2.6 x 1.0 x 2.1 cm. Normal appearance/no adnexal mass.

Left ovary

Measurements: 3.0 x 2.6 x 1.5 cm. Normal appearance/no adnexal mass.

Other findings

No abnormal free fluid.
IMPRESSION: No acute or focal abnormality.

## 2017-06-29 ENCOUNTER — Ambulatory Visit: Payer: Self-pay | Admitting: Internal Medicine

## 2017-06-30 ENCOUNTER — Encounter: Payer: Self-pay | Admitting: Student

## 2017-06-30 ENCOUNTER — Other Ambulatory Visit: Payer: Self-pay

## 2017-06-30 ENCOUNTER — Ambulatory Visit: Payer: BLUE CROSS/BLUE SHIELD | Admitting: Student

## 2017-06-30 ENCOUNTER — Other Ambulatory Visit (HOSPITAL_COMMUNITY)
Admission: RE | Admit: 2017-06-30 | Discharge: 2017-06-30 | Disposition: A | Discharge status: Home / Self Care | Payer: BLUE CROSS/BLUE SHIELD | Source: Ambulatory Visit | Attending: Family Medicine | Admitting: Family Medicine

## 2017-06-30 VITALS — BP 128/82 | HR 74 | Temp 98.1°F | Wt 203.0 lb

## 2017-06-30 DIAGNOSIS — N898 Other specified noninflammatory disorders of vagina: Secondary | ICD-10-CM | POA: Insufficient documentation

## 2017-06-30 DIAGNOSIS — M25561 Pain in right knee: Secondary | ICD-10-CM | POA: Diagnosis not present

## 2017-06-30 DIAGNOSIS — R0989 Other specified symptoms and signs involving the circulatory and respiratory systems: Secondary | ICD-10-CM

## 2017-06-30 LAB — POCT WET PREP (WET MOUNT)
CLUE CELLS WET PREP WHIFF POC: NEGATIVE
TRICHOMONAS WET PREP HPF POC: ABSENT

## 2017-06-30 MED ORDER — METRONIDAZOLE 500 MG PO TABS: 500.0000 mg | ORAL_TABLET | Freq: Two times a day (BID) | ORAL | 0 refills | Status: AC

## 2017-06-30 NOTE — Patient Instructions (Signed)
It was great seeing you today! We have addressed the following issues today   Knee pain: We ordered an x-ray.  [facility].  You do not need an appointment for this.  We also send a referral to sports medicine for further evaluation of the meniscus.  Vaginal discharge: We have done some tests today.  We will let you know if the tests are relevant.  Cough and chest congestion: This is a remnant of the common cold.  It should resolve on its own.  Continue tablespoon of honey before bedtime.  Continue good hydration.  Avoid using Afrin for more than 3 days total.  If we did any lab work today, and the results require attention, either me or my nurse will get in touch with you. If everything is normal, you will get a letter in mail and a message via . If you don't hear from us in two weeks, please give us a call. Otherwise, we look forward to seeing you again at your next visit. If you have any questions or concerns before then, please call the clinic at [phone].  Please bring all your medications to every doctors visit  Sign up for My Chart to have easy access to your labs results, and communication with your Primary care physician.    Please check-out at the front desk before leaving the clinic.    Take Care,   Dr. Alanda SlimGonfa

## 2017-06-30 NOTE — Progress Notes (Signed)
Subjective:    Kelsey Zavala is a 42 y.o. old female here for chest congestion, right knee pain and "bacterial vaginosis"  HPI Chest congestion: For 1 week.  Started with cold-like symptoms.  She had fever about 4 days ago.  She reports cough with greenish phlegm.  Denies chest pain or shortness of breath.  History of asthma.  Denies smoking cigarettes.  She reports using Afrin daily for the last 4 days.  Denies history of allergies.  Denies reflux symptoms.  Right knee pain: For 4 days.  She reports chronic history of right knee popping out for over a year.  Denies history of trauma or injury.  She also notes knee swelling at the end of the day.  Denies overlying skin change or erythema.  Denies locking or catching.  Denies history of blood.  Denies fever or chills.  "Bacterial vaginosis": Reports vaginal discharge.  The discharge is found smelling.  She also reports some pruritus.  Sexually active with her husband.  Denies skin lesion around the private area.  Denies dysuria, suprapubic pain or back pain.  Reports history of bacterial vaginosis in the past.  No recent antibiotic use.  PMH/Problem List: has Genital herpes; Obesity; Other and unspecified ovarian cyst; PROTEINURIA, MILD; TOBACCO USE, QUIT; Acne; Essential hypertension, benign; Polymyalgia (HCC); Morbid obesity (HCC); Dysmenorrhea; Grief reaction; Meralgia paraesthetica; Abnormal Pap smear of cervix; S/P laparoscopic sleeve gastrectomy Dec 2017; Vaginal odor; Rash and nonspecific skin eruption; and Hypertension on their problem list.   has a past medical history of GAD (generalized anxiety disorder), Genital herpes, History of abnormal Pap smear, and HTN (hypertension).  FH:  Family History  Problem Relation Age of Onset  . Heart attack Father   . Hypertension Father   . Diabetes Father   . Hypertension Mother   . Aneurysm Mother   . Autism Daughter   . Heart attack Cousin 39  . Diabetes Paternal Aunt   . Kidney failure Sister      [facility] Social History   Tobacco Use  . Smoking status: Never Smoker  . Smokeless tobacco: Never Used  Substance Use Topics  . Alcohol use: No  . Drug use: No    Review of Systems Review of systems negative except for pertinent positives and negatives in history of present illness above.     Objective:     Vitals:   06/30/17 1102  BP: 128/82  Pulse: 74  Temp: 98.1 F (36.7 C)  TempSrc: Oral  SpO2: 99%  Weight: 203 lb (92.1 kg)   Body mass index is 30.87 kg/m.  Physical Exam  GEN: appears well & comfortable. No apparent distress. Head: normocephalic and atraumatic  CVS: RRR, nl s1 & s2 RESP: no IWOB, clear to auscultation bilaterally GI: BS present & normal, soft, NTND GU:  Pelvic Exam No suprapubic or CVA tenerness External genitalia: normal without surrounding skin lesion, obvious discharge or bleeeding.  Speculum: pink vaginal mucosa, ruggated, normal cervix. Significant milky discharge. Bimanual: no cervical motion tenderness or adnexal mass. Uterus appears normal size  MSK:  Right knee Notable swelling compared to left knee.  No overlying skin erythema increased warmth to touch. Tenderness right below her joint line medially.  No joint line tenderness. ROM full in flexion and extension and lower leg rotation.  Patellar glide with crepitus. Ligaments with solid consistent endpoints including ACL, PCL, LCL, MCL.  However, significant pain with anterior drawer. Positive McMurray sign Non painful patellar compression. Normal gait  Normal sensation in right  leg, foot.   Neurovascularly intact with good distal pulses.  SKIN: no apparent skin lesion  Pelvic exam performed in the presence of chaperone (Page, CMA)    Assessment and Plan:  1. Chest congestion: this is likely due to viral bronchitis from her recent viral URI.  Lung exam within normal limits.  Recommended continuing a tablespoonful of honey and good hydration.  She can continue Mucinex.   Discussed return precautions.  She understands that cough might last up to 5 weeks before it completely resolves.  Recommended stopping Afrin.   2. Vaginal discharge: normal pelvic and speculum exam except for milky whitish discharge.  Surprisingly wet prep negative.  Will treat with Flagyl 500 mg twice daily for 7 days.  Advised not to drink alcohol while taking this medication.  GC/CT pending. - Cervicovaginal ancillary only  3. Acute pain of right knee:  history and exam concerning for meniscal injury.  She has positive McMurray and pain with anterior drawer but no ligament laxity.  She also have some amount of swelling in her right knee but no overlying skin erythema, skin bruise or increased warmth to touch to suggest gout or infectious etiology.  She has no constitutional symptoms either.  There might be some underlying osteoarthritis but she has no joint line tenderness other than crepitus.  She is also tender to palpation over past anserine bursa suggestive for bursitis.  Unfortunately she cannot take NSAIDs due to gastric sleeve.  We will obtain knee x-ray and refer to sports medicine clinic for further evaluation. - DG KNEE 3 VIEW RIGHT; Future  Return if symptoms worsen or fail to improve.  Almon Hercules, MD 06/30/17 Pager: [phone]

## 2017-07-01 LAB — CERVICOVAGINAL ANCILLARY ONLY
CHLAMYDIA, DNA PROBE: NEGATIVE
NEISSERIA GONORRHEA: NEGATIVE

## 2017-07-16 ENCOUNTER — Ambulatory Visit: Payer: BLUE CROSS/BLUE SHIELD | Admitting: Family Medicine

## 2017-07-23 ENCOUNTER — Ambulatory Visit: Payer: BLUE CROSS/BLUE SHIELD | Admitting: Family Medicine

## 2017-11-22 ENCOUNTER — Emergency Department (HOSPITAL_COMMUNITY)
Admission: EM | Admit: 2017-11-22 | Discharge: 2017-11-22 | Disposition: A | Discharge status: Home / Self Care | Payer: BLUE CROSS/BLUE SHIELD | Attending: Emergency Medicine | Admitting: Emergency Medicine

## 2017-11-22 ENCOUNTER — Encounter (HOSPITAL_COMMUNITY): Payer: Self-pay | Admitting: Emergency Medicine

## 2017-11-22 DIAGNOSIS — N12 Tubulo-interstitial nephritis, not specified as acute or chronic: Secondary | ICD-10-CM | POA: Diagnosis not present

## 2017-11-22 DIAGNOSIS — I10 Essential (primary) hypertension: Secondary | ICD-10-CM | POA: Diagnosis not present

## 2017-11-22 DIAGNOSIS — Z79899 Other long term (current) drug therapy: Secondary | ICD-10-CM | POA: Diagnosis not present

## 2017-11-22 DIAGNOSIS — E876 Hypokalemia: Secondary | ICD-10-CM | POA: Diagnosis not present

## 2017-11-22 DIAGNOSIS — R112 Nausea with vomiting, unspecified: Secondary | ICD-10-CM | POA: Diagnosis present

## 2017-11-22 LAB — URINALYSIS, ROUTINE W REFLEX MICROSCOPIC
BILIRUBIN URINE: NEGATIVE
GLUCOSE, UA: NEGATIVE mg/dL
Ketones, ur: NEGATIVE mg/dL
NITRITE: NEGATIVE
PH: 6 (ref 5.0–8.0)
Protein, ur: 100 mg/dL — AB
SPECIFIC GRAVITY, URINE: 1.015 (ref 1.005–1.030)
WBC, UA: >50 WBC/hpf — ABNORMAL HIGH (ref 0–5)

## 2017-11-22 LAB — CBC
HCT: 37.6 % (ref 36.0–46.0)
Hemoglobin: 11.8 g/dL — ABNORMAL LOW (ref 12.0–15.0)
MCH: 30.7 pg (ref 26.0–34.0)
MCHC: 31.4 g/dL (ref 30.0–36.0)
MCV: 97.9 fL (ref 80.0–100.0)
PLATELETS: 187 10*3/uL (ref 150–400)
RBC: 3.84 MIL/uL — ABNORMAL LOW (ref 3.87–5.11)
RDW: 13.2 % (ref 11.5–15.5)
WBC: 6 10*3/uL (ref 4.0–10.5)
nRBC: 0 % (ref 0.0–0.2)

## 2017-11-22 LAB — COMPREHENSIVE METABOLIC PANEL
ALT: 21 U/L (ref 0–44)
AST: 21 U/L (ref 15–41)
Albumin: 3.6 g/dL (ref 3.5–5.0)
Alkaline Phosphatase: 61 U/L (ref 38–126)
Anion gap: 6 (ref 5–15)
BILIRUBIN TOTAL: 0.6 mg/dL (ref 0.3–1.2)
BUN: 8 mg/dL (ref 6–20)
CALCIUM: 8.5 mg/dL — AB (ref 8.9–10.3)
CO2: 28 mmol/L (ref 22–32)
CREATININE: 0.89 mg/dL (ref 0.44–1.00)
Chloride: 103 mmol/L (ref 98–111)
GFR calc Af Amer: >60 mL/min (ref >60)
Glucose, Bld: 100 mg/dL — ABNORMAL HIGH (ref 70–99)
POTASSIUM: 2.9 mmol/L — AB (ref 3.5–5.1)
Sodium: 137 mmol/L (ref 135–145)
TOTAL PROTEIN: 7.4 g/dL (ref 6.5–8.1)

## 2017-11-22 LAB — I-STAT BETA HCG BLOOD, ED (MC, WL, AP ONLY): I-stat hCG, quantitative: <5 m[IU]/mL (ref <5)

## 2017-11-22 LAB — LIPASE, BLOOD: Lipase: 22 U/L (ref 11–51)

## 2017-11-22 MED ORDER — ACETAMINOPHEN 325 MG PO TABS
650.0000 mg | ORAL_TABLET | Freq: Once | ORAL | Status: AC
  Administered 2017-11-22: 650 mg via ORAL
  Filled 2017-11-22: qty 2

## 2017-11-22 MED ORDER — ONDANSETRON 8 MG PO TBDP: 8.0000 mg | ORAL_TABLET | Freq: Three times a day (TID) | ORAL | 0 refills | Status: AC | PRN

## 2017-11-22 MED ORDER — POTASSIUM CHLORIDE 20 MEQ PO PACK: 20.0000 meq | PACK | Freq: Two times a day (BID) | ORAL | 0 refills | Status: AC

## 2017-11-22 MED ORDER — FLUCONAZOLE 150 MG PO TABS
150.0000 mg | ORAL_TABLET | Freq: Once | ORAL | Status: AC
  Administered 2017-11-22: 150 mg via ORAL
  Filled 2017-11-22: qty 1

## 2017-11-22 MED ORDER — ONDANSETRON HCL 4 MG/2ML IJ SOLN
4.0000 mg | Freq: Once | INTRAMUSCULAR | Status: AC
  Administered 2017-11-22: 4 mg via INTRAVENOUS
  Filled 2017-11-22: qty 2

## 2017-11-22 MED ORDER — CEPHALEXIN 500 MG PO CAPS: 500.0000 mg | ORAL_CAPSULE | Freq: Three times a day (TID) | ORAL | 0 refills | Status: AC

## 2017-11-22 MED ORDER — SODIUM CHLORIDE 0.9 % IV BOLUS (SEPSIS)
1000.0000 mL | Freq: Once | INTRAVENOUS | Status: AC
  Administered 2017-11-22: 1000 mL via INTRAVENOUS

## 2017-11-22 MED ORDER — SODIUM CHLORIDE 0.9 % IV SOLN
1.0000 g | Freq: Once | INTRAVENOUS | Status: AC
  Administered 2017-11-22: 1 g via INTRAVENOUS
  Filled 2017-11-22: qty 10

## 2017-11-22 MED ORDER — POTASSIUM CHLORIDE 20 MEQ PO PACK
20.0000 meq | PACK | Freq: Once | ORAL | Status: AC
  Administered 2017-11-22: 20 meq via ORAL
  Filled 2017-11-22: qty 1

## 2017-11-22 MED ORDER — SODIUM CHLORIDE 0.9 % IV SOLN: 1000.0000 mL | INTRAVENOUS | Status: DC

## 2017-11-22 NOTE — ED Notes (Signed)
RN attempted to reassess temperature, but pt had just drank something cold, will reassess in approximately 15 minutes

## 2017-11-22 NOTE — ED Provider Notes (Signed)
[facility]-EMERGENCY DEPT Provider Note   CSN: [id number] Arrival date & time: 11/22/17  1332     History   Chief Complaint Chief Complaint  Patient presents with  . Abdominal Pain  . Diarrhea  . Emesis    HPI Kelsey Zavala is a 42 y.o. female.  HPI Pt started having symptoms on Friday.  It started with nausea and vomiting and abdominal cramping.  She then started having diarrhea and urinary frequency. She has been having fevers up to 103.  She has had kidney infections in the past and this feels similar.  She continues to vomit.  Last emesis was here in the ED.  Past Medical History:  Diagnosis Date  . GAD (generalized anxiety disorder)    OK NOW  . Genital herpes   . History of abnormal Pap smear   . HTN (hypertension)     Patient Active Problem List   Diagnosis Date Noted  . Rash and nonspecific skin eruption 08/25/2016  . Vaginal odor 07/22/2016  . S/P laparoscopic sleeve gastrectomy Dec 2017 12/25/2015  . Hypertension 05/01/2015  . Abnormal Pap smear of cervix 04/04/2015  . Meralgia paraesthetica 11/16/2014  . Dysmenorrhea 08/13/2014  . Grief reaction 08/13/2014  . Morbid obesity (HCC) 03/29/2014  . Polymyalgia (HCC) 12/13/2013  . Essential hypertension, benign 09/26/2013  . Acne 02/23/2011  . Other and unspecified ovarian cyst 09/25/2008  . PROTEINURIA, MILD 09/25/2008  . Genital herpes 08/16/2006  . Obesity 03/04/2006  . TOBACCO USE, QUIT 03/04/2006    Past Surgical History:  Procedure Laterality Date  . COLPOSCOPY    . LAPAROSCOPIC GASTRIC SLEEVE RESECTION N/A 12/25/2015   Procedure: LAPAROSCOPIC GASTRIC SLEEVE RESECTION, UPPER ENDO;  Surgeon: Luretha Murphy, MD;  Location: [facility];  Service: General;  Laterality: N/A;  . TUBAL LIGATION       OB History   None      Home Medications    Prior to Admission medications   Medication Sig Start Date End Date Taking? Authorizing Provider  acetaminophen (TYLENOL) 325 MG tablet  Take 650 mg by mouth every 6 (six) hours as needed for mild pain, moderate pain, fever or headache.    Yes [provider]  Calcium-Vitamin A-Vitamin D (LIQUID CALCIUM PO) Take 30 mLs by mouth daily.   Yes [provider]  Multiple Vitamins-Minerals (MULTIVITAMIN ADULTS) TABS Take 1 tablet by mouth daily.   Yes [provider]  cephALEXin (KEFLEX) 500 MG capsule Take 1 capsule (500 mg total) by mouth 3 (three) times daily for 7 days. 11/22/17 11/29/17  Linwood Dibbles, MD  ondansetron (ZOFRAN ODT) 8 MG disintegrating tablet Take 1 tablet (8 mg total) by mouth every 8 (eight) hours as needed for nausea or vomiting. 11/22/17   Linwood Dibbles, MD  potassium chloride (KLOR-CON) 20 MEQ packet Take 20 mEq by mouth 2 (two) times daily for 4 days. 11/22/17 11/26/17  Linwood Dibbles, MD  citalopram (CELEXA) 20 MG tablet Take 1 tablet (20 mg total) by mouth daily. 02/19/11 03/26/11  Tobey Grim, MD    Family History Family History  Problem Relation Age of Onset  . Heart attack Father   . Hypertension Father   . Diabetes Father   . Hypertension Mother   . Aneurysm Mother   . Autism Daughter   . Heart attack Cousin 39  . Diabetes Paternal Aunt   . Kidney failure Sister     Social History Social History   Tobacco Use  . Smoking  status: Never Smoker  . Smokeless tobacco: Never Used  Substance Use Topics  . Alcohol use: No  . Drug use: No     Allergies   Other and Norvasc [amlodipine besylate]   Review of Systems Review of Systems  All other systems reviewed and are negative.    Physical Exam Updated Vital Signs BP (!) 157/89   Pulse 78   Temp 99.6 F (37.6 C) (Oral)   Resp 18   LMP 11/16/2017   SpO2 100%   Physical Exam  Constitutional: She appears well-developed and well-nourished. No distress.  HENT:  Head: Normocephalic and atraumatic.  Right Ear: External ear normal.  Left Ear: External ear normal.  Eyes: Conjunctivae are normal. Right eye exhibits  no discharge. Left eye exhibits no discharge. No scleral icterus.  Neck: Neck supple. No tracheal deviation present.  Cardiovascular: Normal rate, regular rhythm and intact distal pulses.  Pulmonary/Chest: Effort normal and breath sounds normal. No stridor. No respiratory distress. She has no wheezes. She has no rales.  Abdominal: Soft. Bowel sounds are normal. She exhibits no distension. There is tenderness in the right upper quadrant. There is no rigidity, no rebound and no guarding. No hernia.  Musculoskeletal: She exhibits no edema or tenderness.  Neurological: She is alert. She has normal strength. No cranial nerve deficit (no facial droop, extraocular movements intact, no slurred speech) or sensory deficit. She exhibits normal muscle tone. She displays no seizure activity. Coordination normal.  Skin: Skin is warm and dry. No rash noted.  Psychiatric: She has a normal mood and affect.  Nursing note and vitals reviewed.    ED Treatments / Results  Labs (all labs ordered are listed, but only abnormal results are displayed) Labs Reviewed  COMPREHENSIVE METABOLIC PANEL - Abnormal; Notable for the following components:      Result Value   Potassium 2.9 (*)    Glucose, Bld 100 (*)    Calcium 8.5 (*)    All other components within normal limits  CBC - Abnormal; Notable for the following components:   RBC 3.84 (*)    Hemoglobin 11.8 (*)    All other components within normal limits  URINALYSIS, ROUTINE W REFLEX MICROSCOPIC - Abnormal; Notable for the following components:   APPearance CLOUDY (*)    Hgb urine dipstick MODERATE (*)    Protein, ur 100 (*)    Leukocytes, UA LARGE (*)    WBC, UA >50 (*)    Bacteria, UA MANY (*)    All other components within normal limits  URINE CULTURE  LIPASE, BLOOD  I-STAT BETA HCG BLOOD, ED ([facility], [facility], [facility] ONLY)    Procedures Procedures (including critical care time)  Medications Ordered in ED Medications  sodium chloride 0.9 % bolus 1,000 mL (0  mLs Intravenous Stopped 11/22/17 1605)    Followed by  sodium chloride 0.9 % bolus 1,000 mL (0 mLs Intravenous Stopped 11/22/17 1636)    Followed by  0.9 %  sodium chloride infusion (has no administration in time range)  cefTRIAXone (ROCEPHIN) 1 g in sodium chloride 0.9 % 100 mL IVPB (0 g Intravenous Stopped 11/22/17 1607)  acetaminophen (TYLENOL) tablet 650 mg (650 mg Oral Given 11/22/17 1525)  ondansetron (ZOFRAN) injection 4 mg (4 mg Intravenous Given 11/22/17 1537)  potassium chloride (KLOR-CON) packet 20 mEq (20 mEq Oral Given 11/22/17 1520)  fluconazole (DIFLUCAN) tablet 150 mg (150 mg Oral Given 11/22/17 1646)     Initial Impression / Assessment and Plan / ED Course  I have reviewed the triage vital signs and the nursing notes.  Pertinent labs & imaging results that were available during my care of the patient were reviewed by me and considered in my medical decision making (see chart for details).  Clinical Course as of Nov 22 1729  Mon Nov 22, 2017  1512 WBC normal. Hgb stable.  UA consistent with a uti.   [JK]    Clinical Course User Index [JK] Linwood Dibbles, MD   Patient presented to the emergency room for evaluation of fever, vomiting and abdominal pain.  Patient's laboratory tests are notable for urinary tract infection.  Her symptoms are consistent with pyelonephritis.  Patient was given IV fluids and antibiotics.  She was monitored in the emergency room and is able to tolerate fluids.  She is feeling better.  No signs of sepsis.  Plan on discharge home with antibiotics and antinausea medications as well as potassium supplements  Final Clinical Impressions(s) / ED Diagnoses   Final diagnoses:  Pyelonephritis  Hypertension, unspecified type  Hypokalemia    ED Discharge Orders         Ordered    potassium chloride (KLOR-CON) 20 MEQ packet  2 times daily     11/22/17 1729    cephALEXin (KEFLEX) 500 MG capsule  3 times daily     11/22/17 1729    ondansetron (ZOFRAN  ODT) 8 MG disintegrating tablet  Every 8 hours PRN     11/22/17 1729           Linwood Dibbles, MD 11/22/17 1731

## 2017-11-22 NOTE — ED Notes (Signed)
Urine culture collected in triage, sent with sample.

## 2017-11-22 NOTE — ED Triage Notes (Signed)
Pt c/o abd pains with n/v/d since Friday.

## 2017-11-22 NOTE — Discharge Instructions (Addendum)
Take the antibiotics as prescribed, follow-up with your primary care doctor to make sure you are improving later in the week, return to the emergency room for worsening symptoms, inability keep down your medications

## 2017-11-23 LAB — URINE CULTURE

## 2018-02-07 ENCOUNTER — Ambulatory Visit (HOSPITAL_COMMUNITY)
Admission: EM | Admit: 2018-02-07 | Discharge: 2018-02-07 | Disposition: A | Discharge status: Home / Self Care | Payer: BLUE CROSS/BLUE SHIELD | Attending: Family Medicine | Admitting: Family Medicine

## 2018-02-07 ENCOUNTER — Encounter (HOSPITAL_COMMUNITY): Payer: Self-pay | Admitting: Emergency Medicine

## 2018-02-07 DIAGNOSIS — L739 Follicular disorder, unspecified: Secondary | ICD-10-CM

## 2018-02-07 MED ORDER — MUPIROCIN CALCIUM 2 % EX CREA: 1.0000 "application " | TOPICAL_CREAM | Freq: Two times a day (BID) | CUTANEOUS | 0 refills | Status: AC

## 2018-02-07 MED ORDER — FLUCONAZOLE 150 MG PO TABS: 150.0000 mg | ORAL_TABLET | Freq: Every day | ORAL | 0 refills | Status: AC

## 2018-02-07 NOTE — Discharge Instructions (Signed)
Apply the Bactroban cream twice a day to the area I am sending in Diflucan for yeast take 1 tab now and 1 tab in 3 days if still having symptoms You can take the Benadryl as needed for itching Follow up as needed for continued or worsening symptoms

## 2018-02-07 NOTE — ED Triage Notes (Signed)
Pt here for vaginal irritation after having wax

## 2018-02-08 NOTE — ED Provider Notes (Addendum)
[facility]    CSN: [id number] Arrival date & time: 02/07/18  1738     History   Chief Complaint Chief Complaint  Patient presents with  . Vaginal Injury    HPI Kelsey Zavala is a 43 y.o. female.   Pt is a 43 year old female that presents with vaginal itching, irritation and bumps in vaginal area. This started and has worsened since having hair removal. She reports it started as a few bumps and has now spread throughout the entire external vaginal area. Severe pruritis. She has been placing hydrocortisone cream and taking benadryl with some relief. No drainage, fever, chills, myalgias. She is sexually active with one partner for many years and denies any concern for STDs.   ROS per HPI      Past Medical History:  Diagnosis Date  . GAD (generalized anxiety disorder)    OK NOW  . Genital herpes   . History of abnormal Pap smear   . HTN (hypertension)     Patient Active Problem List   Diagnosis Date Noted  . Rash and nonspecific skin eruption 08/25/2016  . Vaginal odor 07/22/2016  . S/P laparoscopic sleeve gastrectomy Dec 2017 12/25/2015  . Hypertension 05/01/2015  . Abnormal Pap smear of cervix 04/04/2015  . Meralgia paraesthetica 11/16/2014  . Dysmenorrhea 08/13/2014  . Grief reaction 08/13/2014  . Morbid obesity (HCC) 03/29/2014  . Polymyalgia (HCC) 12/13/2013  . Essential hypertension, benign 09/26/2013  . Acne 02/23/2011  . Other and unspecified ovarian cyst 09/25/2008  . PROTEINURIA, MILD 09/25/2008  . Genital herpes 08/16/2006  . Obesity 03/04/2006  . TOBACCO USE, QUIT 03/04/2006    Past Surgical History:  Procedure Laterality Date  . COLPOSCOPY    . LAPAROSCOPIC GASTRIC SLEEVE RESECTION N/A 12/25/2015   Procedure: LAPAROSCOPIC GASTRIC SLEEVE RESECTION, UPPER ENDO;  Surgeon: Luretha Murphy, MD;  Location: [facility];  Service: General;  Laterality: N/A;  . TUBAL LIGATION      OB History   No obstetric history on file.      Home  Medications    Prior to Admission medications   Medication Sig Start Date End Date Taking? Authorizing Provider  acetaminophen (TYLENOL) 325 MG tablet Take 650 mg by mouth every 6 (six) hours as needed for mild pain, moderate pain, fever or headache.     [provider]  Calcium-Vitamin A-Vitamin D (LIQUID CALCIUM PO) Take 30 mLs by mouth daily.    [provider]  fluconazole (DIFLUCAN) 150 MG tablet Take 1 tablet (150 mg total) by mouth daily. 02/07/18   Dahlia Byes A, NP  Multiple Vitamins-Minerals (MULTIVITAMIN ADULTS) TABS Take 1 tablet by mouth daily.    [provider]  mupirocin cream (BACTROBAN) 2 % Apply 1 application topically 2 (two) times daily. 02/07/18   Andrik Sandt, Gloris Manchester A, NP  ondansetron (ZOFRAN ODT) 8 MG disintegrating tablet Take 1 tablet (8 mg total) by mouth every 8 (eight) hours as needed for nausea or vomiting. 11/22/17   Linwood Dibbles, MD  potassium chloride (KLOR-CON) 20 MEQ packet Take 20 mEq by mouth 2 (two) times daily for 4 days. 11/22/17 11/26/17  Linwood Dibbles, MD  citalopram (CELEXA) 20 MG tablet Take 1 tablet (20 mg total) by mouth daily. 02/19/11 03/26/11  Tobey Grim, MD    Family History Family History  Problem Relation Age of Onset  . Heart attack Father   . Hypertension Father   . Diabetes Father   . Hypertension Mother   .  Aneurysm Mother   . Autism Daughter   . Heart attack Cousin 39  . Diabetes Paternal Aunt   . Kidney failure Sister     Social History Social History   Tobacco Use  . Smoking status: Never Smoker  . Smokeless tobacco: Never Used  Substance Use Topics  . Alcohol use: No  . Drug use: No     Allergies   Other and Norvasc [amlodipine besylate]   Review of Systems Review of Systems   Physical Exam Triage Vital Signs ED Triage Vitals [02/07/18 1840]  Enc Vitals Group     BP (!) 170/89     Pulse Rate 81     Resp 18     Temp 98.3 F (36.8 C)     Temp Source Temporal     SpO2 100 %     Weight        Height      Head Circumference      Peak Flow      Pain Score 6     Pain Loc      Pain Edu?      Excl. in GC?    No data found.  Updated Vital Signs BP (!) 170/89 (BP Location: Right Arm)   Pulse 81   Temp 98.3 F (36.8 C) (Temporal)   Resp 18   SpO2 100%   Visual Acuity Right Eye Distance:   Left Eye Distance:   Bilateral Distance:    Right Eye Near:   Left Eye Near:    Bilateral Near:     Physical Exam Vitals signs and nursing note reviewed.  Constitutional:      General: She is not in acute distress.    Appearance: Normal appearance. She is well-developed. She is not ill-appearing.  HENT:     Head: Normocephalic and atraumatic.     Nose: Nose normal.  Eyes:     Conjunctiva/sclera: Conjunctivae normal.  Cardiovascular:     Heart sounds: No murmur.  Pulmonary:     Effort: Pulmonary effort is normal.  Abdominal:     Palpations: Abdomen is soft.     Tenderness: There is no abdominal tenderness.  Genitourinary:    Vagina: No vaginal discharge.     Comments: Multiple papules to mons pubis No drainage.  Slightly erythematous.  Skin:    General: Skin is warm and dry.  Neurological:     Mental Status: She is alert.  Psychiatric:        Mood and Affect: Mood normal.      UC Treatments / Results  Labs (all labs ordered are listed, but only abnormal results are displayed) Labs Reviewed - No data to display  EKG None  Radiology No results found.  Procedures Procedures (including critical care time)  Medications Ordered in UC Medications - No data to display  Initial Impression / Assessment and Plan / UC Course  I have reviewed the triage vital signs and the nursing notes.  Pertinent labs & imaging results that were available during my care of the patient were reviewed by me and considered in my medical decision making (see chart for details).     Symptoms consistent with folliculitis Will cover for bacteria and yeast based on the area  that is infected Bactroban cream 2 x a day and diflucan Follow up as needed for continued or worsening symptoms Instructed not to shave.  Final Clinical Impressions(s) / UC Diagnoses   Final diagnoses:  Folliculitis  Discharge Instructions     Apply the Bactroban cream twice a day to the area I am sending in Diflucan for yeast take 1 tab now and 1 tab in 3 days if still having symptoms You can take the Benadryl as needed for itching Follow up as needed for continued or worsening symptoms     ED Prescriptions    Medication Sig Dispense Auth. Provider   mupirocin cream (BACTROBAN) 2 % Apply 1 application topically 2 (two) times daily. 15 g Erasmo Vertz A, NP   fluconazole (DIFLUCAN) 150 MG tablet Take 1 tablet (150 mg total) by mouth daily. 2 tablet Janace Aris, NP     Controlled Substance Prescriptions [place] Controlled Substance Registry consulted? no   Janace Aris, NP 02/08/18 1324    Dahlia Byes A, NP 02/08/18 1325

## 2018-02-24 IMAGING — CR DG ABDOMEN 1V
2 series · 2 of 2 positions shown · non-contrast
Comparison: None.

CLINICAL DATA: Nausea and vomiting for 1 day, dehydrated. Status
post gastric sleeve December 2015.

EXAM:
ABDOMEN - 1 VIEW

[t abdomen supine (1 of 2)]
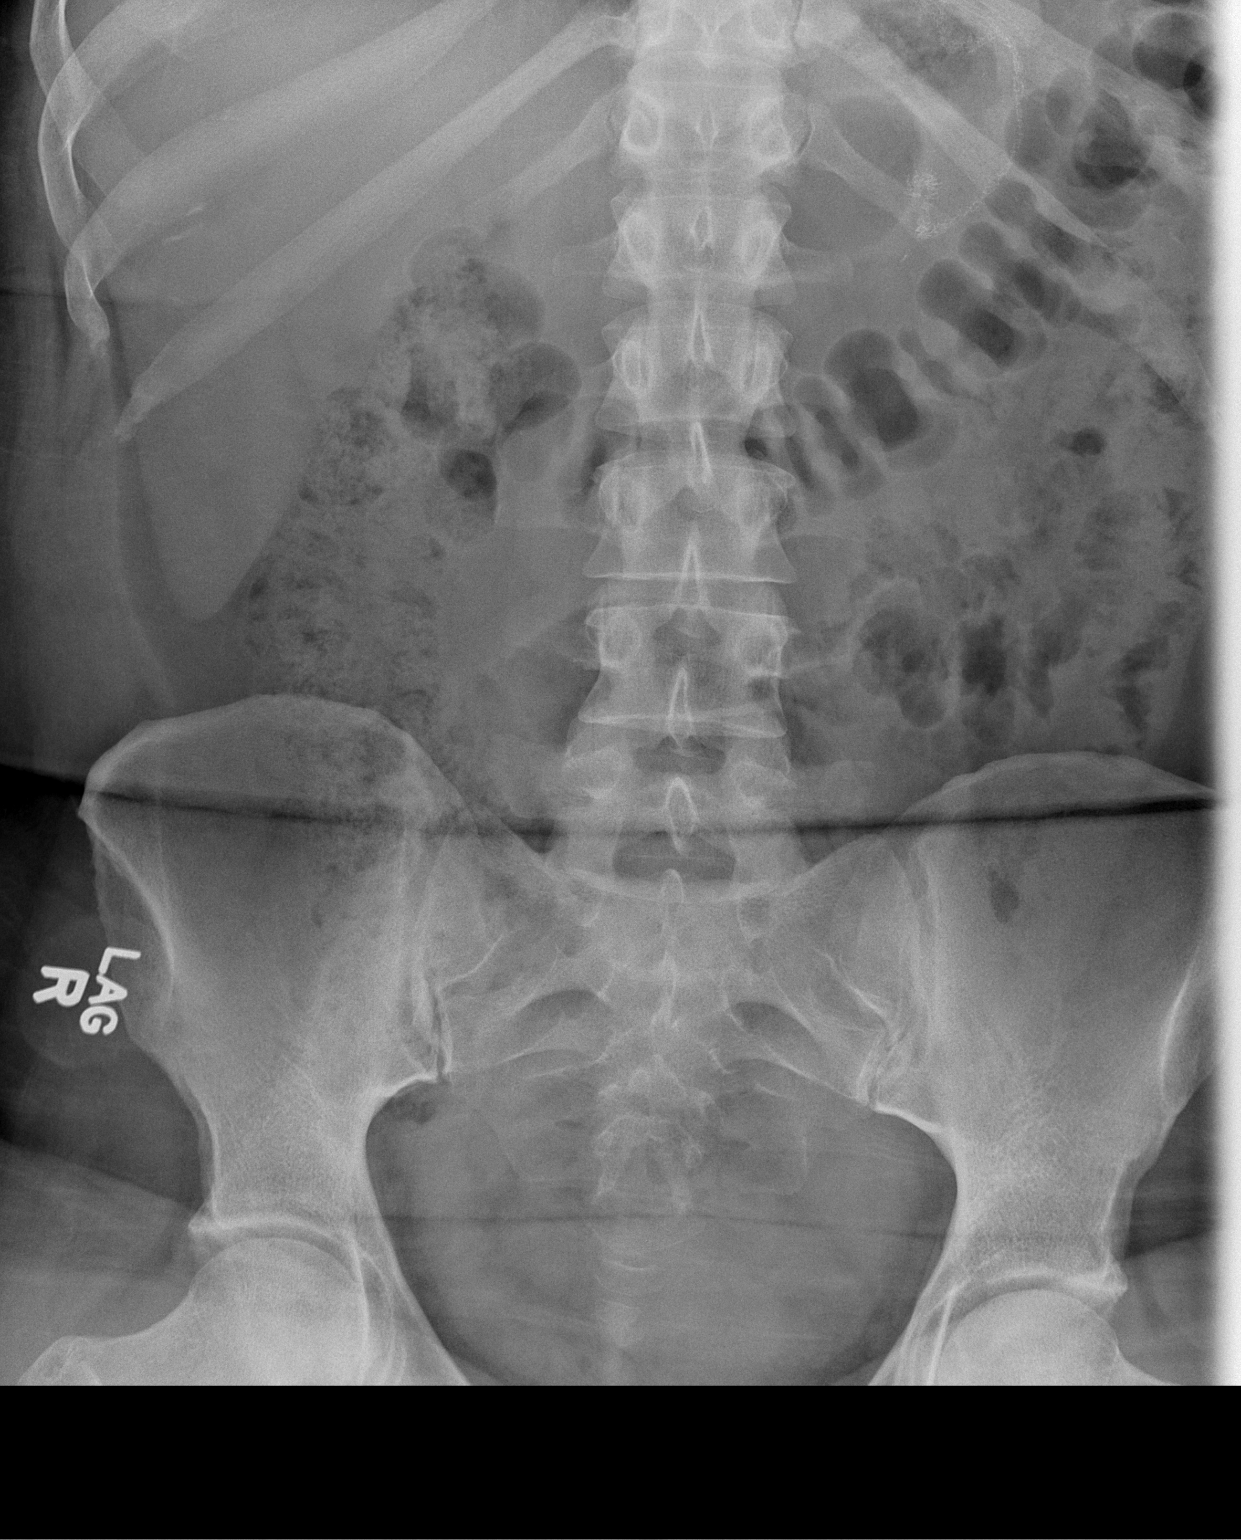

[t abdomen supine (2 of 2)]
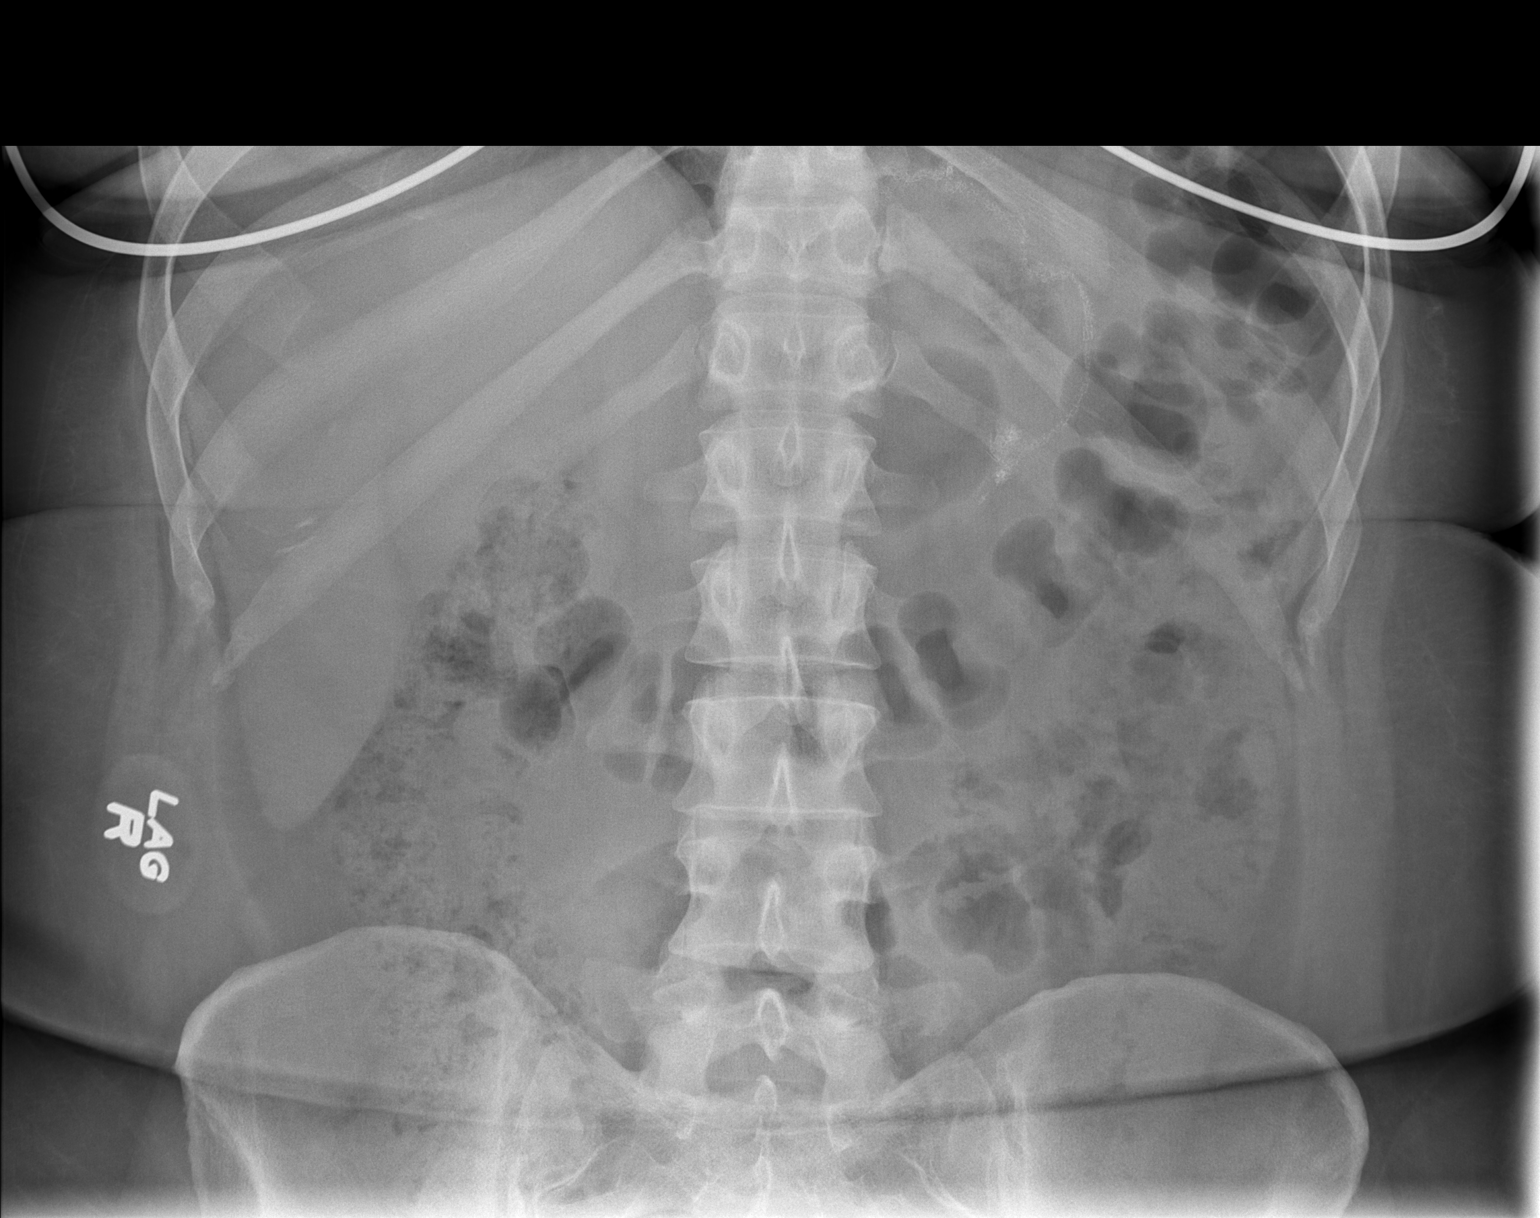

[2 of 2 positions shown; findings below may reference images not displayed]

FINDINGS: The bowel gas pattern is normal. No radio-opaque calculi or other
significant radiographic abnormality are seen. Surgical staples LEFT
upper quadrant consistent with surgical history.
IMPRESSION: Normal bowel gas pattern.

## 2018-10-05 ENCOUNTER — Encounter (HOSPITAL_COMMUNITY): Payer: Self-pay

## 2018-12-16 ENCOUNTER — Emergency Department (HOSPITAL_COMMUNITY)
Admission: EM | Admit: 2018-12-16 | Discharge: 2018-12-17 | Disposition: A | Discharge status: Home / Self Care | Payer: BLUE CROSS/BLUE SHIELD | Attending: Emergency Medicine | Admitting: Emergency Medicine

## 2018-12-16 ENCOUNTER — Other Ambulatory Visit: Payer: Self-pay

## 2018-12-16 ENCOUNTER — Encounter (HOSPITAL_COMMUNITY): Payer: Self-pay | Admitting: Emergency Medicine

## 2018-12-16 DIAGNOSIS — R2242 Localized swelling, mass and lump, left lower limb: Secondary | ICD-10-CM | POA: Insufficient documentation

## 2018-12-16 DIAGNOSIS — I10 Essential (primary) hypertension: Secondary | ICD-10-CM | POA: Insufficient documentation

## 2018-12-16 DIAGNOSIS — Z5321 Procedure and treatment not carried out due to patient leaving prior to being seen by health care provider: Secondary | ICD-10-CM | POA: Insufficient documentation

## 2018-12-16 LAB — I-STAT BETA HCG BLOOD, ED (MC, WL, AP ONLY): I-stat hCG, quantitative: <5 m[IU]/mL (ref <5)

## 2018-12-16 LAB — CBC
HCT: 37 % (ref 36.0–46.0)
Hemoglobin: 11.7 g/dL — ABNORMAL LOW (ref 12.0–15.0)
MCH: 31.2 pg (ref 26.0–34.0)
MCHC: 31.6 g/dL (ref 30.0–36.0)
MCV: 98.7 fL (ref 80.0–100.0)
Platelets: 279 10*3/uL (ref 150–400)
RBC: 3.75 MIL/uL — ABNORMAL LOW (ref 3.87–5.11)
RDW: 12.6 % (ref 11.5–15.5)
WBC: 3.7 10*3/uL — ABNORMAL LOW (ref 4.0–10.5)
nRBC: 0 % (ref 0.0–0.2)

## 2018-12-16 LAB — BASIC METABOLIC PANEL
Anion gap: 6 (ref 5–15)
BUN: 13 mg/dL (ref 6–20)
CO2: 26 mmol/L (ref 22–32)
Calcium: 9 mg/dL (ref 8.9–10.3)
Chloride: 108 mmol/L (ref 98–111)
Creatinine, Ser: 0.82 mg/dL (ref 0.44–1.00)
GFR calc Af Amer: >60 mL/min (ref >60)
GFR calc non Af Amer: >60 mL/min (ref >60)
Glucose, Bld: 91 mg/dL (ref 70–99)
Potassium: 3.6 mmol/L (ref 3.5–5.1)
Sodium: 140 mmol/L (ref 135–145)

## 2018-12-16 NOTE — ED Notes (Signed)
Called pt to be roomed x3 and had no response.

## 2018-12-16 NOTE — ED Triage Notes (Signed)
Patient presents ambulatory stating she was sent home from work because her BP was 188/81 and she was very clammy and weak. Patient adds her period has been on for 11 days which is not usual for her. Also has some kind of lump on the left of groin. Patient also having swelling in her foot.

## 2018-12-16 NOTE — ED Notes (Signed)
Pt has walked outside.  

## 2019-07-21 ENCOUNTER — Encounter (HOSPITAL_COMMUNITY): Payer: Self-pay

## 2019-10-01 ENCOUNTER — Other Ambulatory Visit: Payer: Self-pay

## 2019-10-01 DIAGNOSIS — M791 Myalgia, unspecified site: Secondary | ICD-10-CM | POA: Insufficient documentation

## 2019-10-01 DIAGNOSIS — M545 Low back pain: Secondary | ICD-10-CM | POA: Insufficient documentation

## 2019-10-01 DIAGNOSIS — Z5321 Procedure and treatment not carried out due to patient leaving prior to being seen by health care provider: Secondary | ICD-10-CM | POA: Insufficient documentation

## 2019-10-01 DIAGNOSIS — R6883 Chills (without fever): Secondary | ICD-10-CM | POA: Insufficient documentation

## 2019-10-01 NOTE — ED Triage Notes (Signed)
Patient reports to the ER for bodyaches and cold chills. 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

## 2019-10-02 ENCOUNTER — Emergency Department (HOSPITAL_COMMUNITY)
Admission: EM | Admit: 2019-10-02 | Discharge: 2019-10-02 | Disposition: A | Discharge status: Home / Self Care | Payer: Self-pay | Attending: Emergency Medicine | Admitting: Emergency Medicine

## 2019-10-03 ENCOUNTER — Other Ambulatory Visit: Payer: Medicaid Other

## 2019-10-03 DIAGNOSIS — Z20822 Contact with and (suspected) exposure to covid-19: Secondary | ICD-10-CM

## 2019-10-04 LAB — NOVEL CORONAVIRUS, NAA: SARS-CoV-2, NAA: DETECTED — AB

## 2019-10-04 LAB — SARS-COV-2, NAA 2 DAY TAT

## 2019-10-05 ENCOUNTER — Telehealth (HOSPITAL_COMMUNITY): Payer: Self-pay | Admitting: Adult Health

## 2019-10-05 ENCOUNTER — Other Ambulatory Visit: Payer: Self-pay | Admitting: Critical Care Medicine

## 2019-10-05 DIAGNOSIS — I10 Essential (primary) hypertension: Secondary | ICD-10-CM

## 2019-10-05 DIAGNOSIS — U071 COVID-19: Secondary | ICD-10-CM

## 2019-10-05 NOTE — Progress Notes (Signed)
I connected by phone with Kelsey Zavala on 10/05/2019 at 8:12 PM to discuss the potential use of a new treatment for mild to moderate COVID-19 viral infection in non-hospitalized patients.  This patient is a 44 y.o. female that meets the FDA criteria for Emergency Use Authorization of COVID monoclonal antibody casirivimab/imdevimab or bamlanivimab/eteseviamb.  Has a (+) direct SARS-CoV-2 viral test result  Has mild or moderate COVID-19   Is NOT hospitalized due to COVID-19  Is within 10 days of symptom onset  Has at least one of the high risk factor(s) for progression to severe COVID-19 and/or hospitalization as defined in EUA.  Specific high risk criteria : BMI > 25 and Cardiovascular disease or hypertension   I have spoken and communicated the following to the patient or parent/caregiver regarding COVID monoclonal antibody treatment:  1. FDA has authorized the emergency use for the treatment of mild to moderate COVID-19 in adults and pediatric patients with positive results of direct SARS-CoV-2 viral testing who are 74 years of age and older weighing at least 40 kg, and who are at high risk for progressing to severe COVID-19 and/or hospitalization.  2. The significant known and potential risks and benefits of COVID monoclonal antibody, and the extent to which such potential risks and benefits are unknown.  3. Information on available alternative treatments and the risks and benefits of those alternatives, including clinical trials.  4. Patients treated with COVID monoclonal antibody should continue to self-isolate and use infection control measures (e.g., wear mask, isolate, social distance, avoid sharing personal items, clean and disinfect "high touch" surfaces, and frequent handwashing) according to CDC guidelines.   5. The patient or parent/caregiver has the option to accept or refuse COVID monoclonal antibody treatment.  After reviewing this information with the patient, the patient  has agreed to receive one of the available covid 19 monoclonal antibodies and will be provided an appropriate fact sheet prior to infusion. [name], MD 10/05/2019 8:12 PM

## 2019-10-05 NOTE — Telephone Encounter (Signed)
Called and LMOM regarding monoclonal antibody treatment for COVID 19 given to those who are at risk for complications and/or hospitalization of the virus.  Patient meets criteria based on: BMI greater than 25  Call back number given: [phone]  My chart message: sent  Lazarus Sudbury, NP  

## 2019-10-06 ENCOUNTER — Ambulatory Visit (HOSPITAL_COMMUNITY)
Admission: RE | Admit: 2019-10-06 | Discharge: 2019-10-06 | Disposition: A | Discharge status: Home / Self Care | Payer: HRSA Program | Source: Ambulatory Visit | Attending: Pulmonary Disease | Admitting: Pulmonary Disease

## 2019-10-06 DIAGNOSIS — I10 Essential (primary) hypertension: Secondary | ICD-10-CM | POA: Insufficient documentation

## 2019-10-06 DIAGNOSIS — U071 COVID-19: Secondary | ICD-10-CM | POA: Diagnosis not present

## 2019-10-06 MED ORDER — SODIUM CHLORIDE 0.9 % IV SOLN
1200.0000 mg | Freq: Once | INTRAVENOUS | Status: AC
  Administered 2019-10-06: 1200 mg via INTRAVENOUS

## 2019-10-06 MED ORDER — SODIUM CHLORIDE 0.9 % IV SOLN: INTRAVENOUS | Status: DC | PRN

## 2019-10-06 MED ORDER — METHYLPREDNISOLONE SODIUM SUCC 125 MG IJ SOLR: 125.0000 mg | Freq: Once | INTRAMUSCULAR | Status: DC | PRN

## 2019-10-06 MED ORDER — FAMOTIDINE IN NACL 20-0.9 MG/50ML-% IV SOLN: 20.0000 mg | Freq: Once | INTRAVENOUS | Status: DC | PRN

## 2019-10-06 MED ORDER — EPINEPHRINE 0.3 MG/0.3ML IJ SOAJ: 0.3000 mg | Freq: Once | INTRAMUSCULAR | Status: DC | PRN

## 2019-10-06 MED ORDER — DIPHENHYDRAMINE HCL 50 MG/ML IJ SOLN: 50.0000 mg | Freq: Once | INTRAMUSCULAR | Status: DC | PRN

## 2019-10-06 MED ORDER — ALBUTEROL SULFATE HFA 108 (90 BASE) MCG/ACT IN AERS: 2.0000 | INHALATION_SPRAY | Freq: Once | RESPIRATORY_TRACT | Status: DC | PRN

## 2019-10-06 NOTE — Progress Notes (Signed)
  Diagnosis: COVID-19  Physician: Wright, MD  Procedure: [facility] Med: casirivimab\imdevimab infusion - Provided patient with casirivimab\imdevimab fact sheet for patients, parents and caregivers prior to infusion.  Complications: No immediate complications noted.  Discharge: Discharged home   Navjot Loera R Shakiyah Cirilo 10/06/2019   

## 2019-10-06 NOTE — Discharge Instructions (Signed)

## 2020-01-18 DIAGNOSIS — Z3009 Encounter for other general counseling and advice on contraception: Secondary | ICD-10-CM | POA: Diagnosis not present

## 2020-01-18 DIAGNOSIS — Z0389 Encounter for observation for other suspected diseases and conditions ruled out: Secondary | ICD-10-CM | POA: Diagnosis not present

## 2020-01-18 DIAGNOSIS — Z1388 Encounter for screening for disorder due to exposure to contaminants: Secondary | ICD-10-CM | POA: Diagnosis not present

## 2020-04-11 DIAGNOSIS — Z20822 Contact with and (suspected) exposure to covid-19: Secondary | ICD-10-CM | POA: Diagnosis not present

## 2020-07-23 ENCOUNTER — Encounter (HOSPITAL_COMMUNITY): Payer: Self-pay | Admitting: *Deleted

## 2020-07-26 ENCOUNTER — Emergency Department (HOSPITAL_COMMUNITY): Payer: Medicaid Other

## 2020-07-26 ENCOUNTER — Emergency Department (HOSPITAL_COMMUNITY)
Admission: EM | Admit: 2020-07-26 | Discharge: 2020-07-26 | Disposition: A | Discharge status: Home / Self Care | Payer: Medicaid Other | Attending: Emergency Medicine | Admitting: Emergency Medicine

## 2020-07-26 ENCOUNTER — Encounter (HOSPITAL_COMMUNITY): Payer: Self-pay

## 2020-07-26 ENCOUNTER — Other Ambulatory Visit: Payer: Self-pay

## 2020-07-26 DIAGNOSIS — R202 Paresthesia of skin: Secondary | ICD-10-CM | POA: Insufficient documentation

## 2020-07-26 DIAGNOSIS — R0789 Other chest pain: Secondary | ICD-10-CM | POA: Diagnosis not present

## 2020-07-26 DIAGNOSIS — R519 Headache, unspecified: Secondary | ICD-10-CM | POA: Diagnosis not present

## 2020-07-26 DIAGNOSIS — R079 Chest pain, unspecified: Secondary | ICD-10-CM

## 2020-07-26 DIAGNOSIS — Z5321 Procedure and treatment not carried out due to patient leaving prior to being seen by health care provider: Secondary | ICD-10-CM | POA: Diagnosis not present

## 2020-07-26 LAB — CBC
HCT: 38.4 % (ref 36.0–46.0)
Hemoglobin: 12.6 g/dL (ref 12.0–15.0)
MCH: 32.5 pg (ref 26.0–34.0)
MCHC: 32.8 g/dL (ref 30.0–36.0)
MCV: 99 fL (ref 80.0–100.0)
Platelets: 220 10*3/uL (ref 150–400)
RBC: 3.88 MIL/uL (ref 3.87–5.11)
RDW: 12.6 % (ref 11.5–15.5)
WBC: 3.5 10*3/uL — ABNORMAL LOW (ref 4.0–10.5)
nRBC: 0 % (ref 0.0–0.2)

## 2020-07-26 LAB — BASIC METABOLIC PANEL
Anion gap: 6 (ref 5–15)
BUN: 13 mg/dL (ref 6–20)
CO2: 26 mmol/L (ref 22–32)
Calcium: 9.2 mg/dL (ref 8.9–10.3)
Chloride: 107 mmol/L (ref 98–111)
Creatinine, Ser: 0.81 mg/dL (ref 0.44–1.00)
GFR, Estimated: >60 mL/min (ref >60)
Glucose, Bld: 94 mg/dL (ref 70–99)
Potassium: 3.6 mmol/L (ref 3.5–5.1)
Sodium: 139 mmol/L (ref 135–145)

## 2020-07-26 LAB — I-STAT BETA HCG BLOOD, ED (MC, WL, AP ONLY): I-stat hCG, quantitative: <5 m[IU]/mL (ref <5)

## 2020-07-26 LAB — TROPONIN I (HIGH SENSITIVITY): Troponin I (High Sensitivity): 3 ng/L (ref <18)

## 2020-07-26 NOTE — ED Triage Notes (Signed)
Patient c/o left arm numbness and left chest pain x 3 days. Patient states it is the worst. Patient states "I could not answer the lady at the desk." And states "I could not figure out how to get down the stairs today.  Patient alert and oriented in triage.

## 2020-07-26 NOTE — ED Provider Notes (Addendum)
Emergency Medicine Provider Triage Evaluation Note  Kelsey Zavala , a 45 y.o. female  was evaluated in triage.  Pt complains of multiple complaints.  Patient states 3 days ago she noted that her left arm was heavy.  States it would lock up on her.  States she has had intermittent tingling and numbness to her left arm.  She is also had associated headache or denies any sudden onset thunderclap headache.  She feels like she is having difficulty getting her words out.  She also states she feels like she has chest pain diffusely across her anterior chest.  She denies any back pain.  Chest pain is constant.  No cough.  She does not feel short of breath.  Felt like she could not remember how to get down the stairs earlier today.  No prior history of this.  Review of Systems  Positive: Chest pain, numbness, headache, difficulty with word finding Negative: Short of breath, cough, back pain, urinary complaints  Physical Exam  BP (!) 173/90 (BP Location: Left Arm)   Pulse 85   Temp 98.2 F (36.8 C) (Oral)   Resp 16   Ht 5\' 8"  (1.727 m)   Wt 87.5 kg   LMP 07/07/2020   SpO2 100%   BMI 29.35 kg/m  Gen:   Awake, no distress   Neck:  Range of motion neck without difficulty Resp:  Normal effort  MSK:   Moves extremities without difficulty  Neuro:  Cranial nerves 2-12 grossly intact. Decreased sensation to left radial aspect arm. Other:    Medical Decision Making  Medically screening exam initiated at 7:13 PM.  Appropriate orders placed.  09/07/2020 was informed that the remainder of the evaluation will be completed by another provider, this initial triage assessment does not replace that evaluation, and the importance of remaining in the ED until their evaluation is complete.  Chest pain, numbness, headache, difficulty with word finding  Symptoms began 3 days ago.  Patient is not a code stroke, does not meet LVO criteria.  She appears comfortable in room, no back pain.  Low suspicion for acute  dissection.  Nursing aware patient needs room in back         Marabeth Melland A, PA-C 07/26/20 1920    07/28/20, MD 07/27/20 [phone]

## 2020-07-27 NOTE — ED Notes (Signed)
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

## 2020-12-05 DIAGNOSIS — Z20822 Contact with and (suspected) exposure to covid-19: Secondary | ICD-10-CM | POA: Diagnosis not present

## 2020-12-05 DIAGNOSIS — U071 COVID-19: Secondary | ICD-10-CM | POA: Diagnosis not present

## 2021-01-28 ENCOUNTER — Ambulatory Visit (HOSPITAL_BASED_OUTPATIENT_CLINIC_OR_DEPARTMENT_OTHER): Payer: Medicaid Other | Admitting: Family Medicine

## 2021-02-06 ENCOUNTER — Ambulatory Visit: Payer: Medicaid Other | Admitting: Family Medicine

## 2021-02-07 ENCOUNTER — Encounter (HOSPITAL_BASED_OUTPATIENT_CLINIC_OR_DEPARTMENT_OTHER): Payer: Self-pay | Admitting: Family Medicine

## 2021-03-15 DIAGNOSIS — Z01419 Encounter for gynecological examination (general) (routine) without abnormal findings: Secondary | ICD-10-CM | POA: Diagnosis not present

## 2021-03-15 DIAGNOSIS — Z0000 Encounter for general adult medical examination without abnormal findings: Secondary | ICD-10-CM | POA: Diagnosis not present

## 2021-03-15 DIAGNOSIS — Z1231 Encounter for screening mammogram for malignant neoplasm of breast: Secondary | ICD-10-CM | POA: Diagnosis not present

## 2021-03-15 DIAGNOSIS — N898 Other specified noninflammatory disorders of vagina: Secondary | ICD-10-CM | POA: Diagnosis not present

## 2021-03-15 DIAGNOSIS — Z13228 Encounter for screening for other metabolic disorders: Secondary | ICD-10-CM | POA: Diagnosis not present

## 2021-04-14 DIAGNOSIS — Z1231 Encounter for screening mammogram for malignant neoplasm of breast: Secondary | ICD-10-CM | POA: Diagnosis not present

## 2021-09-15 DIAGNOSIS — R110 Nausea: Secondary | ICD-10-CM | POA: Diagnosis not present

## 2021-09-15 DIAGNOSIS — M545 Low back pain, unspecified: Secondary | ICD-10-CM | POA: Diagnosis not present

## 2021-09-15 DIAGNOSIS — N390 Urinary tract infection, site not specified: Secondary | ICD-10-CM | POA: Diagnosis not present

## 2021-09-15 DIAGNOSIS — K529 Noninfective gastroenteritis and colitis, unspecified: Secondary | ICD-10-CM | POA: Diagnosis not present

## 2022-05-26 DIAGNOSIS — M549 Dorsalgia, unspecified: Secondary | ICD-10-CM | POA: Diagnosis not present

## 2022-05-26 DIAGNOSIS — G44209 Tension-type headache, unspecified, not intractable: Secondary | ICD-10-CM | POA: Diagnosis not present

## 2022-05-26 DIAGNOSIS — R519 Headache, unspecified: Secondary | ICD-10-CM | POA: Diagnosis not present

## 2022-05-26 DIAGNOSIS — R350 Frequency of micturition: Secondary | ICD-10-CM | POA: Diagnosis not present

## 2022-05-26 DIAGNOSIS — Z79899 Other long term (current) drug therapy: Secondary | ICD-10-CM | POA: Diagnosis not present

## 2022-05-26 DIAGNOSIS — Z91048 Other nonmedicinal substance allergy status: Secondary | ICD-10-CM | POA: Diagnosis not present

## 2022-05-26 DIAGNOSIS — W2209XA Striking against other stationary object, initial encounter: Secondary | ICD-10-CM | POA: Diagnosis not present

## 2022-05-26 DIAGNOSIS — I10 Essential (primary) hypertension: Secondary | ICD-10-CM | POA: Diagnosis not present

## 2022-05-26 DIAGNOSIS — Z888 Allergy status to other drugs, medicaments and biological substances status: Secondary | ICD-10-CM | POA: Diagnosis not present

## 2022-05-26 DIAGNOSIS — R3915 Urgency of urination: Secondary | ICD-10-CM | POA: Diagnosis not present

## 2022-05-26 DIAGNOSIS — R109 Unspecified abdominal pain: Secondary | ICD-10-CM | POA: Diagnosis not present

## 2022-05-29 DIAGNOSIS — Z124 Encounter for screening for malignant neoplasm of cervix: Secondary | ICD-10-CM | POA: Diagnosis not present

## 2022-05-29 DIAGNOSIS — R102 Pelvic and perineal pain: Secondary | ICD-10-CM | POA: Diagnosis not present

## 2022-07-06 DIAGNOSIS — I10 Essential (primary) hypertension: Secondary | ICD-10-CM | POA: Diagnosis not present

## 2022-07-30 DIAGNOSIS — R030 Elevated blood-pressure reading, without diagnosis of hypertension: Secondary | ICD-10-CM | POA: Diagnosis not present

## 2022-07-30 DIAGNOSIS — F411 Generalized anxiety disorder: Secondary | ICD-10-CM | POA: Diagnosis not present

## 2022-08-05 DIAGNOSIS — J069 Acute upper respiratory infection, unspecified: Secondary | ICD-10-CM | POA: Diagnosis not present

## 2023-02-04 DIAGNOSIS — N920 Excessive and frequent menstruation with regular cycle: Secondary | ICD-10-CM | POA: Diagnosis not present

## 2023-02-04 DIAGNOSIS — N939 Abnormal uterine and vaginal bleeding, unspecified: Secondary | ICD-10-CM | POA: Diagnosis not present

## 2023-02-04 DIAGNOSIS — R5383 Other fatigue: Secondary | ICD-10-CM | POA: Diagnosis not present
